# Patient Record
Sex: Male | Born: 1950 | Race: White | Hispanic: No | Marital: Married | Smoking: Former smoker
Health system: Southern US, Community
[De-identification: ages and names within clinical notes are randomized; demographics above are authoritative.]

## PROBLEM LIST (undated history)

## (undated) DIAGNOSIS — I712 Thoracic aortic aneurysm, without rupture, unspecified: Secondary | ICD-10-CM

## (undated) DIAGNOSIS — E785 Hyperlipidemia, unspecified: Secondary | ICD-10-CM

## (undated) DIAGNOSIS — G4730 Sleep apnea, unspecified: Secondary | ICD-10-CM

## (undated) DIAGNOSIS — R600 Localized edema: Secondary | ICD-10-CM

## (undated) DIAGNOSIS — Q899 Congenital malformation, unspecified: Secondary | ICD-10-CM

## (undated) DIAGNOSIS — I10 Essential (primary) hypertension: Secondary | ICD-10-CM

## (undated) DIAGNOSIS — M1990 Unspecified osteoarthritis, unspecified site: Secondary | ICD-10-CM

## (undated) DIAGNOSIS — E669 Obesity, unspecified: Secondary | ICD-10-CM

## (undated) DIAGNOSIS — G4733 Obstructive sleep apnea (adult) (pediatric): Secondary | ICD-10-CM

## (undated) DIAGNOSIS — I77810 Thoracic aortic ectasia: Secondary | ICD-10-CM

## (undated) DIAGNOSIS — T790XXA Air embolism (traumatic), initial encounter: Secondary | ICD-10-CM

## (undated) DIAGNOSIS — I700 Atherosclerosis of aorta: Secondary | ICD-10-CM

## (undated) DIAGNOSIS — K5792 Diverticulitis of intestine, part unspecified, without perforation or abscess without bleeding: Secondary | ICD-10-CM

## (undated) HISTORY — DX: Obesity, unspecified: E66.9

## (undated) HISTORY — DX: Hyperlipidemia, unspecified: E78.5

## (undated) HISTORY — DX: Diverticulitis of intestine, part unspecified, without perforation or abscess without bleeding: K57.92

## (undated) HISTORY — DX: Congenital malformation, unspecified: Q89.9

## (undated) HISTORY — DX: Unspecified osteoarthritis, unspecified site: M19.90

## (undated) HISTORY — DX: Atherosclerosis of aorta: I70.0

## (undated) HISTORY — DX: Sleep apnea, unspecified: G47.30

## (undated) HISTORY — DX: Essential (primary) hypertension: I10

## (undated) HISTORY — PX: COLONOSCOPY: SHX174

## (undated) HISTORY — DX: Thoracic aortic aneurysm, without rupture, unspecified: I71.20

## (undated) HISTORY — PX: BACK SURGERY: SHX140

## (undated) HISTORY — DX: Localized edema: R60.0

## (undated) HISTORY — DX: Obstructive sleep apnea (adult) (pediatric): G47.33

---

## 2006-07-11 ENCOUNTER — Emergency Department: Payer: Self-pay | Admitting: Emergency Medicine

## 2006-07-11 ENCOUNTER — Other Ambulatory Visit: Payer: Self-pay

## 2006-07-13 ENCOUNTER — Ambulatory Visit: Payer: Self-pay | Admitting: Internal Medicine

## 2006-07-16 ENCOUNTER — Ambulatory Visit: Payer: Self-pay | Admitting: Internal Medicine

## 2006-08-20 ENCOUNTER — Ambulatory Visit: Payer: Self-pay | Admitting: Internal Medicine

## 2006-08-24 HISTORY — PX: THORACENTESIS: SHX235

## 2006-08-27 ENCOUNTER — Ambulatory Visit: Payer: Self-pay | Admitting: Internal Medicine

## 2006-08-28 ENCOUNTER — Ambulatory Visit: Payer: Self-pay | Admitting: Internal Medicine

## 2006-08-31 ENCOUNTER — Ambulatory Visit: Payer: Self-pay | Admitting: Internal Medicine

## 2006-09-03 ENCOUNTER — Ambulatory Visit: Payer: Self-pay | Admitting: Cardiology

## 2006-09-03 ENCOUNTER — Ambulatory Visit: Payer: Self-pay | Admitting: Critical Care Medicine

## 2006-09-08 ENCOUNTER — Ambulatory Visit (HOSPITAL_COMMUNITY): Admission: RE | Admit: 2006-09-08 | Discharge: 2006-09-08 | Payer: Self-pay | Admitting: Critical Care Medicine

## 2006-09-08 ENCOUNTER — Encounter (INDEPENDENT_AMBULATORY_CARE_PROVIDER_SITE_OTHER): Payer: Self-pay | Admitting: *Deleted

## 2006-09-21 ENCOUNTER — Ambulatory Visit: Payer: Self-pay | Admitting: Critical Care Medicine

## 2006-09-21 ENCOUNTER — Encounter: Payer: Self-pay | Admitting: Internal Medicine

## 2006-10-02 ENCOUNTER — Encounter: Admission: RE | Admit: 2006-10-02 | Discharge: 2006-10-02 | Payer: Self-pay | Admitting: Thoracic Surgery

## 2006-11-25 ENCOUNTER — Encounter: Admission: RE | Admit: 2006-11-25 | Discharge: 2006-11-25 | Payer: Self-pay | Admitting: Thoracic Surgery

## 2007-01-28 ENCOUNTER — Ambulatory Visit: Payer: Self-pay | Admitting: Internal Medicine

## 2007-02-09 ENCOUNTER — Ambulatory Visit: Payer: Self-pay | Admitting: Gastroenterology

## 2007-02-26 ENCOUNTER — Ambulatory Visit: Payer: Self-pay | Admitting: Gastroenterology

## 2007-02-26 LAB — HM COLONOSCOPY

## 2007-03-09 DIAGNOSIS — I10 Essential (primary) hypertension: Secondary | ICD-10-CM

## 2007-03-09 DIAGNOSIS — K5730 Diverticulosis of large intestine without perforation or abscess without bleeding: Secondary | ICD-10-CM | POA: Insufficient documentation

## 2007-03-09 DIAGNOSIS — E785 Hyperlipidemia, unspecified: Secondary | ICD-10-CM

## 2007-07-15 ENCOUNTER — Telehealth: Payer: Self-pay | Admitting: Family Medicine

## 2007-07-15 ENCOUNTER — Ambulatory Visit: Payer: Self-pay | Admitting: Family Medicine

## 2007-08-02 ENCOUNTER — Ambulatory Visit: Payer: Self-pay | Admitting: Internal Medicine

## 2007-08-31 ENCOUNTER — Telehealth (INDEPENDENT_AMBULATORY_CARE_PROVIDER_SITE_OTHER): Payer: Self-pay | Admitting: *Deleted

## 2008-01-25 ENCOUNTER — Ambulatory Visit: Payer: Self-pay | Admitting: Internal Medicine

## 2008-01-26 ENCOUNTER — Ambulatory Visit: Payer: Self-pay | Admitting: Internal Medicine

## 2008-01-27 ENCOUNTER — Telehealth (INDEPENDENT_AMBULATORY_CARE_PROVIDER_SITE_OTHER): Payer: Self-pay | Admitting: *Deleted

## 2008-01-27 LAB — CONVERTED CEMR LAB
Albumin: 4 g/dL (ref 3.5–5.2)
BUN: 24 mg/dL — ABNORMAL HIGH (ref 6–23)
Chloride: 102 meq/L (ref 96–112)
GFR calc Af Amer: 73 mL/min
GFR calc non Af Amer: 61 mL/min
HDL: 39.5 mg/dL (ref >39.0)
Phosphorus: 3.3 mg/dL (ref 2.3–4.6)
Sodium: 139 meq/L (ref 135–145)
Triglycerides: 116 mg/dL (ref 0–149)
VLDL: 23 mg/dL (ref 0–40)

## 2008-02-16 ENCOUNTER — Encounter: Payer: Self-pay | Admitting: Internal Medicine

## 2008-02-28 ENCOUNTER — Ambulatory Visit: Payer: Self-pay | Admitting: Internal Medicine

## 2008-02-28 LAB — CONVERTED CEMR LAB
ALT: 38 units/L (ref 0–53)
LDL Cholesterol: 139 mg/dL — ABNORMAL HIGH (ref 0–99)
VLDL: 17 mg/dL (ref 0–40)

## 2008-08-01 ENCOUNTER — Ambulatory Visit: Payer: Self-pay | Admitting: Internal Medicine

## 2008-08-01 DIAGNOSIS — G4733 Obstructive sleep apnea (adult) (pediatric): Secondary | ICD-10-CM

## 2009-02-09 ENCOUNTER — Ambulatory Visit: Payer: Self-pay | Admitting: Internal Medicine

## 2009-02-12 LAB — CONVERTED CEMR LAB
Albumin: 4.1 g/dL (ref 3.5–5.2)
Alkaline Phosphatase: 69 units/L (ref 39–117)
Basophils Relative: 1.1 % (ref 0.0–3.0)
Bilirubin, Direct: 0 mg/dL (ref 0.0–0.3)
Eosinophils Absolute: 0.2 10*3/uL (ref 0.0–0.7)
HCT: 43.7 % (ref 39.0–52.0)
Hemoglobin: 15.5 g/dL (ref 13.0–17.0)
LDL Cholesterol: 124 mg/dL — ABNORMAL HIGH (ref 0–99)
MCHC: 35.5 g/dL (ref 30.0–36.0)
MCV: 89.3 fL (ref 78.0–100.0)
Monocytes Absolute: 0.8 10*3/uL (ref 0.1–1.0)
Neutro Abs: 3.3 10*3/uL (ref 1.4–7.7)
PSA: 1.66 ng/mL (ref 0.10–4.00)
RBC: 4.89 M/uL (ref 4.22–5.81)
Total CHOL/HDL Ratio: 5
Triglycerides: 87 mg/dL (ref 0.0–149.0)

## 2009-08-07 ENCOUNTER — Ambulatory Visit: Payer: Self-pay | Admitting: Internal Medicine

## 2010-02-12 ENCOUNTER — Ambulatory Visit: Payer: Self-pay | Admitting: Internal Medicine

## 2010-02-14 LAB — CONVERTED CEMR LAB
BUN: 17 mg/dL (ref 6–23)
Basophils Relative: 1 % (ref 0.0–3.0)
Bilirubin, Direct: 0.1 mg/dL (ref 0.0–0.3)
CO2: 32 meq/L (ref 19–32)
Chloride: 105 meq/L (ref 96–112)
Direct LDL: 149.5 mg/dL
Eosinophils Relative: 3.3 % (ref 0.0–5.0)
GFR calc non Af Amer: 65.95 mL/min (ref >60)
HCT: 46.3 % (ref 39.0–52.0)
HDL: 50.1 mg/dL (ref >39.00)
Hemoglobin: 15.4 g/dL (ref 13.0–17.0)
Lymphs Abs: 1.5 10*3/uL (ref 0.7–4.0)
Monocytes Relative: 10.6 % (ref 3.0–12.0)
Neutro Abs: 4.6 10*3/uL (ref 1.4–7.7)
RBC: 4.95 M/uL (ref 4.22–5.81)
RDW: 12.6 % (ref 11.5–14.6)
Total Bilirubin: 1.1 mg/dL (ref 0.3–1.2)
Total CHOL/HDL Ratio: 4
VLDL: 30 mg/dL (ref 0.0–40.0)

## 2010-08-16 ENCOUNTER — Ambulatory Visit: Payer: Self-pay | Admitting: Internal Medicine

## 2010-12-22 LAB — CONVERTED CEMR LAB
Basophils Absolute: 0.1 10*3/uL (ref 0.0–0.1)
Basophils Relative: 0.8 % (ref 0.0–1.0)
Eosinophils Absolute: 0.2 10*3/uL (ref 0.0–0.6)
Eosinophils Relative: 3.3 % (ref 0.0–5.0)
HCT: 42.3 % (ref 39.0–52.0)
Hemoglobin: 14.2 g/dL (ref 13.0–17.0)
Lymphocytes Relative: 25.1 % (ref 12.0–46.0)
MCHC: 33.6 g/dL (ref 30.0–36.0)
MCV: 90.4 fL (ref 78.0–100.0)
Monocytes Absolute: 0.8 10*3/uL — ABNORMAL HIGH (ref 0.2–0.7)
Monocytes Relative: 10.9 % (ref 3.0–11.0)
Neutro Abs: 4.1 10*3/uL (ref 1.4–7.7)
Neutrophils Relative %: 59.9 % (ref 43.0–77.0)
Platelets: 182 10*3/uL (ref 150–400)
RBC: 4.68 M/uL (ref 4.22–5.81)
RDW: 12.2 % (ref 11.5–14.6)
WBC: 7 10*3/uL (ref 4.5–10.5)

## 2010-12-26 NOTE — Assessment & Plan Note (Signed)
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  Past Medical History: Reviewed history from 08/01/2008 and no changes required. Diverticulosis, colon 02/2007 Hyperlipidemia Hypertension Obstructive sleep apnea  Past Surgical History: Pneumonia (out pt)  06/2006 Left pleural effusion-thoracentesis  08/2006  Family History: Dad has had MI and CABG   ESRD. Stroke 26-Nov-2023 and died Mar 28, 2023 Mom died of renal failure @80 -- had HTN  Prostate cancer --Dad No DM No colon cancer  Social History: Reviewed history from 08/07/2009 and no changes required. 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 Eyes:  Denies double vision and vision loss-1 eye. ENT:  Denies decreased hearing and ringing in ears; teeth okay--regular with  dentist. CV:  Complains of shortness of breath with exertion; denies chest pain or discomfort, difficulty breathing at night, difficulty breathing while lying down, fainting, lightheadness, and palpitations; stamina is down but no acute change. Resp:  Denies cough and shortness of breath. GI:  Denies abdominal pain, bloody stools, change in bowel habits, constipation, dark tarry stools, indigestion, nausea, and vomiting. GU:  Denies erectile dysfunction, urinary frequency, and urinary hesitancy. MS:  See HPI; Denies joint pain and joint swelling; neck improved. Derm:  Denies lesion(s) and rash. Neuro:  See HPI; Complains of numbness and tingling; denies headaches and weakness. Psych:  Denies anxiety and depression. Heme:  Denies abnormal bruising and enlarge lymph nodes. Allergy:  Denies seasonal allergies and sneezing.  Physical Exam  General:  alert and normal appearance.   Eyes:  pupils equal, pupils round, pupils reactive to light, and no optic disk abnormalities.   Ears:  R ear normal and L ear normal.   Mouth:  no erythema and no lesions.   Neck:  supple, no masses, no thyromegaly, no carotid bruits, and no cervical lymphadenopathy.   Lungs:  normal respiratory effort and normal breath sounds.   Heart:  normal rate, regular rhythm, no murmur, and no gallop.   Abdomen:  soft and non-tender.   Rectal:  no hemorrhoids and no masses.   Prostate:  no gland enlargement and no nodules.   Msk:  no joint tenderness and no joint swelling.   Pulses:  1+ in feet Extremities:  no edema  Neurologic:  alert & oriented X3, strength normal in all extremities, and gait normal.   Skin:  no rashes and no suspicious lesions.   Multiple benign nevi and skin tags Axillary Nodes:  No palpable lymphadenopathy Psych:  normally interactive, good eye contact, not anxious appearing, and not depressed appearing.     Impression & Recommendations:  Problem # 1:  PREVENTIVE HEALTH CARE  (ICD-V70.0) Assessment Comment Only due for PSA discussed fitness  Problem # 2:  HYPERTENSION (ICD-401.9) Assessment: Unchanged  good control will check labs  His updated medication list for this problem includes:    Prinzide 10-12.5 Mg Tabs (Lisinopril-hydrochlorothiazide) .Marland Kitchen... Take one tab daily  BP today: 118/60 Prior BP: 110/70 (08/07/2009)  Labs Reviewed: K+: 4.2 (02/09/2009) Creat: : 1.5 (02/09/2009)   Chol: 177 (02/09/2009)   HDL: 35.60 (02/09/2009)   LDL: 124 (02/09/2009)   TG: 87.0 (02/09/2009)  Orders: TLB-Renal Function Panel (80069-RENAL) TLB-CBC Platelet - w/Differential (85025-CBCD) TLB-TSH (Thyroid Stimulating Hormone) (84443-TSH) Venipuncture (16109)  Problem # 3:  HYPERLIPIDEMIA (ICD-272.4) Assessment: Unchanged  no problems with med will continue and check labs  His updated medication list for this problem includes:    Pravastatin Sodium 40 Mg Tabs (Pravastatin sodium) .Marland Kitchen... Take one by mouth daily  Labs Reviewed: SGOT: 22 (02/09/2009)   SGPT: 18 (02/09/2009)   HDL:35.60 (02/09/2009), 42.5 (02/28/2008)  LDL:124 (02/09/2009), 139 (02/28/2008)  Chol:177 (02/09/2009), 198 (02/28/2008)  Trig:87.0 (02/09/2009), 85 (02/28/2008)  Orders: TLB-Lipid Panel (80061-LIPID) TLB-Hepatic/Liver Function Pnl (80076-HEPATIC)  Complete Medication List: 1)  Prinzide 10-12.5 Mg Tabs (Lisinopril-hydrochlorothiazide) .... Take one tab daily 2)  Adult Aspirin Ec Low Strength 81 Mg Tbec (Aspirin) .... Once daily 3)  Pravastatin Sodium 40 Mg Tabs (Pravastatin sodium) .... Take one by mouth daily  Other Orders: TLB-PSA (Prostate Specific Antigen) (84153-PSA)  Patient Instructions: 1)  Please schedule a follow-up appointment in 6 months .  Prescriptions: PRAVASTATIN SODIUM 40 MG TABS (PRAVASTATIN SODIUM) Take one by mouth daily  #30 x 12   Entered by:   Mervin Hack CMA (AAMA)   Authorized by:   Cindee Salt MD   Signed by:   Mervin Hack CMA (AAMA) on  02/12/2010   Method used:   Electronically to        [facility]* (retail)       [address] [place], [place]  [address]       Ph: [phone]       Fax: [phone]   RxID:   [id number]   Current Allergies (reviewed today): No known allergies

## 2010-12-26 NOTE — Assessment & Plan Note (Signed)
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  Past Medical History: Reviewed history from 08/01/2008 and no changes required. Diverticulosis, colon 02/2007 Hyperlipidemia Hypertension Obstructive sleep apnea  Past Surgical History: Reviewed history from 02/12/2010 and no changes required. Pneumonia (out pt)  06/2006 Left pleural effusion-thoracentesis  08/2006  Family History: Reviewed history from 02/12/2010 and no changes required. Dad has had MI and CABG   ESRD. Stroke 11-10-2023 and died 2023/03/12 Mom died of renal failure @80 -- had HTN  Prostate cancer --Dad No DM No colon cancer  Social History: Reviewed history from 08/07/2009 and no changes required. 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   Neck:  supple, no masses, no thyromegaly, no carotid bruits, and no cervical lymphadenopathy.   Lungs:  normal respiratory effort, no intercostal retractions, no accessory muscle use, and normal breath sounds.   Heart:  normal rate, regular rhythm, no murmur, and no gallop.   Msk:  no joint tenderness and no joint swelling.   Extremities:  no edema Psych:  normally interactive, good eye contact, not anxious appearing, and not depressed appearing.     Impression & Recommendations:  Problem # 1:  HYPERTENSION (ICD-401.9) Assessment Unchanged good control no changes needed  His updated medication list for this problem includes:    Prinzide 10-12.5 Mg Tabs (Lisinopril-hydrochlorothiazide) .Marland Kitchen... Take one tab daily  BP today: 126/74 Prior BP: 118/60 (02/12/2010)  Labs Reviewed: K+: 4.4 (02/12/2010) Creat: : 1.2 (02/12/2010)   Chol: 215 (02/12/2010)   HDL: 50.10 (02/12/2010)   LDL: 124 (02/09/2009)   TG: 150.0 (02/12/2010)  Problem # 2:  HYPERLIPIDEMIA (ICD-272.4) Assessment: Unchanged not great but discussed working on lifestyle will recheck at PE  His updated medication list for this problem includes:    Pravastatin Sodium 40 Mg Tabs (Pravastatin sodium) .Marland Kitchen... Take one by mouth daily  Labs Reviewed: SGOT: 21 (02/12/2010)   SGPT: 27 (02/12/2010)   HDL:50.10 (02/12/2010), 35.60 (02/09/2009)  LDL:124 (02/09/2009), 139 (02/28/2008)  Chol:215 (02/12/2010), 177 (02/09/2009)  Trig:150.0 (02/12/2010), 87.0 (02/09/2009)  Problem # 3:  GRIEF REACTION (ICD-309.0) Assessment: Improved better with wife's death but now has to close business discussed his future/social interacitons, etc  Complete Medication List: 1)  Prinzide 10-12.5 Mg Tabs (Lisinopril-hydrochlorothiazide) .... Take one tab daily 2)  Adult Aspirin Ec Low Strength 81 Mg Tbec (Aspirin) .... Once daily 3)  Pravastatin Sodium 40 Mg Tabs (Pravastatin sodium) .... Take one by  mouth daily  Patient Instructions: 1)  Please schedule a follow-up appointment in 6 months for physical  Current Allergies (reviewed today): No known allergies   Appended Document: FOLLOW UP / LFW     Allergies: No Known Drug Allergies   Complete Medication List: 1)  Prinzide 10-12.5 Mg Tabs (Lisinopril-hydrochlorothiazide) .... Take one tab daily 2)  Adult Aspirin Ec Low Strength 81 Mg Tbec (Aspirin) .... Once daily 3)  Pravastatin Sodium 40 Mg Tabs (Pravastatin sodium) .... Take one by mouth daily  Other Orders: Flu Vaccine 87yrs + (04540) Admin 1st Vaccine (98119)    Immunizations Administered:  Influenza Vaccine # 1:    Vaccine Type: Fluvax 3+    Site: left deltoid    Mfr: GlaxoSmithKline    Dose: 0.5 ml    Route: IM    Given by: Linde Gillis CMA (AAMA)    Exp. Date: 05/24/2011    Lot #: [id number]    VIS given: 06/18/10 version given August 16, 2010.

## 2011-02-03 ENCOUNTER — Encounter (INDEPENDENT_AMBULATORY_CARE_PROVIDER_SITE_OTHER): Payer: PRIVATE HEALTH INSURANCE | Admitting: Internal Medicine

## 2011-02-03 ENCOUNTER — Other Ambulatory Visit: Payer: Self-pay | Admitting: Internal Medicine

## 2011-02-03 ENCOUNTER — Encounter: Payer: Self-pay | Admitting: Internal Medicine

## 2011-02-03 DIAGNOSIS — I10 Essential (primary) hypertension: Secondary | ICD-10-CM

## 2011-02-03 DIAGNOSIS — Z0000 Encounter for general adult medical examination without abnormal findings: Secondary | ICD-10-CM

## 2011-02-03 DIAGNOSIS — E785 Hyperlipidemia, unspecified: Secondary | ICD-10-CM

## 2011-02-03 LAB — LIPID PANEL
Cholesterol: 235 mg/dL — ABNORMAL HIGH (ref 0–200)
HDL: 50.7 mg/dL (ref >39.00)
Total CHOL/HDL Ratio: 5
Triglycerides: 94 mg/dL (ref 0.0–149.0)
VLDL: 18.8 mg/dL (ref 0.0–40.0)

## 2011-02-03 LAB — CBC WITH DIFFERENTIAL/PLATELET
Basophils Absolute: 0 10*3/uL (ref 0.0–0.1)
Basophils Relative: 0.6 % (ref 0.0–3.0)
Eosinophils Absolute: 0.2 10*3/uL (ref 0.0–0.7)
Eosinophils Relative: 2.2 % (ref 0.0–5.0)
HCT: 47.7 % (ref 39.0–52.0)
Hemoglobin: 16.5 g/dL (ref 13.0–17.0)
Lymphocytes Relative: 23.6 % (ref 12.0–46.0)
Lymphs Abs: 1.9 10*3/uL (ref 0.7–4.0)
MCHC: 34.7 g/dL (ref 30.0–36.0)
MCV: 91.4 fl (ref 78.0–100.0)
Monocytes Absolute: 0.7 10*3/uL (ref 0.1–1.0)
Monocytes Relative: 8.7 % (ref 3.0–12.0)
Neutro Abs: 5.4 10*3/uL (ref 1.4–7.7)
Neutrophils Relative %: 64.9 % (ref 43.0–77.0)
Platelets: 190 10*3/uL (ref 150.0–400.0)
RBC: 5.22 Mil/uL (ref 4.22–5.81)
RDW: 13.5 % (ref 11.5–14.6)
WBC: 8.3 10*3/uL (ref 4.5–10.5)

## 2011-02-03 LAB — HEPATIC FUNCTION PANEL
ALT: 22 U/L (ref 0–53)
AST: 20 U/L (ref 0–37)
Albumin: 4.6 g/dL (ref 3.5–5.2)
Alkaline Phosphatase: 62 U/L (ref 39–117)
Bilirubin, Direct: 0.2 mg/dL (ref 0.0–0.3)
Total Bilirubin: 0.7 mg/dL (ref 0.3–1.2)
Total Protein: 7.4 g/dL (ref 6.0–8.3)

## 2011-02-03 LAB — RENAL FUNCTION PANEL
Albumin: 4.6 g/dL (ref 3.5–5.2)
BUN: 23 mg/dL (ref 6–23)
CO2: 28 mEq/L (ref 19–32)
Calcium: 9.4 mg/dL (ref 8.4–10.5)
Chloride: 99 mEq/L (ref 96–112)
Creatinine, Ser: 1.1 mg/dL (ref 0.4–1.5)
GFR: 71.91 mL/min (ref >60.00)
Glucose, Bld: 84 mg/dL (ref 70–99)
Phosphorus: 3.7 mg/dL (ref 2.3–4.6)
Potassium: 4.3 mEq/L (ref 3.5–5.1)
Sodium: 136 mEq/L (ref 135–145)

## 2011-02-03 LAB — LDL CHOLESTEROL, DIRECT: Direct LDL: 161.3 mg/dL

## 2011-02-03 LAB — TSH: TSH: 2.08 u[IU]/mL (ref 0.35–5.50)

## 2011-02-11 NOTE — Assessment & Plan Note (Signed)
Summary: CPX/DLO   Vital Signs:  Patient profile:   60 year old male Weight:      243 pounds O2 Sat:      96 % on Room air Temp:     98.4 degrees F oral Pulse rate:   76 / minute Pulse rhythm:   regular BP sitting:   122 / 88  (left arm) Cuff size:   large  Vitals Entered By: Mervin Hack CMA Duncan Dull) (February 03, 2011 11:57 AM)  O2 Flow:  Room air CC: adult physical   History of Present Illness: Has retired Very busy [employer] has been doing one project after another Some consulting work--but not seeking this out  Has cut out fast food lunches more physically active though no set changes in activity  Twisted right knee in November--throwing a box into truck some aching improving slowly  Has sore area above past burn on medial left calf seems better now  occ ankle edema if up a long time  Allergies: No Known Drug Allergies  Past History:  Past medical, surgical, family and social histories (including risk factors) reviewed for relevance to current acute and chronic problems.  Past Medical History: Reviewed history from 08/01/2008 and no changes required. Diverticulosis, colon 02/2007 Hyperlipidemia Hypertension Obstructive sleep apnea  Past Surgical History: Reviewed history from 02/12/2010 and no changes required. Pneumonia (out pt)  06/2006 Left pleural effusion-thoracentesis  08/2006  Family History: Reviewed history from 02/12/2010 and no changes required. Dad has had MI and CABG   ESRD. 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 Eyes:  Denies double vision and vision loss-1 eye. ENT:  Denies decreased hearing and ringing in ears;  teeth okay--every 4 months to dentist. CV:  Denies chest pain or discomfort, difficulty breathing at night, difficulty breathing while lying down, fainting, lightheadness, palpitations, and shortness of breath with exertion. Resp:  Denies cough and shortness of breath. GI:  Complains of indigestion; denies abdominal pain, bloody stools, change in bowel habits, dark tarry stools, nausea, and vomiting; rare stomach trouble if he eats out---?MSG. GU:  Denies erectile dysfunction, nocturia, urinary frequency, and urinary hesitancy; uses condoms. MS:  See HPI; Complains of joint pain; denies joint swelling; neck pain is controlled with monthly chiropractic. Derm:  Denies lesion(s) and rash. Neuro:  Denies headaches, tingling, and weakness; arms will go to sleep easier in bed--positional. Shakes it out easily. Psych:  Denies anxiety and depression; mood is actually better Incredible relief about mill closing . Heme:  Denies abnormal bruising and enlarge lymph nodes. Allergy:  Denies seasonal allergies and sneezing.  Physical Exam  General:  alert and normal appearance.   Eyes:  pupils equal, pupils round, pupils reactive to light, and no injection.   Ears:  R ear normal and L ear normal.   Mouth:  no erythema, no exudates, and no lesions.   Neck:  supple, no masses, no thyromegaly, and no cervical lymphadenopathy.   Lungs:  normal respiratory effort, no intercostal retractions, no accessory muscle use, and normal breath sounds.   Heart:  normal rate, regular rhythm, no murmur, and no gallop.   Abdomen:  soft, non-tender, and no masses.   Prostate:  deferred after discussion Msk:  no joint tenderness and no joint swelling.   Right knee has normal exam Pulses:  1+ in feet Extremities:  no edema Neurologic:  alert & oriented X3, strength normal in all extremities, and gait normal.   Skin:  no rashes and no suspicious lesions.   Axillary Nodes:  No palpable lymphadenopathy Psych:  normally  interactive, good eye contact, not anxious appearing, and not depressed appearing.     Impression & Recommendations:  Problem # 1:  PREVENTIVE HEALTH CARE (ICD-V70.0) Assessment Comment Only doing well with retirement new lady friend so has adjusted to major life changes discussed fitness Defer PSA since under 2 last year not due for colon for some time  Problem # 2:  HYPERTENSION (ICD-401.9) Assessment: Unchanged  good control no changes needed  His updated medication list for this problem includes:    Prinzide 10-12.5 Mg Tabs (Lisinopril-hydrochlorothiazide) .Marland Kitchen... Take one tab daily  BP today: 122/88 Prior BP: 126/74 (08/16/2010)  Labs Reviewed: K+: 4.4 (02/12/2010) Creat: : 1.2 (02/12/2010)   Chol: 215 (02/12/2010)   HDL: 50.10 (02/12/2010)   LDL: 124 (02/09/2009)   TG: 150.0 (02/12/2010)  Orders: TLB-Renal Function Panel (80069-RENAL) TLB-CBC Platelet - w/Differential (85025-CBCD) TLB-TSH (Thyroid Stimulating Hormone) (84443-TSH) Venipuncture (16109)  Problem # 3:  HYPERLIPIDEMIA (ICD-272.4) Assessment: Unchanged  no problems with meds will recheck labs  His updated medication list for this problem includes:    Pravastatin Sodium 40 Mg Tabs (Pravastatin sodium) .Marland Kitchen... Take one by mouth daily  Labs Reviewed: SGOT: 21 (02/12/2010)   SGPT: 27 (02/12/2010)   HDL:50.10 (02/12/2010), 35.60 (02/09/2009)  LDL:124 (02/09/2009), 139 (02/28/2008)  Chol:215 (02/12/2010), 177 (02/09/2009)  Trig:150.0 (02/12/2010), 87.0 (02/09/2009)  Orders: TLB-Lipid Panel (80061-LIPID) TLB-Hepatic/Liver Function Pnl (80076-HEPATIC)  Problem # 4:  OBSTRUCTIVE SLEEP APNEA (ICD-327.23) Assessment: Unchanged doing well on CPAP  Complete Medication List: 1)  Prinzide 10-12.5 Mg Tabs (Lisinopril-hydrochlorothiazide) .... Take one tab daily 2)  Adult Aspirin Ec Low Strength 81 Mg Tbec (Aspirin) .... Once daily 3)  Pravastatin Sodium 40 Mg Tabs (Pravastatin sodium) .... Take one by mouth  daily  Patient Instructions: 1)  Please schedule a follow-up appointment in 6 months .    Orders Added: 1)  TLB-Lipid Panel [80061-LIPID] 2)  TLB-Hepatic/Liver Function Pnl [80076-HEPATIC] 3)  TLB-Renal Function Panel [80069-RENAL] 4)  TLB-CBC Platelet - w/Differential [85025-CBCD] 5)  TLB-TSH (Thyroid Stimulating Hormone) [84443-TSH] 6)  Venipuncture [60454] 7)  Est. Patient 40-64 years [09811]    Current Allergies (reviewed today): No known allergies

## 2011-03-26 ENCOUNTER — Other Ambulatory Visit: Payer: Self-pay | Admitting: *Deleted

## 2011-03-26 MED ORDER — LISINOPRIL-HYDROCHLOROTHIAZIDE 10-12.5 MG PO TABS: 1.0000 | ORAL_TABLET | Freq: Every day | ORAL | Status: DC

## 2011-04-11 NOTE — Assessment & Plan Note (Signed)
[facility]                               PULMONARY OFFICE NOTE   CORGAN, MORMILE                         MRN:          [id number]  DATE:09/03/2006                            DOB:          [birth year]    CONSULTANT:  Charlcie Cradle. Delford Field, M.D., [facility]   CHIEF COMPLAINT:  Evaluate pneumonia.   HISTORY OF THE PRESENT ILLNESS:  This is a 60 year old within who began  having symptoms at the end of August with the acute onset on the right-sided  pleuritic chest pain and increased dyspnea, but no fever, chills or sweats,  and no cough.  The pain was worse with a deep breath.  He was taken to the  emergency department where a chest x-ray showed an infiltrate in the right  lower lobe.  A CT scan of the chest showed no pulmonary emboli.  He was  treated with Levaquin 500 mg a day for 10 days.  He felt somewhat better.  He got an additional three more days of Levaquin per Dr. Alphonsus Sias and was  followed up on August 22, 2006.  Repeat chest x-ray showed clearance of  infiltrate.  He thought he was back to normal, but later that night he  redeveloped pain in the chest in the right lower lung zone.  Another x-ray  was taken showing a persistent infiltrate.  He was given Ketek and another  10 days of Levaquin at 500 mg daily.  He is now finishing those courses of  therapy.  He was also given Vicodin and Darvocet that has helped with the  pain somewhat, but it is still present.  He had to sleep sitting up in a  chair.  He had some fever and some chills with the second round and is now  coughing up some mucous, but swallows this. He is short of breath with  exertion and at rest.  He has some loss of appetite.  No real heartburn or  indigestion.   The patient denies any abdominal pain and difficulty swallowing.  There is  no sore throat, tooth or dental problems.  No nasal congestion, sneezing,  itching, headache, anxiety, depression, nor hand or foot swelling.   The patient did smoke a pack a day of cigarettes and had done so for 40  years, but just quit smoking on July 28, 2006.  He works in Designer, fashion/clothing in  an office type environment.   The patient is referred for further evaluation.   PAST HISTORY:  Medical:  1. History of hypertension.  2. No history of heart disease, heart failure, heart dysrhythmias.  3. No history of asthma, emphysema or diabetes.  4. No has of increased cholesterol, stroke, blood clots, cancer,      allergies, or chronic headaches.  5. The patient does have a history of sleep apnea and is on CPAP therapy.   Operative history:  Back surgery only.   ALLERGIES:  No known drug allergies.   CURRENT MEDICATIONS:  1. Prinizide 1 daily.  2. Aspirin 325 mg daily.  3. The patient is  finishing courses of Ketek and Levaquin.   SOCIAL HISTORY:  Smoking as noted above.  He works in Designer, fashion/clothing, office job.  He is married and has children.   FAMILY HISTORY:  Father had end-stage renal disease on dialysis.  Mother had  heart disease as did his father.  Also positive for phlebitis 20 years ago  for the patient and clotting disorder for his father.   REVIEW OF SYSTEMS:  The review of systems is otherwise noncontributory.   PHYSICAL EXAMINATION:  GENERAL APPEARANCE:  This is an obese white male in  no distress.  VITAL SIGNS:  Temp 97.8, blood pressure 130/80, pulse 74 and saturation 95%  on room air.  Weight is 267 pounds.  CHEST:  The chest is shown to be clear, except for rales and decreased  breath sounds.  Consolidated breath sounds with E to A changes in the right  lower lung particularly anteriorly.  No pleural friction rub is detected.  No wheeze or rhonchi noted.  HEART:  Cardiac exam shows a regular rate and rhythm without S3.  Normal S1  and S2.  There is no murmur, rub, heave or gallop.  ABDOMEN:  The abdomen is soft and nontender.  EXTREMITIES:  The extremities show no edema or clubbing.  SKIN:  The skin is  clear.  NEUROLOGIC:  The neurological exam is intact.  HEENT AND NECK:  Head, eyes, ears, nose and throat, neck exam shows no  jugular venous distention and no lymphadenopathy.  Oropharynx is clear.  The  neck is supple.   LABORATORY DATA:  Chest x-ray from recently on August 21, 2006 showed  clearance of infiltrate, but a follow up film on August 27, 2006 did show  recurrent infiltrate in the right lower lung zone. CT scan from July 21, 2006 showed right lower lobe anteriorly based infiltrated, but no pulmonary  emboli, and with atelectasis involving the medial segment, right middle lobe  and right lower lobe.  There is also some minimal atelectasis present in the  left costophrenic angle.  There is a trace right pleural effusion noted.   IMPRESSION:  Recurrent pneumonia, right middle and lower lung zones.  This patient may have been treated with a subtherapeutic dose of Levaquin at  a 500 mg dose level; however, with the smoking history this may explain why  the condition is prolonged.  I doubt there is any bronchial lesion here;  need to rule out pulmonary emboli.   RECOMMENDATIONS:  1. Obtain a CT scan of the chest with pulmonary emboli protocol and also      evaluate for evidence of pleural effusion.  2. Switch over to Factive 320 mg for a 10-day course after the course of      Ketek and Levaquin are complete.  3. Vicodin was refilled.  4. Once the CT scan report is available further recommendations may follow      and my include bronchoscopy.       Charlcie Cradle Delford Field, MD, [facility]      PEW/MedQ  DD:  09/03/2006  DT:  09/05/2006  Job #:  [id number]   cc:   Karie Schwalbe, MD

## 2011-04-11 NOTE — Assessment & Plan Note (Signed)
[facility]                               PULMONARY OFFICE NOTE   RITO, LECOMTE                         MRN:          [id number]  DATE:09/21/2006                            DOB:          [birth year]    Mr. Nicoson returns today in followup. He is a 60 year old white male with a  history of right-sided pleuritic chest pain with right-sided pneumonia and  right pleural effusion. We attempted a thoracentesis on this patient under  ultrasound guidance and this was performed on September 08, 2006, the results  showing 200 mL of serosanguineous fluid aspirated, post thoracentesis films  showed persistent right pleural effusion. The patient still is having some  chest discomfort and shortness of breath but his fever is improved and his  sharp stabbing chest pain is slightly better. He is only on the Prinizide 1  daily and aspirin 325 mg daily.   PHYSICAL EXAMINATION:  VITAL SIGNS:  Temperature 98, blood pressure 120/88,  pulse 78, saturation 98% on room air.  CHEST:  Showed decreased breath sounds and dull to percussion halfway up on  the right.   IMPRESSION:  Loculated right pleural effusion likely parapneumonic in  nature. Pleural fluid studies did reveal exudative effusion and atypical  cells on cytology. I would recommend that this patient undergo video-  assisted thoracoscopic surgery for complete drainage of the right pleural  effusion with chest tube placements. We referred this patient to Dr. Karle Plumber in this regard. We will follow the patient up expectantly with Dr.  Edwyna Shell.     Charlcie Cradle Delford Field, MD, [facility]    PEW/MedQ  DD: 09/21/2006  DT: 09/22/2006  Job #: [id number]   cc:   Ines Bloomer, M.D.  Karie Schwalbe, MD

## 2011-06-05 ENCOUNTER — Other Ambulatory Visit: Payer: Self-pay | Admitting: *Deleted

## 2011-06-05 MED ORDER — PRAVASTATIN SODIUM 40 MG PO TABS: 40.0000 mg | ORAL_TABLET | Freq: Every day | ORAL | Status: DC

## 2011-08-05 ENCOUNTER — Encounter: Payer: Self-pay | Admitting: Internal Medicine

## 2011-08-06 ENCOUNTER — Ambulatory Visit (INDEPENDENT_AMBULATORY_CARE_PROVIDER_SITE_OTHER): Payer: PRIVATE HEALTH INSURANCE | Admitting: Internal Medicine

## 2011-08-06 ENCOUNTER — Encounter: Payer: Self-pay | Admitting: Internal Medicine

## 2011-08-06 VITALS — BP 117/75 | HR 68 | Temp 98.6°F | Ht 74.0 in | Wt 249.0 lb

## 2011-08-06 DIAGNOSIS — Z23 Encounter for immunization: Secondary | ICD-10-CM

## 2011-08-06 DIAGNOSIS — G4733 Obstructive sleep apnea (adult) (pediatric): Secondary | ICD-10-CM

## 2011-08-06 DIAGNOSIS — I10 Essential (primary) hypertension: Secondary | ICD-10-CM

## 2011-08-06 DIAGNOSIS — E785 Hyperlipidemia, unspecified: Secondary | ICD-10-CM

## 2011-08-06 MED ORDER — ATORVASTATIN CALCIUM 40 MG PO TABS: 40.0000 mg | ORAL_TABLET | Freq: Every day | ORAL | Status: DC

## 2011-08-06 NOTE — Patient Instructions (Signed)
Please set up lipid, hepatic profiles here in about 6 weeks (272.4)

## 2011-08-06 NOTE — Assessment & Plan Note (Signed)
Doing well on CPAP 

## 2011-08-06 NOTE — Progress Notes (Signed)
  Subjective:    Patient ID: TIN ENGRAM, male    DOB: February 08, 1951, 60 y.o.   MRN: [id number]  HPI DOing well Still enjoying new life---still refinishing furniture (but hard in the summer in his hot workshop)  Has been playing golf Some yard work No set exercise Did gain some weight---hasn't been careful with eating (weddings, conventions, etc)  Still on statin No myalgias, GI disturbances Reviewed suboptimal results  Doesn't check BP No headaches No chest pain, SOB Does occ get some left ankle swelling if on his feet all day (normal in AM). Has used support socks  Sleeps well Still using CPAP May need new machine --he has 35,000 hours on his Not sure about his pressure setting  Current Outpatient Prescriptions on File Prior to Visit  Medication Sig Dispense Refill  . aspirin 81 MG tablet Take 81 mg by mouth daily.        Marland Kitchen lisinopril-hydrochlorothiazide (PRINZIDE,ZESTORETIC) 10-12.5 MG per tablet Take 1 tablet by mouth daily.  30 tablet  11  . pravastatin (PRAVACHOL) 40 MG tablet Take 1 tablet (40 mg total) by mouth daily.  30 tablet  8    No Known Allergies  Past Medical History  Diagnosis Date  . Diverticulitis   . Hyperlipidemia   . Hypertension   . OSA (obstructive sleep apnea)     Past Surgical History  Procedure Date  . Thoracentesis 08/2006    left pleural effusion    Family History  Problem Relation Age of Onset  . Hypertension Mother   . Heart disease Father   . Stroke Father   . Cancer Father   . Diabetes Neg Hx     History   Social History  . Marital Status: Married    Spouse Name: N/A    Number of Children: 2  . Years of Education: N/A   Occupational History  . retired- part Restaurant manager, fast food    Social History Main Topics  . Smoking status: Former Smoker    Types: Cigarettes  . Smokeless tobacco: Never Used  . Alcohol Use: Yes  . Drug Use: No  . Sexually Active: Not on file   Other Topics Concern  . Not on file   Social  History Narrative  . No narrative on file   Review of Systems Has been happy Appetite is good    Objective:   Physical Exam  Constitutional: He appears well-developed and well-nourished. No distress.  Neck: Normal range of motion. Neck supple. No thyromegaly present.  Cardiovascular: Normal rate, regular rhythm, normal heart sounds and intact distal pulses.  Exam reveals no gallop.   No murmur heard. Pulmonary/Chest: Effort normal and breath sounds normal. No respiratory distress. He has no wheezes. He has no rales.  Musculoskeletal: Normal range of motion. He exhibits no edema and no tenderness.  Lymphadenopathy:    He has no cervical adenopathy.  Psychiatric: He has a normal mood and affect. His behavior is normal. Judgment and thought content normal.          Assessment & Plan:

## 2011-08-06 NOTE — Assessment & Plan Note (Signed)
BP Readings from Last 3 Encounters:  08/06/11 117/75  02/03/11 122/88  08/16/10 126/74   Good control  No changes needed Lab Results  Component Value Date   CREATININE 1.1 02/03/2011

## 2011-08-06 NOTE — Assessment & Plan Note (Signed)
Lab Results  Component Value Date   LDLCALC 124* 02/09/2009   Last actual LDL ~170 Will change to atorvastatin and check labs in 6 weeks

## 2011-09-17 ENCOUNTER — Other Ambulatory Visit (INDEPENDENT_AMBULATORY_CARE_PROVIDER_SITE_OTHER): Payer: PRIVATE HEALTH INSURANCE

## 2011-09-17 DIAGNOSIS — Z125 Encounter for screening for malignant neoplasm of prostate: Secondary | ICD-10-CM

## 2011-09-17 DIAGNOSIS — E785 Hyperlipidemia, unspecified: Secondary | ICD-10-CM

## 2011-09-17 DIAGNOSIS — I10 Essential (primary) hypertension: Secondary | ICD-10-CM

## 2011-09-17 LAB — HEPATIC FUNCTION PANEL
ALT: 38 U/L (ref 0–53)
AST: 31 U/L (ref 0–37)
Alkaline Phosphatase: 62 U/L (ref 39–117)
Bilirubin, Direct: 0.1 mg/dL (ref 0.0–0.3)
Total Bilirubin: 0.6 mg/dL (ref 0.3–1.2)
Total Protein: 7.2 g/dL (ref 6.0–8.3)

## 2011-09-17 LAB — LIPID PANEL
Cholesterol: 177 mg/dL (ref 0–200)
VLDL: 10.4 mg/dL (ref 0.0–40.0)

## 2011-09-17 LAB — CBC WITH DIFFERENTIAL/PLATELET
Basophils Absolute: 0.1 10*3/uL (ref 0.0–0.1)
Basophils Relative: 0.9 % (ref 0.0–3.0)
Eosinophils Absolute: 0.4 10*3/uL (ref 0.0–0.7)
Lymphocytes Relative: 27.6 % (ref 12.0–46.0)
MCHC: 34.5 g/dL (ref 30.0–36.0)
MCV: 91.7 fl (ref 78.0–100.0)
Monocytes Absolute: 0.7 10*3/uL (ref 0.1–1.0)
Neutrophils Relative %: 58.5 % (ref 43.0–77.0)
Platelets: 203 10*3/uL (ref 150.0–400.0)
RBC: 4.82 Mil/uL (ref 4.22–5.81)
RDW: 13.4 % (ref 11.5–14.6)

## 2011-09-17 LAB — TSH: TSH: 1.89 u[IU]/mL (ref 0.35–5.50)

## 2011-09-17 LAB — BASIC METABOLIC PANEL
BUN: 27 mg/dL — ABNORMAL HIGH (ref 6–23)
Chloride: 107 mEq/L (ref 96–112)
GFR: 78.23 mL/min (ref >60.00)
Potassium: 4.7 mEq/L (ref 3.5–5.1)

## 2011-09-17 LAB — PSA: PSA: 1.78 ng/mL (ref 0.10–4.00)

## 2011-09-17 NOTE — Progress Notes (Signed)
Addended by: Alvina Chou on: 09/17/2011 08:47 AM   Modules accepted: Orders

## 2011-12-16 ENCOUNTER — Telehealth: Payer: Self-pay | Admitting: Internal Medicine

## 2011-12-16 ENCOUNTER — Encounter: Payer: Self-pay | Admitting: Internal Medicine

## 2011-12-16 ENCOUNTER — Ambulatory Visit (INDEPENDENT_AMBULATORY_CARE_PROVIDER_SITE_OTHER): Payer: PRIVATE HEALTH INSURANCE | Admitting: Internal Medicine

## 2011-12-16 VITALS — BP 110/80 | HR 66 | Temp 98.2°F | Ht 74.0 in | Wt 261.0 lb

## 2011-12-16 DIAGNOSIS — J209 Acute bronchitis, unspecified: Secondary | ICD-10-CM | POA: Insufficient documentation

## 2011-12-16 MED ORDER — HYDROCODONE-HOMATROPINE 5-1.5 MG PO TABS: 1.0000 | ORAL_TABLET | Freq: Every evening | ORAL | Status: DC | PRN

## 2011-12-16 MED ORDER — AMOXICILLIN 500 MG PO TABS: 1000.0000 mg | ORAL_TABLET | Freq: Two times a day (BID) | ORAL | Status: AC

## 2011-12-16 NOTE — Assessment & Plan Note (Signed)
Given time course, it is possible he has secondary bacterial infection---though not clear cut Will Rx amoxil and cough med

## 2011-12-16 NOTE — Progress Notes (Signed)
  Subjective:    Patient ID: Russell Garrett, male    DOB: 1951-08-12, 61 y.o.   MRN: [id number]  HPI "I feel like hell" Has been sick for 2 weeks Dry cough---keeping him from sleep Not much congestion or drainage Only slight sore throat  Had sense last week "like someone sitting on my chest" Still more in his chest than head No fever No SOB No ear pain  Used mucinex and other OTC meds---no help nyquil not even helping sleep  Current Outpatient Prescriptions on File Prior to Visit  Medication Sig Dispense Refill  . aspirin 81 MG tablet Take 81 mg by mouth daily.        Marland Kitchen atorvastatin (LIPITOR) 40 MG tablet Take 1 tablet (40 mg total) by mouth daily.  30 tablet  11  . lisinopril-hydrochlorothiazide (PRINZIDE,ZESTORETIC) 10-12.5 MG per tablet Take 1 tablet by mouth daily.  30 tablet  11    No Known Allergies  Past Medical History  Diagnosis Date  . Diverticulitis   . Hyperlipidemia   . Hypertension   . OSA (obstructive sleep apnea)     Past Surgical History  Procedure Date  . Thoracentesis 08/2006    left pleural effusion    Family History  Problem Relation Age of Onset  . Hypertension Mother   . Heart disease Father   . Stroke Father   . Cancer Father   . Diabetes Neg Hx     History   Social History  . Marital Status: Married    Spouse Name: N/A    Number of Children: 2  . Years of Education: N/A   Occupational History  . retired- part Restaurant manager, fast food    Social History Main Topics  . Smoking status: Former Smoker    Types: Cigarettes  . Smokeless tobacco: Never Used  . Alcohol Use: Yes  . Drug Use: No  . Sexually Active: Not on file   Other Topics Concern  . Not on file   Social History Narrative  . No narrative on file   Review of Systems No vomiting or diarrhea (though slightly loose) Appetite fair No rash     Objective:   Physical Exam  Constitutional: He appears well-developed and well-nourished. No distress.  HENT:       No  sinus tenderness Moderate nasal inflammation Very slight pharyngeal injection TMs normal  Neck: Normal range of motion. Neck supple.  Pulmonary/Chest: Effort normal and breath sounds normal. No respiratory distress. He has no wheezes. He has no rales.  Lymphadenopathy:    He has no cervical adenopathy.          Assessment & Plan:

## 2011-12-16 NOTE — Telephone Encounter (Signed)
Triage Record Num: [id number] Operator: Geanie Berlin Patient Name: Russell Garrett Call Date & Time: 12/16/2011 9:34:57AM Patient Phone: [phone] PCP: Tillman Abide Patient Gender: Male PCP Fax : [phone] Patient DOB: [birth year] Practice Name: [facility] Reason for Call: Caller: Rich/ RPh at [facility]; PCP: Tillman Abide I.; CB#: [phone]; Call regarding Medication Issue; Asking to substitute Hycodan tablets to Hycodan syrup, disp 150 cc; sig: 1-2 tsp po hs prn cough; per RPh, this substitution is the exact equilivant to RX for tablet strength & dose. Pharmacy does not have the tablets; pt prefers the syrup. Approved change in form of med per MD protocol for caller with med question that was answered with available resources per Med Question Call Guideline. Protocol(s) Used: Medication Questions - Adult Recommended Outcome per Protocol: Provided Health Information Override Outcome if Used in Protocol: Information Noted and Sent to Office RN Reason for Override Outcome: Rx Standing Orders Used. Reason for Outcome: Caller has medication question(s) that was answered with available resources Care Advice: ~ 12/16/2011 9:45:01AM Page 1 of 1 [email or web address]

## 2011-12-16 NOTE — Telephone Encounter (Signed)
Okay to change to the syrup

## 2011-12-16 NOTE — Telephone Encounter (Signed)
Spoke with pharmacist and advised results   

## 2012-02-02 ENCOUNTER — Encounter: Payer: PRIVATE HEALTH INSURANCE | Admitting: Internal Medicine

## 2012-02-16 ENCOUNTER — Encounter: Payer: Self-pay | Admitting: Internal Medicine

## 2012-02-16 ENCOUNTER — Ambulatory Visit (INDEPENDENT_AMBULATORY_CARE_PROVIDER_SITE_OTHER): Payer: PRIVATE HEALTH INSURANCE | Admitting: Internal Medicine

## 2012-02-16 VITALS — BP 132/80 | HR 77 | Temp 98.6°F | Ht 74.0 in | Wt 256.0 lb

## 2012-02-16 DIAGNOSIS — I10 Essential (primary) hypertension: Secondary | ICD-10-CM

## 2012-02-16 DIAGNOSIS — E785 Hyperlipidemia, unspecified: Secondary | ICD-10-CM

## 2012-02-16 DIAGNOSIS — Z0000 Encounter for general adult medical examination without abnormal findings: Secondary | ICD-10-CM | POA: Insufficient documentation

## 2012-02-16 MED ORDER — LOSARTAN POTASSIUM-HCTZ 50-12.5 MG PO TABS: 1.0000 | ORAL_TABLET | Freq: Every day | ORAL | Status: DC

## 2012-02-16 NOTE — Progress Notes (Signed)
Subjective:    Patient ID: Russell Garrett, male    DOB: 1951-05-16, 61 y.o.   MRN: [id number]  HPI Doing okay in general Still has a mild cough Not sick ?ACEI cough  Still on CPAP Sleeps well on this Pressure set at 9  Has been walking 2 miles a week 3 times a week Walks live in girlfriend's dog multiple times daily May get married (dealing with her ex)  Current Outpatient Prescriptions on File Prior to Visit  Medication Sig Dispense Refill  . aspirin 81 MG tablet Take 81 mg by mouth daily.        Marland Kitchen atorvastatin (LIPITOR) 40 MG tablet Take 1 tablet (40 mg total) by mouth daily.  30 tablet  11  . lisinopril-hydrochlorothiazide (PRINZIDE,ZESTORETIC) 10-12.5 MG per tablet Take 1 tablet by mouth daily.  30 tablet  11    No Known Allergies  Past Medical History  Diagnosis Date  . Diverticulitis   . Hyperlipidemia   . Hypertension   . OSA (obstructive sleep apnea)     Past Surgical History  Procedure Date  . Thoracentesis 08/2006    left pleural effusion    Family History  Problem Relation Age of Onset  . Hypertension Mother   . Heart disease Father   . Stroke Father   . Cancer Father   . Diabetes Neg Hx     History   Social History  . Marital Status: Married    Spouse Name: N/A    Number of Children: 2  . Years of Education: N/A   Occupational History  . retired- part Restaurant manager, fast food    Social History Main Topics  . Smoking status: Former Smoker    Types: Cigarettes  . Smokeless tobacco: Never Used  . Alcohol Use: Yes  . Drug Use: No  . Sexually Active: Not on file   Other Topics Concern  . Not on file   Social History Narrative  . No narrative on file   Review of Systems  Constitutional: Negative for fatigue and unexpected weight change.       Wears seat  belt  HENT: Negative for hearing loss, congestion, rhinorrhea, dental problem and tinnitus.        No regular allergy symptoms Regular with dentist  Eyes: Negative for visual  disturbance.       No diplopia or unilateral vision loss  Respiratory: Positive for cough. Negative for chest tightness and shortness of breath.   Cardiovascular: Positive for leg swelling. Negative for chest pain and palpitations.       Legs swell if he is on his feet a lot---better in AM  Gastrointestinal: Negative for nausea, vomiting, abdominal pain, constipation and blood in stool.       Occ indigestion with pizza, etc. Rolaids relieves  Genitourinary: Negative for urgency, frequency and difficulty urinating.       No sexual problems  Musculoskeletal: Negative for back pain, joint swelling and arthralgias.  Skin: Negative for rash.       No suspicious lesions  Neurological: Negative for dizziness, syncope, weakness, light-headedness and headaches.  Hematological: Negative for adenopathy. Does not bruise/bleed easily.  Psychiatric/Behavioral: Negative for sleep disturbance and dysphoric mood. The patient is not nervous/anxious.        Objective:   Physical Exam  Constitutional: He is oriented to person, place, and time. He appears well-developed and well-nourished. No distress.  HENT:  Head: Normocephalic and atraumatic.  Right Ear: External ear normal.  Left  Ear: External ear normal.  Mouth/Throat: Oropharynx is clear and moist. No oropharyngeal exudate.  Eyes: Conjunctivae and EOM are normal. Pupils are equal, round, and reactive to light.  Neck: Normal range of motion. Neck supple. No thyromegaly present.  Cardiovascular: Normal rate, regular rhythm, normal heart sounds and intact distal pulses.  Exam reveals no gallop.   No murmur heard. Pulmonary/Chest: Effort normal and breath sounds normal. No respiratory distress. He has no wheezes. He has no rales.  Abdominal: Soft. There is no tenderness.  Musculoskeletal: Normal range of motion. He exhibits edema. He exhibits no tenderness.       Mild left ankle edema  Lymphadenopathy:    He has no cervical adenopathy.    Neurological: He is alert and oriented to person, place, and time.  Skin: No rash noted. No erythema.  Psychiatric: He has a normal mood and affect. His behavior is normal. Judgment and thought content normal.          Assessment & Plan:

## 2012-02-16 NOTE — Assessment & Plan Note (Signed)
BP Readings from Last 3 Encounters:  02/16/12 132/80  12/16/11 110/80  08/06/11 117/75   Good control Has lisinopril cough---will change to losartan

## 2012-02-16 NOTE — Assessment & Plan Note (Signed)
Lab Results  Component Value Date   LDLCALC 119* 09/17/2011   Better on meds but still not at goal Will work on fitness

## 2012-02-16 NOTE — Assessment & Plan Note (Signed)
Healthy but needs to increase fitness efforts He will look into zostavax UTD on cancer screening

## 2012-02-16 NOTE — Patient Instructions (Addendum)
Please check if your insurance covers the zostavax (shingles vaccine)  Please set up blood work in about 1 month (met b---401.9)

## 2012-03-18 ENCOUNTER — Other Ambulatory Visit (INDEPENDENT_AMBULATORY_CARE_PROVIDER_SITE_OTHER): Payer: PRIVATE HEALTH INSURANCE

## 2012-03-18 ENCOUNTER — Encounter: Payer: Self-pay | Admitting: *Deleted

## 2012-03-18 DIAGNOSIS — I10 Essential (primary) hypertension: Secondary | ICD-10-CM

## 2012-03-18 LAB — BASIC METABOLIC PANEL
CO2: 26 mEq/L (ref 19–32)
Calcium: 8.9 mg/dL (ref 8.4–10.5)
Chloride: 109 mEq/L (ref 96–112)
Creatinine, Ser: 1 mg/dL (ref 0.4–1.5)
Sodium: 144 mEq/L (ref 135–145)

## 2012-08-18 ENCOUNTER — Encounter: Payer: Self-pay | Admitting: Internal Medicine

## 2012-08-18 ENCOUNTER — Ambulatory Visit (INDEPENDENT_AMBULATORY_CARE_PROVIDER_SITE_OTHER): Payer: PRIVATE HEALTH INSURANCE | Admitting: Internal Medicine

## 2012-08-18 VITALS — BP 130/88 | HR 53 | Temp 97.9°F | Ht 73.5 in | Wt 251.0 lb

## 2012-08-18 DIAGNOSIS — E785 Hyperlipidemia, unspecified: Secondary | ICD-10-CM

## 2012-08-18 DIAGNOSIS — I10 Essential (primary) hypertension: Secondary | ICD-10-CM

## 2012-08-18 DIAGNOSIS — Z23 Encounter for immunization: Secondary | ICD-10-CM

## 2012-08-18 MED ORDER — ATORVASTATIN CALCIUM 40 MG PO TABS: 40.0000 mg | ORAL_TABLET | Freq: Every day | ORAL | Status: DC

## 2012-08-18 NOTE — Addendum Note (Signed)
Addended by: Sueanne Margarita on: 08/18/2012 08:53 AM   Modules accepted: Orders

## 2012-08-18 NOTE — Progress Notes (Signed)
  Subjective:    Patient ID: Russell Garrett, male    DOB: 12-10-1950, 61 y.o.   MRN: [id number]  HPI Doing well Enjoying retirement Just in [place] for girlfriend's daughter's marriage (she lives out there)  Trying to stay in shape Working out on [employer]  No chest pain No dyspnea No edema  Current Outpatient Prescriptions on File Prior to Visit  Medication Sig Dispense Refill  . aspirin 81 MG tablet Take 81 mg by mouth daily.        Marland Kitchen losartan-hydrochlorothiazide (HYZAAR) 50-12.5 MG per tablet Take 1 tablet by mouth daily.  30 tablet  11  . DISCONTD: atorvastatin (LIPITOR) 40 MG tablet Take 1 tablet (40 mg total) by mouth daily.  30 tablet  11  . DISCONTD: lisinopril-hydrochlorothiazide (PRINZIDE,ZESTORETIC) 10-12.5 MG per tablet Take 1 tablet by mouth daily.  30 tablet  11    No Known Allergies  Past Medical History  Diagnosis Date  . Diverticulitis   . Hyperlipidemia   . Hypertension   . OSA (obstructive sleep apnea)     Past Surgical History  Procedure Date  . Thoracentesis 08/2006    left pleural effusion    Family History  Problem Relation Age of Onset  . Hypertension Mother   . Heart disease Father   . Stroke Father   . Cancer Father   . Diabetes Neg Hx     History   Social History  . Marital Status: Married    Spouse Name: N/A    Number of Children: 2  . Years of Education: N/A   Occupational History  . retired- part Restaurant manager, fast food    Social History Main Topics  . Smoking status: Former Smoker    Types: Cigarettes  . Smokeless tobacco: Never Used  . Alcohol Use: Yes  . Drug Use: No  . Sexually Active: Not on file   Other Topics Concern  . Not on file   Social History Narrative  . No narrative on file   Review of Systems Weight is down a few pounds    Objective:   Physical Exam  Constitutional: He appears well-developed and well-nourished. No distress.  Neck: Normal range of motion. Neck supple. No  thyromegaly present.  Cardiovascular: Normal rate, regular rhythm, normal heart sounds and intact distal pulses.  Exam reveals no gallop.   No murmur heard. Pulmonary/Chest: Effort normal and breath sounds normal. No respiratory distress. He has no wheezes. He has no rales.  Musculoskeletal: He exhibits no edema and no tenderness.  Lymphadenopathy:    He has no cervical adenopathy.  Psychiatric: He has a normal mood and affect. His behavior is normal.          Assessment & Plan:

## 2012-08-18 NOTE — Assessment & Plan Note (Signed)
BP Readings from Last 3 Encounters:  08/18/12 130/88  02/16/12 132/80  12/16/11 110/80   Good control Continues to work on fitness No changes

## 2012-08-18 NOTE — Assessment & Plan Note (Signed)
No problems on the med Lab Results  Component Value Date   LDLCALC 119* 09/17/2011

## 2013-03-02 ENCOUNTER — Encounter: Payer: Self-pay | Admitting: Internal Medicine

## 2013-03-02 ENCOUNTER — Ambulatory Visit (INDEPENDENT_AMBULATORY_CARE_PROVIDER_SITE_OTHER): Payer: BC Managed Care – PPO | Admitting: Internal Medicine

## 2013-03-02 VITALS — BP 120/80 | HR 64 | Temp 97.9°F | Ht 74.0 in | Wt 247.0 lb

## 2013-03-02 DIAGNOSIS — Z125 Encounter for screening for malignant neoplasm of prostate: Secondary | ICD-10-CM

## 2013-03-02 DIAGNOSIS — Z0000 Encounter for general adult medical examination without abnormal findings: Secondary | ICD-10-CM

## 2013-03-02 DIAGNOSIS — E785 Hyperlipidemia, unspecified: Secondary | ICD-10-CM

## 2013-03-02 DIAGNOSIS — I10 Essential (primary) hypertension: Secondary | ICD-10-CM

## 2013-03-02 LAB — CBC WITH DIFFERENTIAL/PLATELET
Basophils Absolute: 0 10*3/uL (ref 0.0–0.1)
Eosinophils Absolute: 0.2 10*3/uL (ref 0.0–0.7)
HCT: 45.6 % (ref 39.0–52.0)
Lymphs Abs: 1.4 10*3/uL (ref 0.7–4.0)
MCV: 90.7 fl (ref 78.0–100.0)
Monocytes Absolute: 0.6 10*3/uL (ref 0.1–1.0)
Neutrophils Relative %: 57.7 % (ref 43.0–77.0)
Platelets: 183 10*3/uL (ref 150.0–400.0)
RDW: 13.6 % (ref 11.5–14.6)

## 2013-03-02 LAB — LIPID PANEL
Cholesterol: 150 mg/dL (ref 0–200)
HDL: 46.7 mg/dL (ref >39.00)
LDL Cholesterol: 93 mg/dL (ref 0–99)
Triglycerides: 51 mg/dL (ref 0.0–149.0)

## 2013-03-02 LAB — HEPATIC FUNCTION PANEL
ALT: 29 U/L (ref 0–53)
AST: 30 U/L (ref 0–37)
Total Bilirubin: 1 mg/dL (ref 0.3–1.2)
Total Protein: 7.4 g/dL (ref 6.0–8.3)

## 2013-03-02 LAB — TSH: TSH: 2.2 u[IU]/mL (ref 0.35–5.50)

## 2013-03-02 LAB — BASIC METABOLIC PANEL
BUN: 19 mg/dL (ref 6–23)
Chloride: 104 mEq/L (ref 96–112)
Creatinine, Ser: 1 mg/dL (ref 0.4–1.5)
Glucose, Bld: 93 mg/dL (ref 70–99)
Potassium: 4.5 mEq/L (ref 3.5–5.1)

## 2013-03-02 MED ORDER — LOSARTAN POTASSIUM-HCTZ 50-12.5 MG PO TABS: 1.0000 | ORAL_TABLET | Freq: Every day | ORAL | Status: DC

## 2013-03-02 NOTE — Assessment & Plan Note (Signed)
Generally healthy Will check PSA after discussion Rx for zostavax Discussed fitness

## 2013-03-02 NOTE — Progress Notes (Signed)
Subjective:    Patient ID: Russell Garrett, male    DOB: 11-20-1951, 62 y.o.   MRN: [id number]  HPI Here for physical No new concerns  Staying busy Enjoys golf, now working on 60-[place] trees down on his property Still tries to be careful with eating Uses elliptical machine  Engaged---getting remarried in June  Current Outpatient Prescriptions on File Prior to Visit  Medication Sig Dispense Refill  . aspirin 81 MG tablet Take 81 mg by mouth daily.        Marland Kitchen atorvastatin (LIPITOR) 40 MG tablet Take 1 tablet (40 mg total) by mouth daily.  30 tablet  11  . [DISCONTINUED] lisinopril-hydrochlorothiazide (PRINZIDE,ZESTORETIC) 10-12.5 MG per tablet Take 1 tablet by mouth daily.  30 tablet  11   No current facility-administered medications on file prior to visit.    No Known Allergies  Past Medical History  Diagnosis Date  . Diverticulitis   . Hyperlipidemia   . Hypertension   . OSA (obstructive sleep apnea)     Past Surgical History  Procedure Laterality Date  . Thoracentesis  08/2006    left pleural effusion    Family History  Problem Relation Age of Onset  . Hypertension Mother   . Heart disease Father   . Stroke Father   . Cancer Father   . Diabetes Neg Hx     History   Social History  . Marital Status: Married    Spouse Name: N/A    Number of Children: 2  . Years of Education: N/A   Occupational History  . retired- part Restaurant manager, fast food    Social History Main Topics  . Smoking status: Former Smoker    Types: Cigarettes  . Smokeless tobacco: Never Used  . Alcohol Use: Yes  . Drug Use: No  . Sexually Active: Not on file   Other Topics Concern  . Not on file   Social History Narrative  . No narrative on file   Review of Systems  Constitutional: Negative for fatigue and unexpected weight change.       Wears seat belt  HENT: Positive for congestion and rhinorrhea. Negative for hearing loss, dental problem and tinnitus.        Mild allergy  symptoms---hasn't needed meds Regular with dentist  Eyes: Negative for visual disturbance.       No diplopia or unilateral vision loss  Respiratory: Positive for cough. Negative for chest tightness and shortness of breath.        Cough related to allergies?  Cardiovascular: Positive for leg swelling. Negative for chest pain and palpitations.       Some leg swelling if on his feet a lot or flying---uses support socks Gone in the morning  Gastrointestinal: Negative for nausea, vomiting, abdominal pain, constipation and blood in stool.       No heartburn  Endocrine: Negative for cold intolerance and heat intolerance.  Genitourinary: Negative for urgency, frequency and difficulty urinating.       No sexual problems  Musculoskeletal: Positive for back pain. Negative for joint swelling and arthralgias.       Sees chiropractor monthly to keep back and neck loose  Skin: Negative for rash.       No suspicious lesions  Allergic/Immunologic: Positive for environmental allergies. Negative for immunocompromised state.  Neurological: Negative for dizziness, syncope, weakness, light-headedness, numbness and headaches.  Hematological: Negative for adenopathy. Bruises/bleeds easily.  Psychiatric/Behavioral: Negative for sleep disturbance and dysphoric mood. The patient is not  nervous/anxious.        Sleeps okay with CPAP       Objective:   Physical Exam  Constitutional: He is oriented to person, place, and time. He appears well-developed and well-nourished. No distress.  HENT:  Head: Normocephalic and atraumatic.  Right Ear: External ear normal.  Left Ear: External ear normal.  Mouth/Throat: Oropharynx is clear and moist. No oropharyngeal exudate.  Eyes: Conjunctivae and EOM are normal. Pupils are equal, round, and reactive to light.  Neck: Normal range of motion. Neck supple. No thyromegaly present.  Cardiovascular: Normal rate, regular rhythm, normal heart sounds and intact distal pulses.  Exam  reveals no gallop.   No murmur heard. Pulmonary/Chest: Effort normal and breath sounds normal. No respiratory distress. He has no wheezes. He has no rales.  Abdominal: Soft. There is no tenderness.  Musculoskeletal: He exhibits no edema and no tenderness.  Lymphadenopathy:    He has no cervical adenopathy.  Neurological: He is alert and oriented to person, place, and time.  Skin: No rash noted. No erythema.  Psychiatric: He has a normal mood and affect. His behavior is normal.          Assessment & Plan:

## 2013-03-02 NOTE — Assessment & Plan Note (Signed)
No problems with the med Will continue

## 2013-03-02 NOTE — Assessment & Plan Note (Signed)
BP Readings from Last 3 Encounters:  03/02/13 120/80  08/18/12 130/88  02/16/12 132/80   Good control No changes needed

## 2013-03-04 ENCOUNTER — Encounter: Payer: Self-pay | Admitting: *Deleted

## 2013-09-07 ENCOUNTER — Other Ambulatory Visit: Payer: Self-pay | Admitting: *Deleted

## 2013-09-07 MED ORDER — ATORVASTATIN CALCIUM 40 MG PO TABS: 40.0000 mg | ORAL_TABLET | Freq: Every day | ORAL | Status: DC

## 2013-09-20 ENCOUNTER — Ambulatory Visit (INDEPENDENT_AMBULATORY_CARE_PROVIDER_SITE_OTHER)
Admission: RE | Admit: 2013-09-20 | Discharge: 2013-09-20 | Disposition: A | Discharge status: Home / Self Care | Payer: BC Managed Care – PPO | Source: Ambulatory Visit | Attending: Family Medicine | Admitting: Family Medicine

## 2013-09-20 ENCOUNTER — Encounter: Payer: Self-pay | Admitting: Family Medicine

## 2013-09-20 ENCOUNTER — Ambulatory Visit (INDEPENDENT_AMBULATORY_CARE_PROVIDER_SITE_OTHER): Payer: BC Managed Care – PPO | Admitting: Family Medicine

## 2013-09-20 VITALS — BP 126/72 | HR 80 | Temp 98.1°F | Ht 74.0 in | Wt 253.5 lb

## 2013-09-20 DIAGNOSIS — R0789 Other chest pain: Secondary | ICD-10-CM

## 2013-09-20 DIAGNOSIS — Z23 Encounter for immunization: Secondary | ICD-10-CM

## 2013-09-20 DIAGNOSIS — R079 Chest pain, unspecified: Secondary | ICD-10-CM

## 2013-09-20 NOTE — Progress Notes (Signed)
Subjective:    Patient ID: Russell Garrett, male    DOB: Jun 30, 1951, 62 y.o.   MRN: [id number]  HPI 62 yo smoker with hx of HTN and hyperlipidemia (Dr Karle Starch pt) here with cp Has family hx of heart problems   Nl vitals   Has had pneumonia vaccine 4/14 Needs flu shot   Chest discomfort/pressure for 3 d- worse with deep breath - symptoms are not exertional  On and off - then constant since last night (not severe but annoying)  No uri symptoms No copd  No cough  No recent heavy lifting   No sob  No clamminess/ sweating No nausea  No rad of pain to neck or arm (he does have hx of neck pain however)   Burping a lot   Unsure if he is ready to quit smoking    EKG shows nl rhythm with slt rate variation rate 77 with no acute change  BP Readings from Last 3 Encounters:  09/20/13 126/72  03/02/13 120/80  08/18/12 130/88   Lab Results  Component Value Date   CHOL 150 03/02/2013   HDL 46.70 03/02/2013   LDLCALC 93 03/02/2013   LDLDIRECT 161.3 02/03/2011   TRIG 51.0 03/02/2013   CHOLHDL 3 03/02/2013     Patient Active Problem List   Diagnosis Date Noted  . Chest pain, atypical 09/20/2013  . Routine general medical examination at a health care facility 02/16/2012  . OBSTRUCTIVE SLEEP APNEA 08/01/2008  . HYPERLIPIDEMIA 03/09/2007  . HYPERTENSION 03/09/2007  . DIVERTICULOSIS, COLON 03/09/2007   Past Medical History  Diagnosis Date  . Diverticulitis   . Hyperlipidemia   . Hypertension   . OSA (obstructive sleep apnea)    Past Surgical History  Procedure Laterality Date  . Thoracentesis  08/2006    left pleural effusion   History  Substance Use Topics  . Smoking status: Current Every Day Smoker -- 0.50 packs/day    Types: Cigarettes  . Smokeless tobacco: Never Used  . Alcohol Use: Yes     Comment: occ   Family History  Problem Relation Age of Onset  . Hypertension Mother   . Heart disease Father   . Stroke Father   . Cancer Father   . Diabetes Neg Hx   . Heart  disease Brother     cardioversion for arrhythmia   No Known Allergies Current Outpatient Prescriptions on File Prior to Visit  Medication Sig Dispense Refill  . aspirin 81 MG tablet Take 81 mg by mouth daily.        Marland Kitchen atorvastatin (LIPITOR) 40 MG tablet Take 1 tablet (40 mg total) by mouth daily.  90 tablet  3  . losartan-hydrochlorothiazide (HYZAAR) 50-12.5 MG per tablet Take 1 tablet by mouth daily.  30 tablet  11  . [DISCONTINUED] lisinopril-hydrochlorothiazide (PRINZIDE,ZESTORETIC) 10-12.5 MG per tablet Take 1 tablet by mouth daily.  30 tablet  11   No current facility-administered medications on file prior to visit.    Review of Systems Review of Systems  Constitutional: Negative for fever, appetite change, fatigue and unexpected weight change.  Eyes: Negative for pain and visual disturbance.  Respiratory: Negative for cough and shortness of breath.   Cardiovascular: Negative for pedal edema  or palpitations   neg for PND or orthopnea , neg for sob or exertion  Gastrointestinal: Negative for nausea, diarrhea and constipation.  Genitourinary: Negative for urgency and frequency.  Skin: Negative for pallor or rash   Neurological: Negative for weakness, light-headedness, numbness  and headaches.  Hematological: Negative for adenopathy. Does not bruise/bleed easily.  Psychiatric/Behavioral: Negative for dysphoric mood. The patient is not nervous/anxious.         Objective:   Physical Exam  Constitutional: He appears well-developed and well-nourished. No distress.  HENT:  Head: Normocephalic and atraumatic.  Mouth/Throat: Oropharynx is clear and moist.  Eyes: Conjunctivae and EOM are normal. Pupils are equal, round, and reactive to light. No scleral icterus.  Neck: Normal range of motion. Neck supple. No JVD present. Carotid bruit is not present. No thyromegaly present.  Cardiovascular: Normal rate, regular rhythm, normal heart sounds and intact distal pulses.  Exam reveals no  gallop.   Pulmonary/Chest: Effort normal and breath sounds normal. No respiratory distress. He has no wheezes. He exhibits no tenderness.  Diffusely distant bs   Abdominal: Soft. Bowel sounds are normal. He exhibits no distension, no abdominal bruit and no mass. There is no tenderness.  Musculoskeletal: He exhibits no edema and no tenderness.  No palp cords or swelling  Neg homans sign  Lymphadenopathy:    He has no cervical adenopathy.  Neurological: He is alert. He has normal reflexes. No cranial nerve deficit. He exhibits normal muscle tone. Coordination normal.  Skin: Skin is warm and dry. No rash noted. No erythema. No pallor.  Psychiatric: He has a normal mood and affect.          Assessment & Plan:

## 2013-09-20 NOTE — Patient Instructions (Signed)
If your chest symptoms suddenly worsen or you get short of breath - alert [place] and go to the ER Flu shot today Chest xray now  May consider cardiology visit

## 2013-09-21 ENCOUNTER — Telehealth: Payer: Self-pay | Admitting: Family Medicine

## 2013-09-21 DIAGNOSIS — F17200 Nicotine dependence, unspecified, uncomplicated: Secondary | ICD-10-CM | POA: Insufficient documentation

## 2013-09-21 DIAGNOSIS — R0789 Other chest pain: Secondary | ICD-10-CM

## 2013-09-21 NOTE — Telephone Encounter (Signed)
Message copied by Judy Pimple on Wed Sep 21, 2013  8:01 AM ------      Message from: Shon Millet      Created: Tue Sep 20, 2013  4:44 PM       Pt notified of xray results and agrees with referral to cardiology, I advise pt Marion/Linda will call pt to schedule appt ------

## 2013-09-21 NOTE — Assessment & Plan Note (Signed)
In 62 yo smoker with hx of well controlled HTN and hyperlipidemia, also fam hx of CAD atypical Feels ok today Reassuring EKG and exam  cxr today-hx of pneumonia and scar tissue -no resp or GI symptoms If read as neg-pt is interested in cardiac eval due to his risk factors  Disc imp of smoking cessation - he is not ready to quit at this time  inst if symptoms return/ worse or sob or new symptoms- to alert [place] and go to ER-he voiced understanding Flu vaccine today

## 2013-09-21 NOTE — Telephone Encounter (Signed)
Ref made

## 2013-09-23 ENCOUNTER — Ambulatory Visit: Payer: BC Managed Care – PPO | Admitting: Cardiovascular Disease

## 2013-09-29 ENCOUNTER — Encounter: Payer: Self-pay | Admitting: Cardiovascular Disease

## 2013-09-29 ENCOUNTER — Ambulatory Visit (INDEPENDENT_AMBULATORY_CARE_PROVIDER_SITE_OTHER): Payer: BC Managed Care – PPO | Admitting: Cardiovascular Disease

## 2013-09-29 VITALS — BP 130/84 | HR 60 | Ht 74.0 in | Wt 254.5 lb

## 2013-09-29 DIAGNOSIS — R0789 Other chest pain: Secondary | ICD-10-CM

## 2013-09-29 DIAGNOSIS — E785 Hyperlipidemia, unspecified: Secondary | ICD-10-CM

## 2013-09-29 DIAGNOSIS — F17200 Nicotine dependence, unspecified, uncomplicated: Secondary | ICD-10-CM

## 2013-09-29 DIAGNOSIS — R079 Chest pain, unspecified: Secondary | ICD-10-CM

## 2013-09-29 DIAGNOSIS — I10 Essential (primary) hypertension: Secondary | ICD-10-CM

## 2013-09-29 NOTE — Assessment & Plan Note (Signed)
Blood pressure is well controlled on today's visit. No changes made to the medications. 

## 2013-09-29 NOTE — Patient Instructions (Signed)
You are doing well. No medication changes were made.  Please call gfor any shortness of breath or chest pain We would do a stress test  Please call us if you have new issues that need to be addressed before your next appt.  Follow up in one year

## 2013-09-29 NOTE — Assessment & Plan Note (Signed)
We have encouraged him to continue to work on weaning his cigarettes and smoking cessation. He will continue to work on this and does not want any assistance with chantix.  

## 2013-09-29 NOTE — Assessment & Plan Note (Signed)
Chest pain has resolved. Relatively normal EKG. Suspect symptoms were musculoskeletal given the timing/presentation after lifting chairs and tables 2 days earlier. We did discuss various options including medical management and close monitoring. Also discussed doing a routine treadmill study. He will think about the treadmill and call our office if he would like to have this scheduled. We have suggested he call our office for any further chest pain, shortness of breath.

## 2013-09-29 NOTE — Assessment & Plan Note (Signed)
We have encouraged him to stay on his statin. Cholesterol in a good range at less than 150

## 2013-09-29 NOTE — Progress Notes (Signed)
   Patient ID: Russell Garrett, male    DOB: 12/15/1950, 62 y.o.   MRN: [id number]  HPI Comments: Russell Garrett is a very pleasant 62 year old gentleman with history of smoking one pack per day, hyperlipidemia, hypertension , strong family history of CAD who presents by her for all from Dr. Milinda Antis for left-sided chest pain.  He reports that earlier this week, he was lifting some chairs and tables. This was on Saturday. He feels that he is probably doing too much. 2 days later on Monday, he had tightness in his left chest. It hurt to breathe in, hurt to push on the area. He states that it felt as it had in the past when he had pneumonia in its early stages. Since then it has improved. He saw Dr. Milinda Antis on Tuesday. No further symptoms since then. He strongly believes it was musculoskeletal. He is otherwise active, does not do regular exercise program. He does smoke one pack per day.   EKG shows normal sinus rhythm with rate 60 beats a minute, intraventricular conduction delay, QRS 120 ms   Outpatient Encounter Prescriptions as of 09/29/2013  Medication Sig  . aspirin 81 MG tablet Take 81 mg by mouth daily.    Marland Kitchen atorvastatin (LIPITOR) 40 MG tablet Take 1 tablet (40 mg total) by mouth daily.  Marland Kitchen losartan-hydrochlorothiazide (HYZAAR) 50-12.5 MG per tablet Take 1 tablet by mouth daily.     Review of Systems  Constitutional: Negative.   HENT: Negative.   Eyes: Negative.   Respiratory: Negative.   Cardiovascular: Positive for chest pain.  Gastrointestinal: Negative.   Endocrine: Negative.   Musculoskeletal: Negative.   Skin: Negative.   Allergic/Immunologic: Negative.   Neurological: Negative.   Hematological: Negative.   Psychiatric/Behavioral: Negative.   All other systems reviewed and are negative.    BP 130/84  Pulse 60  Ht 6\' 2"  (1.88 m)  Wt 254 lb 8 oz (115.44 kg)  BMI 32.66 kg/m2  Physical Exam  Nursing note and vitals reviewed. Constitutional: He is oriented to person, place, and time.  He appears well-developed and well-nourished.  HENT:  Head: Normocephalic.  Nose: Nose normal.  Mouth/Throat: Oropharynx is clear and moist.  Eyes: Conjunctivae are normal. Pupils are equal, round, and reactive to light.  Neck: Normal range of motion. Neck supple. No JVD present.  Cardiovascular: Normal rate, regular rhythm, S1 normal, S2 normal, normal heart sounds and intact distal pulses.  Exam reveals no gallop and no friction rub.   No murmur heard. Pulmonary/Chest: Effort normal and breath sounds normal. No respiratory distress. He has no wheezes. He has no rales. He exhibits no tenderness.  Abdominal: Soft. Bowel sounds are normal. He exhibits no distension. There is no tenderness.  Musculoskeletal: Normal range of motion. He exhibits no edema and no tenderness.  Lymphadenopathy:    He has no cervical adenopathy.  Neurological: He is alert and oriented to person, place, and time. Coordination normal.  Skin: Skin is warm and dry. No rash noted. No erythema.  Psychiatric: He has a normal mood and affect. His behavior is normal. Judgment and thought content normal.      Assessment and Plan

## 2014-03-03 ENCOUNTER — Encounter: Payer: Self-pay | Admitting: Internal Medicine

## 2014-03-03 ENCOUNTER — Ambulatory Visit (INDEPENDENT_AMBULATORY_CARE_PROVIDER_SITE_OTHER): Payer: BC Managed Care – PPO | Admitting: Internal Medicine

## 2014-03-03 ENCOUNTER — Telehealth: Payer: Self-pay | Admitting: Internal Medicine

## 2014-03-03 VITALS — BP 110/68 | HR 59 | Temp 97.7°F | Ht 74.0 in | Wt 247.0 lb

## 2014-03-03 DIAGNOSIS — E785 Hyperlipidemia, unspecified: Secondary | ICD-10-CM

## 2014-03-03 DIAGNOSIS — Z0000 Encounter for general adult medical examination without abnormal findings: Secondary | ICD-10-CM

## 2014-03-03 DIAGNOSIS — I10 Essential (primary) hypertension: Secondary | ICD-10-CM

## 2014-03-03 LAB — CBC WITH DIFFERENTIAL/PLATELET
BASOS ABS: 0.1 10*3/uL (ref 0.0–0.1)
Basophils Relative: 0.9 % (ref 0.0–3.0)
Eosinophils Absolute: 0.2 10*3/uL (ref 0.0–0.7)
Eosinophils Relative: 2.5 % (ref 0.0–5.0)
HEMATOCRIT: 44.2 % (ref 39.0–52.0)
Hemoglobin: 14.9 g/dL (ref 13.0–17.0)
LYMPHS ABS: 1.6 10*3/uL (ref 0.7–4.0)
Lymphocytes Relative: 24.4 % (ref 12.0–46.0)
MCHC: 33.7 g/dL (ref 30.0–36.0)
MCV: 90.5 fl (ref 78.0–100.0)
MONO ABS: 0.8 10*3/uL (ref 0.1–1.0)
Monocytes Relative: 11.8 % (ref 3.0–12.0)
Neutro Abs: 4.1 10*3/uL (ref 1.4–7.7)
Neutrophils Relative %: 60.4 % (ref 43.0–77.0)
PLATELETS: 175 10*3/uL (ref 150.0–400.0)
RBC: 4.88 Mil/uL (ref 4.22–5.81)
RDW: 13.6 % (ref 11.5–14.6)
WBC: 6.7 10*3/uL (ref 4.5–10.5)

## 2014-03-03 LAB — LIPID PANEL
CHOLESTEROL: 153 mg/dL (ref 0–200)
HDL: 45.9 mg/dL (ref >39.00)
LDL Cholesterol: 98 mg/dL (ref 0–99)
Total CHOL/HDL Ratio: 3
Triglycerides: 44 mg/dL (ref 0.0–149.0)
VLDL: 8.8 mg/dL (ref 0.0–40.0)

## 2014-03-03 LAB — TSH: TSH: 1.72 u[IU]/mL (ref 0.35–5.50)

## 2014-03-03 LAB — COMPREHENSIVE METABOLIC PANEL
ALK PHOS: 74 U/L (ref 39–117)
ALT: 26 U/L (ref 0–53)
AST: 25 U/L (ref 0–37)
Albumin: 4.4 g/dL (ref 3.5–5.2)
BILIRUBIN TOTAL: 1 mg/dL (ref 0.3–1.2)
BUN: 28 mg/dL — ABNORMAL HIGH (ref 6–23)
CO2: 27 mEq/L (ref 19–32)
Calcium: 9.2 mg/dL (ref 8.4–10.5)
Chloride: 103 mEq/L (ref 96–112)
Creatinine, Ser: 1.1 mg/dL (ref 0.4–1.5)
GFR: 69.02 mL/min (ref >60.00)
Glucose, Bld: 85 mg/dL (ref 70–99)
Potassium: 4.1 mEq/L (ref 3.5–5.1)
SODIUM: 137 meq/L (ref 135–145)
TOTAL PROTEIN: 7.4 g/dL (ref 6.0–8.3)

## 2014-03-03 LAB — T4, FREE: Free T4: 1.03 ng/dL (ref 0.60–1.60)

## 2014-03-03 MED ORDER — LOSARTAN POTASSIUM-HCTZ 50-12.5 MG PO TABS
1.0000 | ORAL_TABLET | Freq: Every day | ORAL | Status: DC
Start: 2014-03-03 — End: 2015-03-07

## 2014-03-03 MED ORDER — ATORVASTATIN CALCIUM 40 MG PO TABS: 40.0000 mg | ORAL_TABLET | Freq: Every day | ORAL | Status: DC

## 2014-03-03 NOTE — Assessment & Plan Note (Signed)
Healthy but needs to work on fitness and stopping smoking Defer PSA

## 2014-03-03 NOTE — Progress Notes (Signed)
Subjective:    Patient ID: Russell Garrett, male    DOB: May 19, 1951, 63 y.o.   MRN: [id number]  HPI Here for physical Doing well Discussed the cigarettes---has cut down by about a half Less than 1PPD Discussed nicotine replacement  Still working hard at home--cutting wood, etc Weight back down to last year's weight Doing better with his low carb diet again  Current Outpatient Prescriptions on File Prior to Visit  Medication Sig Dispense Refill  . aspirin 81 MG tablet Take 81 mg by mouth daily.        . [DISCONTINUED] lisinopril-hydrochlorothiazide (PRINZIDE,ZESTORETIC) 10-12.5 MG per tablet Take 1 tablet by mouth daily.  30 tablet  11   No current facility-administered medications on file prior to visit.    No Known Allergies  Past Medical History  Diagnosis Date  . Diverticulitis   . Hyperlipidemia   . Hypertension   . OSA (obstructive sleep apnea)     Past Surgical History  Procedure Laterality Date  . Thoracentesis  08/2006    left pleural effusion  . Back surgery      Family History  Problem Relation Age of Onset  . Hypertension Mother   . Heart disease Father   . Stroke Father   . Cancer Father   . Diabetes Neg Hx   . Heart disease Brother     cardioversion for arrhythmia    History   Social History  . Marital Status: Married    Spouse Name: N/A    Number of Children: 2  . Years of Education: N/A   Occupational History  . 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 Smoking status: Current Every Day Smoker -- 1.00 packs/day for 35 years    Types: Cigarettes  . Smokeless tobacco: Never Used  . Alcohol Use: Yes     Comment: occ  . Drug Use: No  . Sexual Activity: Not on file   Other Topics Concern  . Not on file   Social History Narrative   Widowed   Remarried June 2014   Review of Systems  Constitutional: Negative for fatigue and unexpected weight change.       Wears seat belt  HENT: Positive for postnasal  drip and rhinorrhea. Negative for dental problem, hearing loss and tinnitus.        Regular with dentist  Eyes: Negative for visual disturbance.       No diplopia or unilateral vision loss  Respiratory: Negative for cough, chest tightness and shortness of breath.   Cardiovascular: Positive for leg swelling. Negative for chest pain and palpitations.       Evening edema---gone in AM. Mostly on left  Gastrointestinal: Negative for nausea, vomiting, abdominal pain, constipation and blood in stool.       No heartburn  Endocrine: Negative for cold intolerance and heat intolerance.  Genitourinary: Negative for urgency, frequency and difficulty urinating.       No sexual problems  Musculoskeletal: Positive for back pain. Negative for arthralgias and joint swelling.       Mild residual back pain---sees chiropractor once a month  Skin: Negative for rash.       No suspicious lesions  Allergic/Immunologic: Positive for environmental allergies. Negative for immunocompromised state.       Mild symptoms---vs a mild cold  Neurological: Negative for dizziness, syncope, weakness, light-headedness, numbness and headaches.  Hematological: Negative for adenopathy. Bruises/bleeds easily.  Psychiatric/Behavioral: Negative for sleep disturbance and  dysphoric mood. The patient is not nervous/anxious.        Sleeps well on CPAP--- ~7cm       Objective:   Physical Exam  Constitutional: He is oriented to person, place, and time. He appears well-developed and well-nourished. No distress.  HENT:  Head: Normocephalic and atraumatic.  Right Ear: External ear normal.  Left Ear: External ear normal.  Mouth/Throat: Oropharynx is clear and moist. No oropharyngeal exudate.  Eyes: Conjunctivae and EOM are normal. Pupils are equal, round, and reactive to light.  Neck: Normal range of motion. Neck supple. No thyromegaly present.  Cardiovascular: Normal rate, regular rhythm, normal heart sounds and intact distal pulses.   Exam reveals no gallop.   No murmur heard. Pulmonary/Chest: Effort normal and breath sounds normal. No respiratory distress. He has no wheezes. He has no rales.  Abdominal: Soft. There is no tenderness.  Musculoskeletal: He exhibits no edema and no tenderness.  Lymphadenopathy:    He has no cervical adenopathy.  Neurological: He is alert and oriented to person, place, and time.  Skin: No rash noted. No erythema.  Psychiatric: He has a normal mood and affect. His behavior is normal.          Assessment & Plan:

## 2014-03-03 NOTE — Assessment & Plan Note (Signed)
Doing fine with the primary prevention

## 2014-03-03 NOTE — Telephone Encounter (Signed)
Relevant patient education assigned to patient using Emmi. ° °

## 2014-03-03 NOTE — Patient Instructions (Addendum)
Please call 1-800 QUIT NOW for help to quit smoking.  DASH Diet The DASH diet stands for "Dietary Approaches to Stop Hypertension." It is a healthy eating plan that has been shown to reduce high blood pressure (hypertension) in as little as 14 days, while also possibly providing other significant health benefits. These other health benefits include reducing the risk of breast cancer after menopause and reducing the risk of type 2 diabetes, heart disease, colon cancer, and stroke. Health benefits also include weight loss and slowing kidney failure in patients with chronic kidney disease.  DIET GUIDELINES  Limit salt (sodium). Your diet should contain less than 1500 mg of sodium daily.  Limit refined or processed carbohydrates. Your diet should include mostly whole grains. Desserts and added sugars should be used sparingly.  Include small amounts of heart-healthy fats. These types of fats include nuts, oils, and tub margarine. Limit saturated and trans fats. These fats have been shown to be harmful in the body. CHOOSING FOODS  The following food groups are based on a 2000 calorie diet. See your Registered Dietitian for individual calorie needs. Grains and Grain Products (6 to 8 servings daily)  Eat More Often: Whole-wheat bread, brown rice, whole-grain or wheat pasta, quinoa, popcorn without added fat or salt (air popped).  Eat Less Often: White bread, white pasta, white rice, cornbread. Vegetables (4 to 5 servings daily)  Eat More Often: Fresh, frozen, and canned vegetables. Vegetables may be raw, steamed, roasted, or grilled with a minimal amount of fat.  Eat Less Often/Avoid: Creamed or fried vegetables. Vegetables in a cheese sauce. Fruit (4 to 5 servings daily)  Eat More Often: All fresh, canned (in natural juice), or frozen fruits. Dried fruits without added sugar. One hundred percent fruit juice ( cup [237 mL] daily).  Eat Less Often: Dried fruits with added sugar. Canned fruit in  light or heavy syrup. [employer], Fish, and Poultry (2 servings or less daily. One serving is 3 to 4 oz [85-114 g]).  Eat More Often: Ninety percent or leaner ground beef, tenderloin, sirloin. Round cuts of beef, chicken breast, [place] breast. All fish. Grill, bake, or broil your meat. Nothing should be fried.  Eat Less Often/Avoid: Fatty cuts of meat, [place], or chicken leg, thigh, or wing. Fried cuts of meat or fish. Dairy (2 to 3 servings)  Eat More Often: Low-fat or fat-free milk, low-fat plain or light yogurt, reduced-fat or part-skim cheese.  Eat Less Often/Avoid: Milk (whole, 2%).Whole milk yogurt. Full-fat cheeses. Nuts, Seeds, and Legumes (4 to 5 servings per week)  Eat More Often: All without added salt.  Eat Less Often/Avoid: Salted nuts and seeds, canned beans with added salt. Fats and Sweets (limited)  Eat More Often: Vegetable oils, tub margarines without trans fats, sugar-free gelatin. Mayonnaise and salad dressings.  Eat Less Often/Avoid: Coconut oils, palm oils, butter, stick margarine, cream, half and half, cookies, candy, pie. FOR MORE INFORMATION The Dash Diet Eating Plan: www.dashdiet.org Document Released: 10/30/2011 Document Revised: 02/02/2012 Document Reviewed: 10/30/2011 [facility] Patient Information 2014 [place], [place].

## 2014-03-03 NOTE — Assessment & Plan Note (Signed)
BP Readings from Last 3 Encounters:  03/03/14 110/68  09/29/13 130/84  09/20/13 126/72   Good control Due for labs

## 2014-03-06 ENCOUNTER — Telehealth: Payer: Self-pay | Admitting: Internal Medicine

## 2014-03-06 NOTE — Telephone Encounter (Signed)
Relevant patient education assigned to patient using Emmi. ° °

## 2014-09-08 ENCOUNTER — Other Ambulatory Visit: Payer: Self-pay

## 2014-11-22 ENCOUNTER — Encounter: Payer: Self-pay | Admitting: Gastroenterology

## 2015-03-07 ENCOUNTER — Ambulatory Visit (INDEPENDENT_AMBULATORY_CARE_PROVIDER_SITE_OTHER): Payer: No Typology Code available for payment source | Admitting: Internal Medicine

## 2015-03-07 ENCOUNTER — Encounter: Payer: Self-pay | Admitting: Internal Medicine

## 2015-03-07 VITALS — BP 130/80 | HR 55 | Temp 97.6°F | Ht 74.0 in | Wt 265.0 lb

## 2015-03-07 DIAGNOSIS — I10 Essential (primary) hypertension: Secondary | ICD-10-CM

## 2015-03-07 DIAGNOSIS — Z125 Encounter for screening for malignant neoplasm of prostate: Secondary | ICD-10-CM | POA: Diagnosis not present

## 2015-03-07 DIAGNOSIS — Z720 Tobacco use: Secondary | ICD-10-CM | POA: Diagnosis not present

## 2015-03-07 DIAGNOSIS — F17200 Nicotine dependence, unspecified, uncomplicated: Secondary | ICD-10-CM

## 2015-03-07 DIAGNOSIS — Z0000 Encounter for general adult medical examination without abnormal findings: Secondary | ICD-10-CM

## 2015-03-07 DIAGNOSIS — E785 Hyperlipidemia, unspecified: Secondary | ICD-10-CM | POA: Diagnosis not present

## 2015-03-07 LAB — CBC WITH DIFFERENTIAL/PLATELET
Basophils Absolute: 0.1 10*3/uL (ref 0.0–0.1)
Basophils Relative: 1.3 % (ref 0.0–3.0)
EOS ABS: 0.3 10*3/uL (ref 0.0–0.7)
Eosinophils Relative: 4.4 % (ref 0.0–5.0)
HEMATOCRIT: 46.9 % (ref 39.0–52.0)
HEMOGLOBIN: 16.2 g/dL (ref 13.0–17.0)
LYMPHS ABS: 1.6 10*3/uL (ref 0.7–4.0)
Lymphocytes Relative: 25.6 % (ref 12.0–46.0)
MCHC: 34.6 g/dL (ref 30.0–36.0)
MCV: 89.4 fl (ref 78.0–100.0)
MONOS PCT: 10.1 % (ref 3.0–12.0)
Monocytes Absolute: 0.6 10*3/uL (ref 0.1–1.0)
NEUTROS ABS: 3.8 10*3/uL (ref 1.4–7.7)
Neutrophils Relative %: 58.6 % (ref 43.0–77.0)
PLATELETS: 167 10*3/uL (ref 150.0–400.0)
RBC: 5.25 Mil/uL (ref 4.22–5.81)
RDW: 13.5 % (ref 11.5–15.5)
WBC: 6.4 10*3/uL (ref 4.0–10.5)

## 2015-03-07 LAB — COMPREHENSIVE METABOLIC PANEL
ALBUMIN: 4.4 g/dL (ref 3.5–5.2)
ALK PHOS: 75 U/L (ref 39–117)
ALT: 24 U/L (ref 0–53)
AST: 21 U/L (ref 0–37)
BILIRUBIN TOTAL: 0.8 mg/dL (ref 0.2–1.2)
BUN: 21 mg/dL (ref 6–23)
CO2: 30 mEq/L (ref 19–32)
Calcium: 9.8 mg/dL (ref 8.4–10.5)
Chloride: 103 mEq/L (ref 96–112)
Creatinine, Ser: 1.06 mg/dL (ref 0.40–1.50)
GFR: 74.83 mL/min (ref >60.00)
GLUCOSE: 94 mg/dL (ref 70–99)
Potassium: 4.6 mEq/L (ref 3.5–5.1)
Sodium: 139 mEq/L (ref 135–145)
TOTAL PROTEIN: 7.4 g/dL (ref 6.0–8.3)

## 2015-03-07 LAB — T4, FREE: Free T4: 0.94 ng/dL (ref 0.60–1.60)

## 2015-03-07 LAB — LIPID PANEL
Cholesterol: 168 mg/dL (ref 0–200)
HDL: 54.7 mg/dL (ref >39.00)
LDL Cholesterol: 100 mg/dL — ABNORMAL HIGH (ref 0–99)
NONHDL: 113.3
Total CHOL/HDL Ratio: 3
Triglycerides: 67 mg/dL (ref 0.0–149.0)
VLDL: 13.4 mg/dL (ref 0.0–40.0)

## 2015-03-07 LAB — PSA: PSA: 0.97 ng/mL (ref 0.10–4.00)

## 2015-03-07 MED ORDER — ATORVASTATIN CALCIUM 40 MG PO TABS: 40.0000 mg | ORAL_TABLET | Freq: Every day | ORAL | Status: DC

## 2015-03-07 MED ORDER — LOSARTAN POTASSIUM-HCTZ 50-12.5 MG PO TABS: 1.0000 | ORAL_TABLET | Freq: Every day | ORAL | Status: DC

## 2015-03-07 NOTE — Progress Notes (Signed)
Pre visit review using our clinic review tool, if applicable. No additional management support is needed unless otherwise documented below in the visit note. 

## 2015-03-07 NOTE — Progress Notes (Signed)
Subjective:    Patient ID: Russell Garrett, male    DOB: 1950/12/14, 64 y.o.   MRN: [id number]  HPI Here for physical Still enjoys retirement and stays busy No new concerns---just some aches and pains Not as active this winter--gained 18# since last year  No problems with his meds No change in [place]  Current Outpatient Prescriptions on File Prior to Visit  Medication Sig Dispense Refill  . aspirin 81 MG tablet Take 81 mg by mouth daily.      . [DISCONTINUED] lisinopril-hydrochlorothiazide (PRINZIDE,ZESTORETIC) 10-12.5 MG per tablet Take 1 tablet by mouth daily. 30 tablet 11   No current facility-administered medications on file prior to visit.    No Known Allergies  Past Medical History  Diagnosis Date  . Diverticulitis   . Hyperlipidemia   . Hypertension   . OSA (obstructive sleep apnea)     Past Surgical History  Procedure Laterality Date  . Thoracentesis  08/2006    left pleural effusion  . Back surgery      Family History  Problem Relation Age of Onset  . Hypertension Mother   . Heart disease Father   . Stroke Father   . Cancer Father   . Diabetes Neg Hx   . Heart disease Brother     cardioversion for arrhythmia    History   Social History  . Marital Status: Married    Spouse Name: N/A  . Number of Children: 2  . Years of Education: N/A   Occupational History  . 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 Smoking status: Current Every Day Smoker -- 1.00 packs/day for 35 years    Types: Cigarettes  . Smokeless tobacco: Never Used  . Alcohol Use: Yes     Comment: occ  . Drug Use: No  . Sexual Activity: Not on file   Other Topics Concern  . Not on file   Social History Narrative   Widowed   Remarried June 2014   Review of Systems  Constitutional: Positive for unexpected weight change. Negative for fatigue.       Wears seat belt  HENT: Negative for dental problem, hearing loss and tinnitus.        Keeps up with  dentist--recent crown  Eyes: Negative for visual disturbance.       No diplopia or unilateral vision loss  Respiratory: Negative for cough, chest tightness and shortness of breath.   Cardiovascular: Positive for leg swelling. Negative for chest pain and palpitations.  Gastrointestinal: Negative for nausea, vomiting, abdominal pain, constipation and blood in stool.       Rare heartburn  Endocrine: Negative for polydipsia and polyuria.  Genitourinary: Negative for urgency, frequency and difficulty urinating.       No sexual problems  Musculoskeletal: Positive for neck pain. Negative for back pain and arthralgias.       Sees Dr Gerome Sam monthly and that helps his neck  Skin: Negative for rash.       No suspicious lesions  Allergic/Immunologic: Negative for environmental allergies and immunocompromised state.  Neurological: Positive for numbness. Negative for dizziness, syncope, weakness and headaches.       Will get some numbness in hands--just goes away when he moves them  Hematological: Negative for adenopathy. Does not bruise/bleed easily.  Psychiatric/Behavioral: Negative for sleep disturbance and dysphoric mood. The patient is not nervous/anxious.        Sleeps well with CPAP  Objective:   Physical Exam  Constitutional: He is oriented to person, place, and time. He appears well-developed and well-nourished. No distress.  HENT:  Head: Normocephalic and atraumatic.  Right Ear: External ear normal.  Left Ear: External ear normal.  Mouth/Throat: Oropharynx is clear and moist. No oropharyngeal exudate.  Eyes: Conjunctivae and EOM are normal. Pupils are equal, round, and reactive to light.  Neck: Normal range of motion. Neck supple. No thyromegaly present.  Cardiovascular: Normal rate, regular rhythm, normal heart sounds and intact distal pulses.  Exam reveals no gallop.   No murmur heard. Pulmonary/Chest: Effort normal and breath sounds normal. No respiratory distress. He has no  wheezes. He has no rales.  Abdominal: Soft. There is no tenderness.  Musculoskeletal: He exhibits no tenderness.  1+ ankle edema  Lymphadenopathy:    He has no cervical adenopathy.  Neurological: He is alert and oriented to person, place, and time.  Skin: No rash noted. No erythema.  Psychiatric: He has a normal mood and affect. His behavior is normal.          Assessment & Plan:

## 2015-03-07 NOTE — Patient Instructions (Signed)
Please call 1-800 QUIT NOW for advice on stopping smoking  DASH Eating Plan DASH stands for "Dietary Approaches to Stop Hypertension." The DASH eating plan is a healthy eating plan that has been shown to reduce high blood pressure (hypertension). Additional health benefits may include reducing the risk of type 2 diabetes mellitus, heart disease, and stroke. The DASH eating plan may also help with weight loss. WHAT DO I NEED TO KNOW ABOUT THE DASH EATING PLAN? For the DASH eating plan, you will follow these general guidelines:  Choose foods with a percent daily value for sodium of less than 5% (as listed on the food label).  Use salt-free seasonings or herbs instead of table salt or sea salt.  Check with your health care provider or pharmacist before using salt substitutes.  Eat lower-sodium products, often labeled as "lower sodium" or "no salt added."  Eat fresh foods.  Eat more vegetables, fruits, and low-fat dairy products.  Choose whole grains. Look for the word "whole" as the first word in the ingredient list.  Choose fish and skinless chicken or [place] more often than red meat. Limit fish, poultry, and meat to 6 oz (170 g) each day.  Limit sweets, desserts, sugars, and sugary drinks.  Choose heart-healthy fats.  Limit cheese to 1 oz (28 g) per day.  Eat more home-cooked food and less restaurant, buffet, and fast food.  Limit fried foods.  Cook foods using methods other than frying.  Limit canned vegetables. If you do use them, rinse them well to decrease the sodium.  When eating at a restaurant, ask that your food be prepared with less salt, or no salt if possible. WHAT FOODS CAN I EAT? Seek help from a dietitian for individual calorie needs. Grains Whole grain or whole wheat bread. Brown rice. Whole grain or whole wheat pasta. Quinoa, bulgur, and whole grain cereals. Low-sodium cereals. Corn or whole wheat flour tortillas. Whole grain cornbread. Whole grain crackers.  Low-sodium crackers. Vegetables Fresh or frozen vegetables (raw, steamed, roasted, or grilled). Low-sodium or reduced-sodium tomato and vegetable juices. Low-sodium or reduced-sodium tomato sauce and paste. Low-sodium or reduced-sodium canned vegetables.  Fruits All fresh, canned (in natural juice), or frozen fruits. Meat and Other Protein Products Ground beef (85% or leaner), grass-fed beef, or beef trimmed of fat. Skinless chicken or [place]. Ground chicken or [place]. Pork trimmed of fat. All fish and seafood. Eggs. Dried beans, peas, or lentils. Unsalted nuts and seeds. Unsalted canned beans. Dairy Low-fat dairy products, such as skim or 1% milk, 2% or reduced-fat cheeses, low-fat ricotta or cottage cheese, or plain low-fat yogurt. Low-sodium or reduced-sodium cheeses. Fats and Oils Tub margarines without trans fats. Light or reduced-fat mayonnaise and salad dressings (reduced sodium). Avocado. Safflower, olive, or canola oils. Natural peanut or almond butter. Other Unsalted popcorn and pretzels. The items listed above may not be a complete list of recommended foods or beverages. Contact your dietitian for more options. WHAT FOODS ARE NOT RECOMMENDED? Grains White bread. White pasta. White rice. Refined cornbread. Bagels and croissants. Crackers that contain trans fat. Vegetables Creamed or fried vegetables. Vegetables in a cheese sauce. Regular canned vegetables. Regular canned tomato sauce and paste. Regular tomato and vegetable juices. Fruits Dried fruits. Canned fruit in light or heavy syrup. Fruit juice. Meat and Other Protein Products Fatty cuts of meat. Ribs, chicken wings, bacon, sausage, bologna, salami, chitterlings, fatback, hot dogs, bratwurst, and packaged luncheon meats. Salted nuts and seeds. Canned beans with salt. Dairy Whole or 2%  milk, cream, half-and-half, and cream cheese. Whole-fat or sweetened yogurt. Full-fat cheeses or blue cheese. Nondairy creamers and whipped  toppings. Processed cheese, cheese spreads, or cheese curds. Condiments Onion and garlic salt, seasoned salt, table salt, and sea salt. Canned and packaged gravies. Worcestershire sauce. Tartar sauce. Barbecue sauce. Teriyaki sauce. Soy sauce, including reduced sodium. Steak sauce. Fish sauce. Oyster sauce. Cocktail sauce. Horseradish. Ketchup and mustard. Meat flavorings and tenderizers. Bouillon cubes. Hot sauce. Tabasco sauce. Marinades. Taco seasonings. Relishes. Fats and Oils Butter, stick margarine, lard, shortening, ghee, and bacon fat. Coconut, palm kernel, or palm oils. Regular salad dressings. Other Pickles and olives. Salted popcorn and pretzels. The items listed above may not be a complete list of foods and beverages to avoid. Contact your dietitian for more information. WHERE CAN I FIND MORE INFORMATION? National Heart, Lung, and Blood Institute: travelstabloid.com Document Released: 10/30/2011 Document Revised: 03/27/2014 Document Reviewed: 09/14/2013 [facility] Patient Information 2015 [place], [place]. This information is not intended to replace advice given to you by your health care provider. Make sure you discuss any questions you have with your health care provider. Exercise to Lose Weight Exercise and a healthy diet may help you lose weight. Your doctor may suggest specific exercises. EXERCISE IDEAS AND TIPS  Choose low-cost things you enjoy doing, such as walking, bicycling, or exercising to workout videos.  Take stairs instead of the elevator.  Walk during your lunch break.  Park your car further away from work or school.  Go to a gym or an exercise class.  Start with 5 to 10 minutes of exercise each day. Build up to 30 minutes of exercise 4 to 6 days a week.  Wear shoes with good support and comfortable clothes.  Stretch before and after working out.  Work out until you breathe harder and your heart beats faster.  Drink extra  water when you exercise.  Do not do so much that you hurt yourself, feel dizzy, or get very short of breath. Exercises that burn about 150 calories:  Running 1  miles in 15 minutes.  Playing volleyball for 45 to 60 minutes.  Washing and waxing a car for 45 to 60 minutes.  Playing touch football for 45 minutes.  Walking 1  miles in 35 minutes.  Pushing a stroller 1  miles in 30 minutes.  Playing basketball for 30 minutes.  Raking leaves for 30 minutes.  Bicycling 5 miles in 30 minutes.  Walking 2 miles in 30 minutes.  Dancing for 30 minutes.  Shoveling snow for 15 minutes.  Swimming laps for 20 minutes.  Walking up stairs for 15 minutes.  Bicycling 4 miles in 15 minutes.  Gardening for 30 to 45 minutes.  Jumping rope for 15 minutes.  Washing windows or floors for 45 to 60 minutes. Document Released: 12/13/2010 Document Revised: 02/02/2012 Document Reviewed: 12/13/2010 [facility] Patient Information 2015 [place], [place]. This information is not intended to replace advice given to you by your health care provider. Make sure you discuss any questions you have with your health care provider.

## 2015-03-07 NOTE — Assessment & Plan Note (Signed)
Healthy but out of shape Colonoscopy due 2018 Will check PSA after discussion

## 2015-03-07 NOTE — Assessment & Plan Note (Signed)
Still doing fine with primary prevention with statin

## 2015-03-07 NOTE — Assessment & Plan Note (Signed)
BP Readings from Last 3 Encounters:  03/07/15 130/80  03/03/14 110/68  09/29/13 130/84   Good control

## 2015-03-07 NOTE — Assessment & Plan Note (Signed)
Counseled 3-4 minutes Really enjoys smoking but considering quitting Discussed nicotine replacement and recommended quit line

## 2015-07-19 ENCOUNTER — Emergency Department (HOSPITAL_COMMUNITY)
Admission: EM | Admit: 2015-07-19 | Discharge: 2015-07-19 | Disposition: A | Discharge status: Home / Self Care | Payer: No Typology Code available for payment source | Attending: Emergency Medicine | Admitting: Emergency Medicine

## 2015-07-19 ENCOUNTER — Encounter (HOSPITAL_COMMUNITY): Payer: Self-pay | Admitting: Emergency Medicine

## 2015-07-19 ENCOUNTER — Telehealth: Payer: Self-pay | Admitting: Internal Medicine

## 2015-07-19 ENCOUNTER — Emergency Department (HOSPITAL_COMMUNITY): Payer: No Typology Code available for payment source

## 2015-07-19 DIAGNOSIS — R0609 Other forms of dyspnea: Secondary | ICD-10-CM | POA: Diagnosis not present

## 2015-07-19 DIAGNOSIS — Z8669 Personal history of other diseases of the nervous system and sense organs: Secondary | ICD-10-CM | POA: Diagnosis not present

## 2015-07-19 DIAGNOSIS — N179 Acute kidney failure, unspecified: Secondary | ICD-10-CM | POA: Diagnosis not present

## 2015-07-19 DIAGNOSIS — I10 Essential (primary) hypertension: Secondary | ICD-10-CM | POA: Diagnosis not present

## 2015-07-19 DIAGNOSIS — Z8639 Personal history of other endocrine, nutritional and metabolic disease: Secondary | ICD-10-CM | POA: Diagnosis not present

## 2015-07-19 DIAGNOSIS — Z720 Tobacco use: Secondary | ICD-10-CM | POA: Insufficient documentation

## 2015-07-19 DIAGNOSIS — R0602 Shortness of breath: Secondary | ICD-10-CM | POA: Diagnosis present

## 2015-07-19 LAB — COMPREHENSIVE METABOLIC PANEL
ALK PHOS: 74 U/L (ref 38–126)
ALT: 33 U/L (ref 17–63)
AST: 33 U/L (ref 15–41)
Albumin: 4.8 g/dL (ref 3.5–5.0)
Anion gap: 12 (ref 5–15)
BUN: 31 mg/dL — AB (ref 6–20)
CALCIUM: 9.9 mg/dL (ref 8.9–10.3)
CHLORIDE: 106 mmol/L (ref 101–111)
CO2: 23 mmol/L (ref 22–32)
CREATININE: 1.89 mg/dL — AB (ref 0.61–1.24)
GFR calc Af Amer: 42 mL/min — ABNORMAL LOW (ref >60)
GFR, EST NON AFRICAN AMERICAN: 36 mL/min — AB (ref >60)
Glucose, Bld: 80 mg/dL (ref 65–99)
Potassium: 4.5 mmol/L (ref 3.5–5.1)
Sodium: 141 mmol/L (ref 135–145)
Total Bilirubin: 1.2 mg/dL (ref 0.3–1.2)
Total Protein: 8 g/dL (ref 6.5–8.1)

## 2015-07-19 LAB — CBC WITH DIFFERENTIAL/PLATELET
BASOS PCT: 1 % (ref 0–1)
Basophils Absolute: 0 10*3/uL (ref 0.0–0.1)
EOS ABS: 0.1 10*3/uL (ref 0.0–0.7)
EOS PCT: 1 % (ref 0–5)
HCT: 46 % (ref 39.0–52.0)
Hemoglobin: 15.7 g/dL (ref 13.0–17.0)
LYMPHS ABS: 1.3 10*3/uL (ref 0.7–4.0)
Lymphocytes Relative: 15 % (ref 12–46)
MCH: 31.5 pg (ref 26.0–34.0)
MCHC: 34.1 g/dL (ref 30.0–36.0)
MCV: 92.2 fL (ref 78.0–100.0)
MONOS PCT: 7 % (ref 3–12)
Monocytes Absolute: 0.6 10*3/uL (ref 0.1–1.0)
Neutro Abs: 6.5 10*3/uL (ref 1.7–7.7)
Neutrophils Relative %: 76 % (ref 43–77)
PLATELETS: 174 10*3/uL (ref 150–400)
RBC: 4.99 MIL/uL (ref 4.22–5.81)
RDW: 13.3 % (ref 11.5–15.5)
WBC: 8.6 10*3/uL (ref 4.0–10.5)

## 2015-07-19 LAB — I-STAT TROPONIN, ED: Troponin i, poc: 0 ng/mL (ref 0.00–0.08)

## 2015-07-19 LAB — BRAIN NATRIURETIC PEPTIDE: B NATRIURETIC PEPTIDE 5: 13.3 pg/mL (ref 0.0–100.0)

## 2015-07-19 NOTE — Telephone Encounter (Signed)
[facility] Patient Name: Russell Garrett DOB: 1950-12-15 Initial Comment Caller states her husband is having shortness off breathe. Has had a cough for months. Nurse Assessment Nurse: Mechele Dawley, RN, Amy Date/Time Eilene Ghazi Time): 07/19/2015 1:08:30 PM Confirm and document reason for call. If symptomatic, describe symptoms. ---SOB AND COUGH FOR MONTHS. SOB STARTED TODAY. HE IS OUT OF BREATH. HE HAS BEEN WORKING IN THE YARD TODAY. HE HAS TOLD HIS WIFE ABOUT IT. NO HISTORY OF LUNG ISSUES. HE IS ABLE TO SPEAK IN SENTENCES. NO CHEST PAIN. BREATHING RAPIDLY - WIFE STATES THAT HE IS BREATHING SHALLOW. COUGH HAS BEEN BACK AND FORTH FROM CONGESTION TO DRY. Has the patient traveled out of the country within the last 30 days? ---Not Applicable Does the patient require triage? ---Yes Related visit to physician within the last 2 weeks? ---No Does the PT have any chronic conditions? (i.e. diabetes, asthma, etc.) ---Yes List chronic conditions. ---SLEEP APNEA, SMOKER Guidelines Guideline Title Affirmed Question Affirmed Notes Breathing Difficulty [1] MODERATE difficulty breathing (e.g., speaks in phrases, SOB even at rest, pulse 100-120) AND [2] NEW-onset or WORSE than normal Final Disposition User Go to ED Now [place], Therapist, sports, [facility] - ED Disagree/Comply: Comply

## 2015-07-19 NOTE — ED Notes (Addendum)
Pt report SOB for several weeks and when he went up a step ladder he was out of breath. States usually active and walks golf courses with no issues. States he usually goes on elliptical with no problems. States he has no tolerance for activity these past few weeks and has become diaphoretic. HTN but no diabetes, no cardiac issues that he knows over. Never had stress test. Edema bilaterally in legs worsening. Denies any chest pain. Smoker 1 pack/day.

## 2015-07-19 NOTE — ED Provider Notes (Signed)
History   Chief Complaint  Patient presents with  . Shortness of Breath    HPI 64 year old male past mental history as below who presents ED for evaluation of shortness of breath which is been ongoing for the past week and half. Patient reports over the last several months he has had progressive fatigue. He says he has typically been relatively active and over the last several months has been having more more fatigue and now short of breath. Patient also notes having some mild swelling in his legs during this time. Denies any leg pain however. No recent long trips. No history of DVT/PE, CHF, kidney failure. Patient states he is otherwise in his usual state of health and denies any recent illness, fevers, chills, cough, chest pain, abdominal pain, nausea, vomiting, diarrhea, diaphoresis. He states he saw his doctor back in April and had a clean bill of health. He also notes starting a new diet a few weeks ago and notes losing about 10 pounds during this time. He states he's doing a low-carb diet. No other complaints at this time.  Onset of symptoms: Gradual.  Past medical/surgical history, social history, medications, allergies and FH have been reviewed with patient and/or in documentation. Furthermore, if pt family or friend(s) present, additional historical information was obtained from them.  Past Medical History  Diagnosis Date  . Diverticulitis   . Hyperlipidemia   . Hypertension   . OSA (obstructive sleep apnea)    Past Surgical History  Procedure Laterality Date  . Thoracentesis  08/2006    left pleural effusion  . Back surgery     Family History  Problem Relation Age of Onset  . Hypertension Mother   . Heart disease Father   . Stroke Father   . Cancer Father   . Diabetes Neg Hx   . Heart disease Brother     cardioversion for arrhythmia   Social History  Substance Use Topics  . Smoking status: Current Every Day Smoker -- 1.00 packs/day for 35 years    Types: Cigarettes   . Smokeless tobacco: Never Used  . Alcohol Use: Yes     Comment: occ     Review of Systems Constitutional: - F/C, -fatigue.  HENT: - congestion, -rhinorrhea, -sore throat.   Eyes: - eye pain, -visual disturbance.  Respiratory: - cough, +SOB, -hemoptysis.   Cardiovascular: - CP, -palps.  Gastrointestinal: - N/V/D, -abd pain  Genitourinary: - flank pain, -dysuria, -frequency.  Musculoskeletal: - myalgia/arthritis, -joint swelling, -gait abnormality, -back pain, -neck pain/stiffness, +leg swelling.  Skin: - rash/lesion.  Neurological: - focal weakness, -lightheadedness, -dizziness, -numbness, -HA.  All other systems reviewed and are negative.   Physical Exam  Physical Exam  ED Triage Vitals  Enc Vitals Group     BP 07/19/15 1510 121/66 mmHg     Pulse Rate 07/19/15 1510 68     Resp 07/19/15 1510 18     Temp 07/19/15 1510 97.7 F (36.5 C)     Temp Source 07/19/15 1510 Oral     SpO2 07/19/15 1510 96 %     Weight --      Height --      Head Cir --      Peak Flow --      Pain Score --      Pain Loc --      Pain Edu? --      Excl. in [place]? --    Constitutional: Patient is well appearing, well hydrated and in no  acute distress Head: Normocephalic and atraumatic.  Eyes: Extraocular motion intact, no scleral icterus Mouth: MMM, OP clear Neck: Supple without meningismus, mass, or overt JVD Respiratory: No respiratory distress. Normal WOB. No w/r/g. CV: RRR, no obvious murmurs.  Pulses +2 and symmetric. Euvolemic Abdomen: Soft, NT, ND, no r/g. No mass.  MSK: Extremities are atraumatic without deformity, ROM intact. Minimal BLE edema without TTP. Skin: Warm, dry, intact without rash Neuro: AAOx4, MAE 5/5 sym, no focal deficit noted   ED Course  Procedures   Labs Reviewed  COMPREHENSIVE METABOLIC PANEL - Abnormal; Notable for the following:    BUN 31 (*)    Creatinine, Ser 1.89 (*)    GFR calc non Af Amer 36 (*)    GFR calc Af Amer 42 (*)    All other components within  normal limits  CBC WITH DIFFERENTIAL/PLATELET  BRAIN NATRIURETIC PEPTIDE  I-STAT TROPOININ, ED   I personally reviewed and interpreted all labs.  Dg Chest 2 View  07/19/2015   CLINICAL DATA:  Shortness of breath, productive cough.  EXAM: CHEST  2 VIEW  COMPARISON:  September 20, 2013.  FINDINGS: The heart size and mediastinal contours are within normal limits. Both lungs are clear. No pneumothorax or pleural effusion is noted. Stable elevation of right hemidiaphragm is noted. The visualized skeletal structures are unremarkable.  IMPRESSION: No active cardiopulmonary disease.   Electronically Signed   By: Marijo Conception, M.D.   On: 07/19/2015 14:40   I personally viewed above image(s) which were used in my medical decision making. Formal interpretations by Radiology.   EKG Interpretation  Date/Time:  Thursday July 19 2015 14:03:00 EDT Ventricular Rate:  74 PR Interval:  150 QRS Duration: 120 QT Interval:  408 QTC Calculation: 452 R Axis:   6 Text Interpretation:  Normal sinus rhythm Non-specific intra-ventricular conduction delay No significant change since last tracing Confirmed by Ashok Cordia  MD, Lennette Bihari ([id number]) on 07/19/2015 3:11:16 PM       MDM: Russell Garrett is a 64 y.o. male with H&P as above who p/w CC: Shortness of breath  Patient is hemodynamically stable and in no apparent distress on arrival with benign exam as above. Clinical picture does not suggest ACS at this time an EKG shows normal sinus rhythm without signs of acute ischemia. At this time patient has undifferentiated shortness of breath and no findings to raise concern for PE. Patient has [place] 0. Patient will get screening labs, chest x-ray for further evaluation.  Chest x-ray is unremarkable. Labs are notable for creatinine of 1.8 and GFR of 34. This is new for the patient. Patient is without urinary symptoms today. He states he is tolerating by mouth very well. No indication for IV fluids at this time. Patient's clinical  picture is not consistent with infectious versus other acute metabolic versus ACS/other cardiopulmonary etiology. Lengthy discussion held with patient and his wife regarding discharge planning. I feel patient is stable for outpatient follow-up with his PCP for recheck of electrolytes in the next few days. I advise he drank at least a glass of water a day. I reviewed strict return precautions with patient and his wife at length and they voice understanding of this. Her specifically told to return to ED for any worsening of shortness of breath, chest pain, fevers, urinary symptoms. They agreed to do so.  Stable for discharge.  Old records reviewed (if available). Labs and imaging reviewed personally by myself and considered in medical decision making if ordered.  Clinical Impression: 1. DOE (dyspnea on exertion)   2. AKI (acute kidney injury)     Disposition: Discharge  Condition: Good  I have discussed the results, Dx and Tx plan with the pt(& family if present). He/she/they expressed understanding and agree(s) with the plan. Discharge instructions discussed at great length. Strict return precautions discussed and pt &/or family have verbalized understanding of the instructions. No further questions at time of discharge.    New Prescriptions   No medications on file    Follow Up: Venia Carbon, MD [place] [id number] [phone]  On 07/23/2015 no later than date above for recheck of elecrolytes and further evaluation of ongoing shortness of breath  [place] [address] [id number] [place] [phone]  If symptoms worsen   Pt seen in conjunction with Dr. Lajean Saver, MD  Kirstie Peri, [facility] Resident - PGY-3      Kirstie Peri, MD 07/19/15 1714  Lajean Saver, MD 07/22/15 [phone]

## 2015-07-19 NOTE — Telephone Encounter (Signed)
Please check on him tomorrow 

## 2015-07-19 NOTE — Telephone Encounter (Signed)
Pt at [facility] ED now.

## 2015-07-19 NOTE — Discharge Instructions (Signed)
Acute Kidney Injury Acute kidney injury is a disease in which there is sudden (acute) damage to the kidneys. The kidneys are 2 organs that lie on either side of the spine between the middle of the back and the front of the abdomen. The kidneys:  Remove wastes and extra water from the blood.   Produce important hormones. These help keep bones strong, regulate blood pressure, and help create red blood cells.   Balance the fluids and chemicals in the blood and tissues. A small amount of kidney damage may not cause problems, but a large amount of damage may make it difficult or impossible for the kidneys to work the way they should. Acute kidney injury may develop into long-lasting (chronic) kidney disease. It may also develop into a life-threatening disease called end-stage kidney disease. Acute kidney injury can get worse very quickly, so it should be treated right away. Early treatment may prevent other kidney diseases from developing.  CAUSES   A problem with blood flow to the kidneys. This may be caused by:   Blood loss.   Heart disease.   Severe burns.   Liver disease.  Direct damage to the kidneys. This may be caused by:  Some medicines.   A kidney infection.   Poisoning or consuming toxic substances.   A surgical wound.   A blow to the kidney area.   A problem with urine flow. This may be caused by:   Cancer.   Kidney stones.   An enlarged prostate. SYMPTOMS   Swelling (edema) of the legs, ankles, or feet.   Tiredness (lethargy).   Nausea or vomiting.   Confusion.   Problems with urination, such as:   Painful or burning feeling during urination.   Decreased urine production.   Frequent accidents in children who are potty trained.   Bloody urine.   Muscle twitches and cramps.   Shortness of breath.   Seizures.   Chest pain or pressure. Sometimes, no symptoms are present. DIAGNOSIS Acute kidney injury may be detected  and diagnosed by tests, including blood, urine, imaging, or kidney biopsy tests.  TREATMENT Treatment of acute kidney injury varies depending on the cause and severity of the kidney damage. In mild cases, no treatment may be needed. The kidneys may heal on their own. If acute kidney injury is more severe, your caregiver will treat the cause of the kidney damage, help the kidneys heal, and prevent complications from occurring. Severe cases may require a procedure to remove toxic wastes from the body (dialysis) or surgery to repair kidney damage. Surgery may involve:   Repair of a torn kidney.   Removal of an obstruction. Most of the time, you will need to stay overnight at the hospital.  HOME CARE INSTRUCTIONS:  Follow your prescribed diet.  Only take over-the-counter or prescription medicines as directed by your caregiver.  Do not take any new medicines (prescription, over-the-counter, or nutritional supplements) unless approved by your caregiver. Many medicines can worsen your kidney damage or need to have the dose adjusted.   Keep all follow-up appointments as directed by your caregiver.  Observe your condition to make sure you are healing as expected. SEEK IMMEDIATE MEDICAL CARE IF:  You are feeling ill or have severe pain in the back or side.   Your symptoms return or you have new symptoms.  You have any symptoms of end-stage kidney disease. These include:   Persistent itchiness.   Loss of appetite.   Headaches.   Abnormally dark  or light skin.  Numbness in the hands or feet.   Easy bruising.   Frequent hiccups.   Menstruation stops.   You have a fever.  You have increased urine production.  You have pain or bleeding when urinating. MAKE SURE YOU:   Understand these instructions.  Will watch your condition.  Will get help right away if you are not doing well or get worse Document Released: 05/26/2011 Document Revised: 03/07/2013 Document  Reviewed: 07/09/2012 [facility] Patient Information 2015 [place], [place]. This information is not intended to replace advice given to you by your health care provider. Make sure you discuss any questions you have with your health care provider.  Shortness of Breath Shortness of breath means you have trouble breathing. It could also mean that you have a medical problem. You should get immediate medical care for shortness of breath. CAUSES   Not enough oxygen in the air such as with high altitudes or a smoke-filled room.  Certain lung diseases, infections, or problems.  Heart disease or conditions, such as angina or heart failure.  Low red blood cells (anemia).  Poor physical fitness, which can cause shortness of breath when you exercise.  Chest or back injuries or stiffness.  Being overweight.  Smoking.  Anxiety, which can make you feel like you are not getting enough air. DIAGNOSIS  Serious medical problems can often be found during your physical exam. Tests may also be done to determine why you are having shortness of breath. Tests may include:  Chest X-rays.  Lung function tests.  Blood tests.  An electrocardiogram (ECG).  An ambulatory electrocardiogram. An ambulatory ECG records your heartbeat patterns over a 24-hour period.  Exercise testing.  A transthoracic echocardiogram (TTE). During echocardiography, sound waves are used to evaluate how blood flows through your heart.  A transesophageal echocardiogram (TEE).  Imaging scans. Your health care provider may not be able to find a cause for your shortness of breath after your exam. In this case, it is important to have a follow-up exam with your health care provider as directed.  TREATMENT  Treatment for shortness of breath depends on the cause of your symptoms and can vary greatly. HOME CARE INSTRUCTIONS   Do not smoke. Smoking is a common cause of shortness of breath. If you smoke, ask for help to quit.  Avoid  being around chemicals or things that may bother your breathing, such as paint fumes and dust.  Rest as needed. Slowly resume your usual activities.  If medicines were prescribed, take them as directed for the full length of time directed. This includes oxygen and any inhaled medicines.  Keep all follow-up appointments as directed by your health care provider. SEEK MEDICAL CARE IF:   Your condition does not improve in the time expected.  You have a hard time doing your normal activities even with rest.  You have any new symptoms. SEEK IMMEDIATE MEDICAL CARE IF:   Your shortness of breath gets worse.  You feel light-headed, faint, or develop a cough not controlled with medicines.  You start coughing up blood.  You have pain with breathing.  You have chest pain or pain in your arms, shoulders, or abdomen.  You have a fever.  You are unable to walk up stairs or exercise the way you normally do. MAKE SURE YOU:  Understand these instructions.  Will watch your condition.  Will get help right away if you are not doing well or get worse. Document Released: 08/05/2001 Document Revised: 11/15/2013  Document Reviewed: 01/26/2012 [facility] Patient Information 2015 [place], [place]. This information is not intended to replace advice given to you by your health care provider. Make sure you discuss any questions you have with your health care provider.

## 2015-07-20 NOTE — Telephone Encounter (Signed)
Spoke with wife, pt is doing fine and out playing golf this morning, per wife ED told him to follow-up , pt has appt 07/24/2015

## 2015-07-24 ENCOUNTER — Encounter: Payer: Self-pay | Admitting: Internal Medicine

## 2015-07-24 ENCOUNTER — Ambulatory Visit (INDEPENDENT_AMBULATORY_CARE_PROVIDER_SITE_OTHER): Payer: No Typology Code available for payment source | Admitting: Internal Medicine

## 2015-07-24 VITALS — BP 112/70 | HR 73 | Temp 98.4°F | Wt 266.0 lb

## 2015-07-24 DIAGNOSIS — N179 Acute kidney failure, unspecified: Secondary | ICD-10-CM | POA: Diagnosis not present

## 2015-07-24 DIAGNOSIS — R0609 Other forms of dyspnea: Secondary | ICD-10-CM | POA: Diagnosis not present

## 2015-07-24 LAB — RENAL FUNCTION PANEL
ALBUMIN: 4.3 g/dL (ref 3.5–5.2)
BUN: 25 mg/dL — AB (ref 6–23)
CALCIUM: 9.5 mg/dL (ref 8.4–10.5)
CO2: 30 mEq/L (ref 19–32)
CREATININE: 1.12 mg/dL (ref 0.40–1.50)
Chloride: 105 mEq/L (ref 96–112)
GFR: 70.14 mL/min (ref >60.00)
GLUCOSE: 95 mg/dL (ref 70–99)
POTASSIUM: 4.1 meq/L (ref 3.5–5.1)
Phosphorus: 3.1 mg/dL (ref 2.3–4.6)
SODIUM: 140 meq/L (ref 135–145)

## 2015-07-24 NOTE — Assessment & Plan Note (Signed)
Probably related to dehydration working out in [employer] recheck labs

## 2015-07-24 NOTE — Progress Notes (Signed)
Pre visit review using our clinic review tool, if applicable. No additional management support is needed unless otherwise documented below in the visit note. 

## 2015-07-24 NOTE — Patient Instructions (Signed)
You must stop smoking!

## 2015-07-24 NOTE — Progress Notes (Signed)
   Subjective:    Patient ID: Russell Garrett, male    DOB: 1951-03-22, 64 y.o.   MRN: [id number]  HPI Here with wife for ED follow up Had been working on his barn and thinks he overheated Would sit a minute and then go back to work (up and down a ladder, etc)  More ankle swelling in past month or so DOE clearly recently. Thinks he gets overheated then also Did gain weight on vacation to [place]-- then has lost some of that weight No chest pain No palpitations Edema is mid day or evening. Depends on how much he is on his feet  Sleeps flat in bed. No PND  Current Outpatient Prescriptions on File Prior to Visit  Medication Sig Dispense Refill  . aspirin 81 MG tablet Take 81 mg by mouth daily.      Marland Kitchen atorvastatin (LIPITOR) 40 MG tablet Take 1 tablet (40 mg total) by mouth daily. 30 tablet 11  . losartan-hydrochlorothiazide (HYZAAR) 50-12.5 MG per tablet Take 1 tablet by mouth daily. 30 tablet 11  . Multiple Vitamins-Minerals (CENTRUM SILVER ADULT 50+ PO) Take 1 tablet by mouth daily at 12 noon.    . [DISCONTINUED] lisinopril-hydrochlorothiazide (PRINZIDE,ZESTORETIC) 10-12.5 MG per tablet Take 1 tablet by mouth daily. 30 tablet 11   No current facility-administered medications on file prior to visit.    No Known Allergies  Past Medical History  Diagnosis Date  . Diverticulitis   . Hyperlipidemia   . Hypertension   . OSA (obstructive sleep apnea)     Past Surgical History  Procedure Laterality Date  . Thoracentesis  08/2006    left pleural effusion  . Back surgery      Family History  Problem Relation Age of Onset  . Hypertension Mother   . Heart disease Father   . Stroke Father   . Cancer Father   . Diabetes Neg Hx   . Heart disease Brother     cardioversion for arrhythmia    Social History   Social History  . Marital Status: Married    Spouse Name: N/A  . Number of Children: 2  . Years of Education: N/A   Occupational History  . 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 Smoking status: Current Every Day Smoker -- 1.00 packs/day for 35 years    Types: Cigarettes  . Smokeless tobacco: Never Used  . Alcohol Use: Yes     Comment: occ  . Drug Use: No  . Sexual Activity: Not on file   Other Topics Concern  . Not on file   Social History Narrative   Widowed   Remarried June 2014   Review of Systems Appetite is fine Sleeping well    Objective:   Physical Exam  Constitutional: He appears well-developed and well-nourished. No distress.  Neck: Normal range of motion. Neck supple. No thyromegaly present.  Cardiovascular: Normal rate, regular rhythm, normal heart sounds and intact distal pulses.  Exam reveals no gallop.   No murmur heard. Pulmonary/Chest: Effort normal and breath sounds normal. No respiratory distress. He has no wheezes. He has no rales.  Abdominal: Soft. There is no tenderness.  Musculoskeletal:  1+ ankle edema on right, trace on left  Lymphadenopathy:    He has no cervical adenopathy.  Psychiatric: He has a normal mood and affect. His behavior is normal.          Assessment & Plan:

## 2015-07-24 NOTE — Assessment & Plan Note (Addendum)
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

## 2015-07-31 ENCOUNTER — Ambulatory Visit (INDEPENDENT_AMBULATORY_CARE_PROVIDER_SITE_OTHER): Payer: No Typology Code available for payment source

## 2015-07-31 DIAGNOSIS — R0609 Other forms of dyspnea: Secondary | ICD-10-CM | POA: Diagnosis not present

## 2015-08-01 LAB — EXERCISE TOLERANCE TEST
CSEPEW: 8.9 METS
CSEPHR: 91 %
CSEPPHR: 142 {beats}/min
Exercise duration (min): 7 min
Exercise duration (sec): 18 s
MPHR: 156 {beats}/min
Rest HR: 66 {beats}/min

## 2016-03-10 ENCOUNTER — Telehealth: Payer: Self-pay | Admitting: Internal Medicine

## 2016-03-10 NOTE — Telephone Encounter (Signed)
Patient Name: Russell Garrett DOB: Dec 15, 1950 Initial Comment Caller states husband has had acute symptoms. Stopped smoking in Jan. has gained 15 lbs , having joint pain and not feeling good. Has appt on Friday, wants to know if he should be seen sooner Nurse Assessment Nurse: Vallery Sa, RN, [place] Date/Time (Eastern Time): 03/10/2016 10:27:49 AM Confirm and document reason for call. If symptomatic, describe symptoms. You must click the next button to save text entered. ---Juliann Pulse states [name] stopped smoking in January 2017 and he has gained about 15 pounds. He developed pain in his hips (rated as a 3/4 on the 1 to 10 scale), knees (pain rated as a 4) and hands (rated as 1) about a week ago. No shortness of breath or chest pain. No fever. No injury in the past week. Has the patient traveled out of the country within the last 30 days? ---No Does the patient have any new or worsening symptoms? ---Yes Will a triage be completed? ---Yes Related visit to physician within the last 2 weeks? ---No Does the PT have any chronic conditions? (i.e. diabetes, asthma, etc.) ---Yes List chronic conditions. ---High Blood Pressure and Cholesterol Is this a behavioral health or substance abuse call? ---No Guidelines Guideline Title Affirmed Question Affirmed Notes Hip Pain [1] MODERATE pain (e.g., interferes with normal activities, limping) AND [2] present > 3 days Knee Pain [1] MODERATE pain (e.g., interferes with normal activities, limping) AND [2] present > 3 days Hand and Wrist Pain Weakness (i.e., loss of strength) of new onset in hand or fingers (Exceptions: not truly weak, hand feels weak because of pain; weakness present > 2 weeks) Final Disposition User See PCP When Office is Open (within 3 days) [place], RN, [employer] states that when her husband comes inside from [place] that she will call back if he will accept an appointment with another MD. Referrals GO TO FACILITY OTHER -  SPECIFY

## 2016-03-10 NOTE — Telephone Encounter (Signed)
Pt has appt with Dr Silvio Pate on 03/14/16 at 9:30.

## 2016-03-10 NOTE — Telephone Encounter (Signed)
Will see on Friday--that sounds acceptable

## 2016-03-12 ENCOUNTER — Other Ambulatory Visit: Payer: Self-pay | Admitting: Internal Medicine

## 2016-03-14 ENCOUNTER — Ambulatory Visit (INDEPENDENT_AMBULATORY_CARE_PROVIDER_SITE_OTHER): Payer: No Typology Code available for payment source | Admitting: Internal Medicine

## 2016-03-14 ENCOUNTER — Encounter: Payer: Self-pay | Admitting: Internal Medicine

## 2016-03-14 VITALS — BP 118/78 | HR 60 | Temp 97.4°F | Ht 73.25 in | Wt 266.0 lb

## 2016-03-14 DIAGNOSIS — I10 Essential (primary) hypertension: Secondary | ICD-10-CM | POA: Diagnosis not present

## 2016-03-14 DIAGNOSIS — Z0000 Encounter for general adult medical examination without abnormal findings: Secondary | ICD-10-CM

## 2016-03-14 DIAGNOSIS — E785 Hyperlipidemia, unspecified: Secondary | ICD-10-CM

## 2016-03-14 MED ORDER — LOSARTAN POTASSIUM-HCTZ 50-12.5 MG PO TABS: 1.0000 | ORAL_TABLET | Freq: Every day | ORAL | Status: DC

## 2016-03-14 MED ORDER — ATORVASTATIN CALCIUM 40 MG PO TABS: 40.0000 mg | ORAL_TABLET | Freq: Every day | ORAL | Status: DC

## 2016-03-14 NOTE — Progress Notes (Signed)
Subjective:    Patient ID: Russell Garrett, male    DOB: 07-Feb-1951, 65 y.o.   MRN: [id number]  HPI Here for physical  Having pain in his hands--- notices it in AM Mostly ring fingers--sore when closing hands. PIP joints Loosens up after an hour or two No swelling  Issues with back again Feels it is sciatica--now shooting down left leg Chiropractic better Also with left knee pain--fell one step off ladder Stiff after sitting and better after moving around  Hard doing exericse---will have pain quickly on elliptical Looking into fitness program at [place] smoking 3 months ago Has gained some weight--but is getting back on top of that Feels he eats healthy  Current Outpatient Prescriptions on File Prior to Visit  Medication Sig Dispense Refill  . aspirin 81 MG tablet Take 81 mg by mouth daily.      Marland Kitchen atorvastatin (LIPITOR) 40 MG tablet TAKE ONE TABLET BY MOUTH EVERY DAY 30 tablet 0  . losartan-hydrochlorothiazide (HYZAAR) 50-12.5 MG tablet TAKE ONE TABLET BY MOUTH EVERY DAY 30 tablet 0  . Multiple Vitamins-Minerals (CENTRUM SILVER ADULT 50+ PO) Take 1 tablet by mouth daily at 12 noon.    . [DISCONTINUED] lisinopril-hydrochlorothiazide (PRINZIDE,ZESTORETIC) 10-12.5 MG per tablet Take 1 tablet by mouth daily. 30 tablet 11   No current facility-administered medications on file prior to visit.    No Known Allergies  Past Medical History  Diagnosis Date  . Diverticulitis   . Hyperlipidemia   . Hypertension   . OSA (obstructive sleep apnea)     Past Surgical History  Procedure Laterality Date  . Thoracentesis  08/2006    left pleural effusion  . Back surgery      Family History  Problem Relation Age of Onset  . Hypertension Mother   . Heart disease Father   . Stroke Father   . Cancer Father   . Diabetes Neg Hx   . Heart disease Brother     cardioversion for arrhythmia    Social History   Social History  . Marital Status: Married    Spouse  Name: N/A  . Number of Children: 2  . Years of Education: N/A   Occupational History  . 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 Smoking status: Former Smoker -- 1.00 packs/day for 35 years    Types: Cigarettes    Quit date: 12/16/2015  . Smokeless tobacco: Never Used  . Alcohol Use: 0.0 oz/week    0 Standard drinks or equivalent per week     Comment: occ  . Drug Use: No  . Sexual Activity: Not on file   Other Topics Concern  . Not on file   Social History Narrative   Widowed   Remarried June 2014   Review of Systems  Constitutional: Negative for fatigue.       Wears seat belt  HENT: Negative for dental problem, hearing loss and tinnitus.        Recent broken tooth-- getting that fixed  Eyes: Negative for visual disturbance.       No diplopia or unilateral vision loss  Respiratory: Negative for cough, chest tightness and shortness of breath.   Cardiovascular: Positive for leg swelling. Negative for chest pain and palpitations.  Gastrointestinal: Negative for nausea, vomiting, abdominal pain, constipation and blood in stool.       No heartburn  Endocrine: Negative for polydipsia and polyuria.  Genitourinary: Negative for  urgency and difficulty urinating.       No sexual problems  Musculoskeletal: Positive for back pain and arthralgias. Negative for joint swelling.  Skin: Negative for rash.       No suspicious lesions  Allergic/Immunologic: Negative for environmental allergies and immunocompromised state.  Neurological: Negative for dizziness, syncope, weakness, light-headedness and headaches.  Hematological: Negative for adenopathy. Does not bruise/bleed easily.  Psychiatric/Behavioral: Negative for sleep disturbance and dysphoric mood. The patient is not nervous/anxious.        Objective:   Physical Exam  Constitutional: He is oriented to person, place, and time. He appears well-developed and well-nourished. No distress.  HENT:  Head:  Normocephalic and atraumatic.  Right Ear: External ear normal.  Left Ear: External ear normal.  Mouth/Throat: Oropharynx is clear and moist. No oropharyngeal exudate.  Eyes: Conjunctivae are normal. Pupils are equal, round, and reactive to light.  Neck: Normal range of motion. Neck supple. No thyromegaly present.  Cardiovascular: Normal rate, regular rhythm, normal heart sounds and intact distal pulses.  Exam reveals no gallop.   No murmur heard. Pulmonary/Chest: Effort normal and breath sounds normal. No respiratory distress. He has no wheezes. He has no rales.  Abdominal: Soft. There is no tenderness.  Musculoskeletal:  Trace calf/ankle edema No inflammatory arthritis or knee effusion  Lymphadenopathy:    He has no cervical adenopathy.  Neurological: He is alert and oriented to person, place, and time.  Skin: No rash noted. No erythema.  Psychiatric: He has a normal mood and affect. His behavior is normal.          Assessment & Plan:

## 2016-03-14 NOTE — Assessment & Plan Note (Signed)
Fairly healthy but needs to work on fitness/weight loss Yearly flu vaccine Due for colon next year Recent labs for insurance--will get those and defer blood work today

## 2016-03-14 NOTE — Progress Notes (Signed)
Pre visit review using our clinic review tool, if applicable. No additional management support is needed unless otherwise documented below in the visit note. 

## 2016-03-14 NOTE — Assessment & Plan Note (Signed)
Lab Results  Component Value Date   LDLCALC 100* 03/07/2015   Doing well with statin

## 2016-03-14 NOTE — Assessment & Plan Note (Signed)
BP Readings from Last 3 Encounters:  03/14/16 118/78  07/24/15 112/70  07/19/15 131/79   Good control

## 2016-03-17 ENCOUNTER — Telehealth: Payer: Self-pay | Admitting: Internal Medicine

## 2016-03-17 NOTE — Telephone Encounter (Signed)
Reviewed ---all looks okay Nicotine positive---expected

## 2016-03-17 NOTE — Telephone Encounter (Signed)
Labs placed in Dr Alla German InBox on his desk

## 2016-03-17 NOTE — Telephone Encounter (Signed)
Pt dropped off labs that were done outside . Placing in rx tower. Pt has upcoming cpe appt  Thanks

## 2016-04-25 ENCOUNTER — Encounter: Payer: Self-pay | Admitting: Internal Medicine

## 2016-04-25 ENCOUNTER — Ambulatory Visit (INDEPENDENT_AMBULATORY_CARE_PROVIDER_SITE_OTHER): Payer: No Typology Code available for payment source | Admitting: Internal Medicine

## 2016-04-25 VITALS — BP 102/80 | HR 69 | Temp 97.4°F | Wt 261.0 lb

## 2016-04-25 DIAGNOSIS — R5383 Other fatigue: Secondary | ICD-10-CM | POA: Diagnosis not present

## 2016-04-25 LAB — COMPREHENSIVE METABOLIC PANEL
ALBUMIN: 4.4 g/dL (ref 3.5–5.2)
ALK PHOS: 77 U/L (ref 39–117)
ALT: 34 U/L (ref 0–53)
AST: 38 U/L — ABNORMAL HIGH (ref 0–37)
BILIRUBIN TOTAL: 0.7 mg/dL (ref 0.2–1.2)
BUN: 24 mg/dL — ABNORMAL HIGH (ref 6–23)
CO2: 29 mEq/L (ref 19–32)
Calcium: 9 mg/dL (ref 8.4–10.5)
Chloride: 104 mEq/L (ref 96–112)
Creatinine, Ser: 1.2 mg/dL (ref 0.40–1.50)
GFR: 64.61 mL/min (ref >60.00)
Glucose, Bld: 99 mg/dL (ref 70–99)
POTASSIUM: 4.3 meq/L (ref 3.5–5.1)
SODIUM: 142 meq/L (ref 135–145)
Total Protein: 7.7 g/dL (ref 6.0–8.3)

## 2016-04-25 LAB — CBC WITH DIFFERENTIAL/PLATELET
BASOS PCT: 0.7 % (ref 0.0–3.0)
Basophils Absolute: 0 10*3/uL (ref 0.0–0.1)
EOS ABS: 0.1 10*3/uL (ref 0.0–0.7)
Eosinophils Relative: 1.5 % (ref 0.0–5.0)
HCT: 45 % (ref 39.0–52.0)
Hemoglobin: 15.3 g/dL (ref 13.0–17.0)
Lymphocytes Relative: 32.9 % (ref 12.0–46.0)
Lymphs Abs: 1.4 10*3/uL (ref 0.7–4.0)
MCHC: 34 g/dL (ref 30.0–36.0)
MCV: 89.4 fl (ref 78.0–100.0)
MONO ABS: 0.7 10*3/uL (ref 0.1–1.0)
Monocytes Relative: 16.5 % — ABNORMAL HIGH (ref 3.0–12.0)
NEUTROS ABS: 2 10*3/uL (ref 1.4–7.7)
Neutrophils Relative %: 48.4 % (ref 43.0–77.0)
PLATELETS: 131 10*3/uL — AB (ref 150.0–400.0)
RBC: 5.03 Mil/uL (ref 4.22–5.81)
RDW: 12.9 % (ref 11.5–15.5)
WBC: 4.2 10*3/uL (ref 4.0–10.5)

## 2016-04-25 LAB — T4, FREE: FREE T4: 0.83 ng/dL (ref 0.60–1.60)

## 2016-04-25 LAB — SEDIMENTATION RATE: Sed Rate: 8 mm/hr (ref 0–20)

## 2016-04-25 NOTE — Assessment & Plan Note (Signed)
Non specific Nothing to suggest tick borne disease No PE findings to suggest bacterial infection---lung, urine, skin, etc ??viral mosquito borne illness? Seems to be self limited in any case Will check labs Would reevaluate if not improving in a couple of weeks

## 2016-04-25 NOTE — Progress Notes (Signed)
Pre visit review using our clinic review tool, if applicable. No additional management support is needed unless otherwise documented below in the visit note. 

## 2016-04-25 NOTE — Progress Notes (Signed)
Subjective:    Patient ID: Russell Garrett, male    DOB: 1951/08/28, 65 y.o.   MRN: [id number]  HPI Here due to fatigue for a couple of weeks Flu like symptoms---low grade fever, off and on diarrhea, fatigue Mood is off  Had 4 tick bites--- 2-3 weeks ago. Not engorged but might have been on up to 1 day No rash No headache Some cough--- dry No SOB No skin ulcers Knee and hip aching--no difference No joint swelling  Current Outpatient Prescriptions on File Prior to Visit  Medication Sig Dispense Refill  . aspirin 81 MG tablet Take 81 mg by mouth daily.      Marland Kitchen atorvastatin (LIPITOR) 40 MG tablet Take 1 tablet (40 mg total) by mouth daily. 30 tablet 11  . losartan-hydrochlorothiazide (HYZAAR) 50-12.5 MG tablet Take 1 tablet by mouth daily. 30 tablet 11  . Multiple Vitamins-Minerals (CENTRUM SILVER ADULT 50+ PO) Take 1 tablet by mouth daily at 12 noon.    . [DISCONTINUED] lisinopril-hydrochlorothiazide (PRINZIDE,ZESTORETIC) 10-12.5 MG per tablet Take 1 tablet by mouth daily. 30 tablet 11   No current facility-administered medications on file prior to visit.    No Known Allergies  Past Medical History  Diagnosis Date  . Diverticulitis   . Hyperlipidemia   . Hypertension   . OSA (obstructive sleep apnea)     Past Surgical History  Procedure Laterality Date  . Thoracentesis  08/2006    left pleural effusion  . Back surgery      Family History  Problem Relation Age of Onset  . Hypertension Mother   . Heart disease Father   . Stroke Father   . Cancer Father   . Diabetes Neg Hx   . Heart disease Brother     cardioversion for arrhythmia    Social History   Social History  . Marital Status: Married    Spouse Name: N/A  . Number of Children: 2  . Years of Education: N/A   Occupational History  . 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 Smoking status: Former Smoker -- 1.00 packs/day for 35 years    Types: Cigarettes    Quit date:  12/16/2015  . Smokeless tobacco: Never Used  . Alcohol Use: 0.0 oz/week    0 Standard drinks or equivalent per week     Comment: occ  . Drug Use: No  . Sexual Activity: Not on file   Other Topics Concern  . Not on file   Social History Narrative   Widowed   Remarried June 2014   Review of Systems Had dental implant done about 4 weeks ago--brief antibiotics. Had recheck and all okay with this Not sleeping as well as usual Has lost 5# No dysuria or hematuria     Objective:   Physical Exam  Constitutional: He appears well-developed and well-nourished. No distress.  HENT:  Mouth/Throat: Oropharynx is clear and moist. No oropharyngeal exudate.  Neck: Normal range of motion. Neck supple. No thyromegaly present.  Cardiovascular: Normal rate, regular rhythm, normal heart sounds and intact distal pulses.  Exam reveals no gallop.   No murmur heard. Pulmonary/Chest: Effort normal and breath sounds normal. No respiratory distress. He has no wheezes. He has no rales.  Abdominal: Soft. He exhibits no distension and no mass. There is no tenderness. There is no rebound and no guarding.   No HSM  Musculoskeletal: He exhibits no edema.  No joint swelling  Lymphadenopathy:  He has no cervical adenopathy.    He has no axillary adenopathy.       Right: No inguinal adenopathy present.       Left: No inguinal adenopathy present.  Skin: No rash noted.  3 tick sites are slightly red without induration or inflammation          Assessment & Plan:

## 2016-12-03 DIAGNOSIS — H2513 Age-related nuclear cataract, bilateral: Secondary | ICD-10-CM | POA: Diagnosis not present

## 2016-12-29 ENCOUNTER — Encounter: Payer: Self-pay | Admitting: Gastroenterology

## 2017-03-17 ENCOUNTER — Encounter: Payer: Self-pay | Admitting: Internal Medicine

## 2017-03-17 ENCOUNTER — Ambulatory Visit (INDEPENDENT_AMBULATORY_CARE_PROVIDER_SITE_OTHER): Payer: PPO | Admitting: Internal Medicine

## 2017-03-17 ENCOUNTER — Telehealth: Payer: Self-pay

## 2017-03-17 VITALS — BP 128/90 | HR 70 | Temp 98.3°F | Ht 73.0 in | Wt 280.0 lb

## 2017-03-17 DIAGNOSIS — G4733 Obstructive sleep apnea (adult) (pediatric): Secondary | ICD-10-CM

## 2017-03-17 DIAGNOSIS — Z1159 Encounter for screening for other viral diseases: Secondary | ICD-10-CM | POA: Diagnosis not present

## 2017-03-17 DIAGNOSIS — Z23 Encounter for immunization: Secondary | ICD-10-CM | POA: Diagnosis not present

## 2017-03-17 DIAGNOSIS — M25561 Pain in right knee: Secondary | ICD-10-CM | POA: Diagnosis not present

## 2017-03-17 DIAGNOSIS — I10 Essential (primary) hypertension: Secondary | ICD-10-CM

## 2017-03-17 DIAGNOSIS — Z0001 Encounter for general adult medical examination with abnormal findings: Secondary | ICD-10-CM

## 2017-03-17 DIAGNOSIS — Z0000 Encounter for general adult medical examination without abnormal findings: Secondary | ICD-10-CM

## 2017-03-17 DIAGNOSIS — G8929 Other chronic pain: Secondary | ICD-10-CM

## 2017-03-17 DIAGNOSIS — E785 Hyperlipidemia, unspecified: Secondary | ICD-10-CM | POA: Diagnosis not present

## 2017-03-17 LAB — CBC WITH DIFFERENTIAL/PLATELET
BASOS PCT: 2.3 % (ref 0.0–3.0)
Basophils Absolute: 0.1 10*3/uL (ref 0.0–0.1)
EOS PCT: 7.1 % — AB (ref 0.0–5.0)
Eosinophils Absolute: 0.3 10*3/uL (ref 0.0–0.7)
HCT: 44.7 % (ref 39.0–52.0)
Hemoglobin: 15 g/dL (ref 13.0–17.0)
LYMPHS ABS: 1.3 10*3/uL (ref 0.7–4.0)
Lymphocytes Relative: 28 % (ref 12.0–46.0)
MCHC: 33.4 g/dL (ref 30.0–36.0)
MCV: 89 fl (ref 78.0–100.0)
MONOS PCT: 11.1 % (ref 3.0–12.0)
Monocytes Absolute: 0.5 10*3/uL (ref 0.1–1.0)
NEUTROS PCT: 51.5 % (ref 43.0–77.0)
Neutro Abs: 2.3 10*3/uL (ref 1.4–7.7)
Platelets: 169 10*3/uL (ref 150.0–400.0)
RBC: 5.02 Mil/uL (ref 4.22–5.81)
RDW: 14 % (ref 11.5–15.5)
WBC: 4.5 10*3/uL (ref 4.0–10.5)

## 2017-03-17 LAB — COMPREHENSIVE METABOLIC PANEL
ALT: 34 U/L (ref 0–53)
AST: 24 U/L (ref 0–37)
Albumin: 4.4 g/dL (ref 3.5–5.2)
Alkaline Phosphatase: 76 U/L (ref 39–117)
BILIRUBIN TOTAL: 0.8 mg/dL (ref 0.2–1.2)
BUN: 22 mg/dL (ref 6–23)
CALCIUM: 9.5 mg/dL (ref 8.4–10.5)
CHLORIDE: 106 meq/L (ref 96–112)
CO2: 29 meq/L (ref 19–32)
CREATININE: 1.05 mg/dL (ref 0.40–1.50)
GFR: 75.17 mL/min (ref >60.00)
Glucose, Bld: 101 mg/dL — ABNORMAL HIGH (ref 70–99)
Potassium: 4.2 mEq/L (ref 3.5–5.1)
Sodium: 142 mEq/L (ref 135–145)
Total Protein: 7.3 g/dL (ref 6.0–8.3)

## 2017-03-17 LAB — LIPID PANEL
Cholesterol: 176 mg/dL (ref 0–200)
HDL: 47.8 mg/dL (ref >39.00)
LDL Cholesterol: 115 mg/dL — ABNORMAL HIGH (ref 0–99)
NONHDL: 128.57
TRIGLYCERIDES: 69 mg/dL (ref 0.0–149.0)
Total CHOL/HDL Ratio: 4
VLDL: 13.8 mg/dL (ref 0.0–40.0)

## 2017-03-17 LAB — PSA: PSA: 1.08 ng/mL (ref 0.10–4.00)

## 2017-03-17 MED ORDER — ATORVASTATIN CALCIUM 40 MG PO TABS: 40.0000 mg | ORAL_TABLET | Freq: Every day | ORAL | 11 refills | Status: DC

## 2017-03-17 MED ORDER — LOSARTAN POTASSIUM-HCTZ 50-12.5 MG PO TABS: 1.0000 | ORAL_TABLET | Freq: Every day | ORAL | 11 refills | Status: DC

## 2017-03-17 NOTE — Telephone Encounter (Signed)
Pt has been advised and pre visit and colon scheduled.  Information for pre visit sent to the pt via My Chart

## 2017-03-17 NOTE — Patient Instructions (Signed)
DASH Eating Plan DASH stands for "Dietary Approaches to Stop Hypertension." The DASH eating plan is a healthy eating plan that has been shown to reduce high blood pressure (hypertension). It may also reduce your risk for type 2 diabetes, heart disease, and stroke. The DASH eating plan may also help with weight loss. What are tips for following this plan? General guidelines  Avoid eating more than 2,300 mg (milligrams) of salt (sodium) a day. If you have hypertension, you may need to reduce your sodium intake to 1,500 mg a day.  Limit alcohol intake to no more than 1 drink a day for nonpregnant women and 2 drinks a day for men. One drink equals 12 oz of beer, 5 oz of wine, or 1 oz of hard liquor.  Work with your health care provider to maintain a healthy body weight or to lose weight. Ask what an ideal weight is for you.  Get at least 30 minutes of exercise that causes your heart to beat faster (aerobic exercise) most days of the week. Activities may include walking, swimming, or biking.  Work with your health care provider or diet and nutrition specialist (dietitian) to adjust your eating plan to your individual calorie needs. Reading food labels  Check food labels for the amount of sodium per serving. Choose foods with less than 5 percent of the Daily Value of sodium. Generally, foods with less than 300 mg of sodium per serving fit into this eating plan.  To find whole grains, look for the word "whole" as the first word in the ingredient list. Shopping  Buy products labeled as "low-sodium" or "no salt added."  Buy fresh foods. Avoid canned foods and premade or frozen meals. Cooking  Avoid adding salt when cooking. Use salt-free seasonings or herbs instead of table salt or sea salt. Check with your health care provider or pharmacist before using salt substitutes.  Do not fry foods. Cook foods using healthy methods such as baking, boiling, grilling, and broiling instead.  Cook with  heart-healthy oils, such as olive, canola, soybean, or sunflower oil. Meal planning   Eat a balanced diet that includes: ? 5 or more servings of fruits and vegetables each day. At each meal, try to fill half of your plate with fruits and vegetables. ? Up to 6-8 servings of whole grains each day. ? Less than 6 oz of lean meat, poultry, or fish each day. A 3-oz serving of meat is about the same size as a deck of cards. One egg equals 1 oz. ? 2 servings of low-fat dairy each day. ? A serving of nuts, seeds, or beans 5 times each week. ? Heart-healthy fats. Healthy fats called Omega-3 fatty acids are found in foods such as flaxseeds and coldwater fish, like sardines, salmon, and mackerel.  Limit how much you eat of the following: ? Canned or prepackaged foods. ? Food that is high in trans fat, such as fried foods. ? Food that is high in saturated fat, such as fatty meat. ? Sweets, desserts, sugary drinks, and other foods with added sugar. ? Full-fat dairy products.  Do not salt foods before eating.  Try to eat at least 2 vegetarian meals each week.  Eat more home-cooked food and less restaurant, buffet, and fast food.  When eating at a restaurant, ask that your food be prepared with less salt or no salt, if possible. What foods are recommended? The items listed may not be a complete list. Talk with your dietitian about what   dietary choices are best for you. Grains Whole-grain or whole-wheat bread. Whole-grain or whole-wheat pasta. Brown rice. Oatmeal. Quinoa. Bulgur. Whole-grain and low-sodium cereals. Pita bread. Low-fat, low-sodium crackers. Whole-wheat flour tortillas. Vegetables Fresh or frozen vegetables (raw, steamed, roasted, or grilled). Low-sodium or reduced-sodium tomato and vegetable juice. Low-sodium or reduced-sodium tomato sauce and tomato paste. Low-sodium or reduced-sodium canned vegetables. Fruits All fresh, dried, or frozen fruit. Canned fruit in natural juice (without  added sugar). Meat and other protein foods Skinless chicken or turkey. Ground chicken or turkey. Pork with fat trimmed off. Fish and seafood. Egg whites. Dried beans, peas, or lentils. Unsalted nuts, nut butters, and seeds. Unsalted canned beans. Lean cuts of beef with fat trimmed off. Low-sodium, lean deli meat. Dairy Low-fat (1%) or fat-free (skim) milk. Fat-free, low-fat, or reduced-fat cheeses. Nonfat, low-sodium ricotta or cottage cheese. Low-fat or nonfat yogurt. Low-fat, low-sodium cheese. Fats and oils Soft margarine without trans fats. Vegetable oil. Low-fat, reduced-fat, or light mayonnaise and salad dressings (reduced-sodium). Canola, safflower, olive, soybean, and sunflower oils. Avocado. Seasoning and other foods Herbs. Spices. Seasoning mixes without salt. Unsalted popcorn and pretzels. Fat-free sweets. What foods are not recommended? The items listed may not be a complete list. Talk with your dietitian about what dietary choices are best for you. Grains Baked goods made with fat, such as croissants, muffins, or some breads. Dry pasta or rice meal packs. Vegetables Creamed or fried vegetables. Vegetables in a cheese sauce. Regular canned vegetables (not low-sodium or reduced-sodium). Regular canned tomato sauce and paste (not low-sodium or reduced-sodium). Regular tomato and vegetable juice (not low-sodium or reduced-sodium). Pickles. Olives. Fruits Canned fruit in a light or heavy syrup. Fried fruit. Fruit in cream or butter sauce. Meat and other protein foods Fatty cuts of meat. Ribs. Fried meat. Bacon. Sausage. Bologna and other processed lunch meats. Salami. Fatback. Hotdogs. Bratwurst. Salted nuts and seeds. Canned beans with added salt. Canned or smoked fish. Whole eggs or egg yolks. Chicken or turkey with skin. Dairy Whole or 2% milk, cream, and half-and-half. Whole or full-fat cream cheese. Whole-fat or sweetened yogurt. Full-fat cheese. Nondairy creamers. Whipped toppings.  Processed cheese and cheese spreads. Fats and oils Butter. Stick margarine. Lard. Shortening. Ghee. Bacon fat. Tropical oils, such as coconut, palm kernel, or palm oil. Seasoning and other foods Salted popcorn and pretzels. Onion salt, garlic salt, seasoned salt, table salt, and sea salt. Worcestershire sauce. Tartar sauce. Barbecue sauce. Teriyaki sauce. Soy sauce, including reduced-sodium. Steak sauce. Canned and packaged gravies. Fish sauce. Oyster sauce. Cocktail sauce. Horseradish that you find on the shelf. Ketchup. Mustard. Meat flavorings and tenderizers. Bouillon cubes. Hot sauce and Tabasco sauce. Premade or packaged marinades. Premade or packaged taco seasonings. Relishes. Regular salad dressings. Where to find more information:  National Heart, Lung, and Blood Institute: www.nhlbi.nih.gov  American Heart Association: www.heart.org Summary  The DASH eating plan is a healthy eating plan that has been shown to reduce high blood pressure (hypertension). It may also reduce your risk for type 2 diabetes, heart disease, and stroke.  With the DASH eating plan, you should limit salt (sodium) intake to 2,300 mg a day. If you have hypertension, you may need to reduce your sodium intake to 1,500 mg a day.  When on the DASH eating plan, aim to eat more fresh fruits and vegetables, whole grains, lean proteins, low-fat dairy, and heart-healthy fats.  Work with your health care provider or diet and nutrition specialist (dietitian) to adjust your eating plan to your individual   calorie needs. This information is not intended to replace advice given to you by your health care provider. Make sure you discuss any questions you have with your health care provider. Document Released: 10/30/2011 Document Revised: 11/03/2016 Document Reviewed: 11/03/2016 Elsevier Interactive Patient Education  2017 Elsevier Inc.  

## 2017-03-17 NOTE — Assessment & Plan Note (Signed)
I have personally reviewed the Medicare Annual Wellness questionnaire and have noted 1. The patient's medical and social history 2. Their use of alcohol, tobacco or illicit drugs 3. Their current medications and supplements 4. The patient's functional ability including ADL's, fall risks, home safety risks and hearing or visual             impairment. 5. Diet and physical activities 6. Evidence for depression or mood disorders  The patients weight, height, BMI and visual acuity have been recorded in the chart I have made referrals, counseling and provided education to the patient based review of the above and I have provided the pt with a written personalized care plan for preventive services.  I have provided you with a copy of your personalized plan for preventive services. Please take the time to review along with your updated medication list.  Gets flu shot yearly prevnar Due for colonoscopy Will check PSA Was a smoker ---will do AAA screen Discussed fitness and healthy eating Advanced directives reviewed

## 2017-03-17 NOTE — Assessment & Plan Note (Signed)
Uses the CPAP nightly May need new equipment

## 2017-03-17 NOTE — Assessment & Plan Note (Signed)
BP Readings from Last 3 Encounters:  03/17/17 128/90  04/25/16 102/80  03/14/16 118/78   Adequate control but needs to work on fitness

## 2017-03-17 NOTE — Addendum Note (Signed)
Addended by: Pilar Grammes on: 03/17/2017 12:16 PM   Modules accepted: Orders

## 2017-03-17 NOTE — Progress Notes (Signed)
Subjective:    Patient ID: Russell Garrett, male    DOB: [birth year], 66 y.o.   MRN: [id number]  HPI Here for initial Medicare wellness visit and follow up of chronic health conditions Reviewed form and advanced directives Reviewed other doctors Vision fine--just had formal eye exam last week Hearing also fine-- threshold 20-25dB in right ear and 20-40 dB in left. No functional issues. See hyperlink Occasional beer--but no more than once a week Not able to exercise Had 1 fall with injury No depression or anhedonia Independent with instrumental ADLs No significant memory issues  Concerned about his weight He doesn't feel it is from his eating--- he feels it is healthy Has gained 20# Not active [place] off ladder last spring (missed last step)--- having knee pain. Some back pain also Not even able to play golf Seeing chiropractor--helps a little Aleve occasionally --helps some ("gets me moving")  BP seems to be okay Doesn't check it  No chest pain or SOB No dizziness or syncope Some swelling in ankles--comes and goes with time on feet No palpitations No headaches  Still on cholesterol med No myalgia or GI problems  Current Outpatient Prescriptions on File Prior to Visit  Medication Sig Dispense Refill  . aspirin 81 MG tablet Take 81 mg by mouth daily.      Marland Kitchen atorvastatin (LIPITOR) 40 MG tablet Take 1 tablet (40 mg total) by mouth daily. 30 tablet 11  . losartan-hydrochlorothiazide (HYZAAR) 50-12.5 MG tablet Take 1 tablet by mouth daily. 30 tablet 11  . Multiple Vitamins-Minerals (CENTRUM SILVER ADULT 50+ PO) Take 1 tablet by mouth daily at 12 noon.    . [DISCONTINUED] lisinopril-hydrochlorothiazide (PRINZIDE,ZESTORETIC) 10-12.5 MG per tablet Take 1 tablet by mouth daily. 30 tablet 11   No current facility-administered medications on file prior to visit.     No Known Allergies  Past Medical History:  Diagnosis Date  . Diverticulitis   . Hyperlipidemia   . Hypertension     . OSA (obstructive sleep apnea)     Past Surgical History:  Procedure Laterality Date  . BACK SURGERY    . THORACENTESIS  08/2006   left pleural effusion    Family History  Problem Relation Age of Onset  . Hypertension Mother   . Heart disease Father   . Stroke Father   . Cancer Father   . Heart disease Brother     cardioversion for arrhythmia  . Diabetes Neg Hx     Social History   Social History  . Marital status: Married    Spouse name: N/A  . Number of children: 2  . Years of education: N/A   Occupational History  . 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 Smoking status: Former Smoker    Packs/day: 1.00    Years: 35.00    Types: Cigarettes    Quit date: 12/16/2015  . Smokeless tobacco: Never Used  . Alcohol use 0.0 oz/week     Comment: occ  . Drug use: No  . Sexual activity: Not on file   Other Topics Concern  . Not on file   Social History Narrative   Widowed   Remarried June 2014    Review of Systems Appetite is okay Sleeps fine---uses CPAP (not sure of pressure). Wears every night--all night with success Wears seat belt Teeth okay. Keeps up with Dr Sharlett Iles No heartburn or dysphagia Bowels are fine--no blood No rash or suspicious skin lesions.  Does have new sun sensitivity Voids okay    Objective:   Physical Exam  Constitutional: He is oriented to person, place, and time. He appears well-developed and well-nourished. No distress.  HENT:  Mouth/Throat: Oropharynx is clear and moist. No oropharyngeal exudate.  Neck: No thyromegaly present.  Cardiovascular: Normal rate, regular rhythm, normal heart sounds and intact distal pulses.  Exam reveals no gallop.   No murmur heard. Pulmonary/Chest: Effort normal and breath sounds normal. No respiratory distress. He has no wheezes. He has no rales.  Abdominal: Soft. There is no tenderness.  Musculoskeletal:  Trace ankle edema Right knee is not swollen. ?lateral  meniscus pain--but not consistent. No ligament findings  Lymphadenopathy:    He has no cervical adenopathy.  Neurological: He is alert and oriented to person, place, and time.  President-- "Trump, Obama, Bush" [phone] D-l-[id number]-w Recall 3/3  Skin: No rash noted. No erythema.  Psychiatric: He has a normal mood and affect. His behavior is normal.          Assessment & Plan:

## 2017-03-17 NOTE — Assessment & Plan Note (Signed)
Doing well on primary prevention

## 2017-03-17 NOTE — Progress Notes (Signed)
Pre visit review using our clinic review tool, if applicable. No additional management support is needed unless otherwise documented below in the visit note. 

## 2017-03-17 NOTE — Assessment & Plan Note (Signed)
Since minor fall last year Has kept him from exercise (along with back pain) Will set him up for sports medicine evaluation

## 2017-03-17 NOTE — Telephone Encounter (Signed)
-----   Message from Milus Banister, MD sent at 03/17/2017 12:10 PM EDT ----- Hoyt Koch reach out to him.  Thanks  Bartlett Enke, Can you contact him about colonoscopy for routine risk colon cancer screening, last colonoscopy was 10 years ago.  Thanks  dj  ----- Message ----- From: Venia Carbon, MD Sent: 03/17/2017  10:04 AM To: Milus Banister, MD  He is ready for his colonoscopy. Probably just had recall but didn't respond. You can have your staff contact him now. Rich

## 2017-03-18 LAB — HEPATITIS C ANTIBODY: HCV Ab: NEGATIVE

## 2017-03-23 ENCOUNTER — Ambulatory Visit (INDEPENDENT_AMBULATORY_CARE_PROVIDER_SITE_OTHER): Payer: PPO | Admitting: Family Medicine

## 2017-03-23 ENCOUNTER — Ambulatory Visit (INDEPENDENT_AMBULATORY_CARE_PROVIDER_SITE_OTHER)
Admission: RE | Admit: 2017-03-23 | Discharge: 2017-03-23 | Disposition: A | Discharge status: Home / Self Care | Payer: PPO | Source: Ambulatory Visit | Attending: Family Medicine | Admitting: Family Medicine

## 2017-03-23 ENCOUNTER — Encounter: Payer: Self-pay | Admitting: Family Medicine

## 2017-03-23 VITALS — BP 120/82 | HR 64 | Temp 97.8°F | Ht 73.0 in | Wt 284.0 lb

## 2017-03-23 DIAGNOSIS — G8929 Other chronic pain: Secondary | ICD-10-CM | POA: Diagnosis not present

## 2017-03-23 DIAGNOSIS — S8991XA Unspecified injury of right lower leg, initial encounter: Secondary | ICD-10-CM | POA: Diagnosis not present

## 2017-03-23 DIAGNOSIS — M25551 Pain in right hip: Secondary | ICD-10-CM | POA: Diagnosis not present

## 2017-03-23 DIAGNOSIS — M1611 Unilateral primary osteoarthritis, right hip: Secondary | ICD-10-CM | POA: Diagnosis not present

## 2017-03-23 DIAGNOSIS — S79911A Unspecified injury of right hip, initial encounter: Secondary | ICD-10-CM | POA: Diagnosis not present

## 2017-03-23 DIAGNOSIS — M25561 Pain in right knee: Secondary | ICD-10-CM | POA: Diagnosis not present

## 2017-03-23 NOTE — Progress Notes (Signed)
Pre visit review using our clinic review tool, if applicable. No additional management support is needed unless otherwise documented below in the visit note. 

## 2017-03-23 NOTE — Progress Notes (Signed)
Dr. Frederico Hamman T. Victor Langenbach, MD, [facility] [place], [address] Phone: [phone] Fax: [phone]  03/23/2017  Patient: Russell Garrett, MRN: [id number], DOB: 10-Jun-1951, 66 y.o.  Primary Physician:  Viviana Simpler, MD   Chief Complaint  Patient presents with  . Knee Pain    Right  . Hip Pain    Right  . Leg Swelling    Left   Subjective:   Russell Garrett is a 66 y.o. very pleasant male patient who presents with the following:  Pleasant patient who I have known well over 30 years who presents with right-sided knee pain, right-sided hip pain, and some mild edema of the lower extremities.  [place] off of a ladder in August and twisted his right knee. Has gotten a little worse over time. No significant knee history.  No surgeries or fractures.  He has tried over-the-counter anti-inflammatories and Tylenol.  He is also gained about 40 pounds in the last year.  Can walk some and will get some pain in the posterior hip on the right.  To his knowledge she has never had any hip trauma or injury, or fracture.  He is been having some intermittent hip pain, more in the last year.  Some relatively increased edema of bilateral lower extremities.  This is improved when he wears his compression stockings.  Salonpas cream on the R knee.   Past Medical History, Surgical History, Social History, Family History, Problem List, Medications, and Allergies have been reviewed and updated if relevant.  Patient Active Problem List   Diagnosis Date Noted  . Welcome to Medicare preventive visit 03/17/2017  . Right knee pain 03/17/2017  . Fatigue 04/25/2016  . Routine general medical examination at a health care facility 02/16/2012  . OBSTRUCTIVE SLEEP APNEA 08/01/2008  . Hyperlipemia 03/09/2007  . Essential hypertension, benign 03/09/2007  . DIVERTICULOSIS, COLON 03/09/2007    Past Medical History:  Diagnosis Date  . Diverticulitis   .  Hyperlipidemia   . Hypertension   . OSA (obstructive sleep apnea)     Past Surgical History:  Procedure Laterality Date  . BACK SURGERY    . THORACENTESIS  08/2006   left pleural effusion    Social History   Social History  . Marital status: Married    Spouse name: N/A  . Number of children: 2  . Years of education: N/A   Occupational History  . 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 Smoking status: Former Smoker    Packs/day: 1.00    Years: 35.00    Types: Cigarettes    Quit date: 12/16/2015  . Smokeless tobacco: Never Used  . Alcohol use 0.0 oz/week     Comment: occ  . Drug use: No  . Sexual activity: Not on file   Other Topics Concern  . Not on file   Social History Narrative   Widowed   Remarried June 2014      Has living will   Wife is health care POA   Would accept resuscitation    Probably no tube feeds if cognitively unaware.    Family History  Problem Relation Age of Onset  . Hypertension Mother   . Heart disease Father   . Stroke Father   . Cancer Father   . Heart disease Brother     cardioversion for arrhythmia  . Diabetes Neg Hx  No Known Allergies  Medication list reviewed and updated in full in [facility].  GEN: No fevers, chills. Nontoxic. Primarily MSK c/o today. MSK: Detailed in the HPI GI: tolerating PO intake without difficulty Neuro: No numbness, parasthesias, or tingling associated. Otherwise the pertinent positives of the ROS are noted above.   Objective:   BP 120/82   Pulse 64   Temp 97.8 F (36.6 C) (Oral)   Ht 6\' 1"  (1.854 m)   Wt 284 lb (128.8 kg)   BMI 37.47 kg/m    GEN: WDWN, NAD, Non-toxic, Alert & Oriented x 3 HEENT: Atraumatic, Normocephalic.  Ears and Nose: No external deformity. EXTR: No clubbing/cyanosis/tr LE edema NEURO: Normal gait.  PSYCH: Normally interactive. Conversant. Not depressed or anxious appearing.  Calm demeanor.   HIP EXAM: SIDE: R ROM:  Abduction, Flexion, Internal and External range of motion: abduction is limited to approximately 15.  With the hip flexed at 90, internal and external range of motion is limited to approximately 10-15 Pain with terminal IROM and EROM: increase pain with terminal internal range of motion GTB: NT SLR: NEG Knees: No effusion FABER: NT REVERSE FABER: NT, neg Piriformis: NT at direct palpation Str: flexion: 5/5 abduction: 5/5 adduction: 5/5 Strength testing non-tender  Knee:  R Gait: Normal heel toe pattern ROM: 0-120 Effusion: neg Echymosis or edema: none Patellar tendon NT Painful PLICA: neg Patellar grind: negative Medial and lateral patellar facet loading: negative medial and lateral joint lines: minimal Mcmurray's mild pain Flexion-pinch mild pain Varus and valgus stress: stable Lachman: neg Ant and Post drawer: neg Hip abduction, IR, ER: WNL Hip flexion str: 5/5 Hip abd: 5/5 Quad: 5/5 VMO atrophy:No Hamstring concentric and eccentric: 5/5   Radiology: Dg Knee Complete 4 Views Right  Result Date: 03/23/2017 CLINICAL DATA:  66 year old who fell off a ladder (1 rung) in fall, 2017, with chronic 6+ month history of right knee and right hip pain. No recent injuries. EXAM: RIGHT KNEE - COMPLETE 4+ VIEW COMPARISON:  None. FINDINGS: No evidence of acute, subacute or healed fractures. Well preserved joint spaces for age. Well-preserved bone mineral density. No intrinsic osseous abnormality. No evidence of a patellar tracking abnormality on the sunrise view. Mild femoropopliteal atherosclerosis. IMPRESSION: No osseous abnormality. Electronically Signed   By: Evangeline Dakin M.D.   On: 03/23/2017 13:20   Dg Hip Unilat With Pelvis 2-3 Views Right  Result Date: 03/23/2017 CLINICAL DATA:  66 year old who fell off a ladder (1 rung) in fall, 2017, with chronic 6+ month history of right knee and right hip pain. No recent injuries. EXAM: DG HIP (WITH OR WITHOUT PELVIS) 2-3V RIGHT  COMPARISON:  None. FINDINGS: No evidence of acute or subacute fracture or dislocation. Severe joint space narrowing with associated hypertrophic spurring involving the femoral head. Included AP pelvis demonstrates mild joint space narrowing in the contralateral left hip. Sacroiliac joints and symphysis pubis intact. Degenerative changes involving the visualized lower lumbar spine. IMPRESSION: Severe osteoarthritis involving the right hip. Electronically Signed   By: Evangeline Dakin M.D.   On: 03/23/2017 13:22     Assessment and Plan:   Primary osteoarthritis of right hip - Plan: DG HIP UNILAT WITH PELVIS 2-3 VIEWS RIGHT  Chronic pain of right knee - Plan: DG Knee Complete 4 Views Right  >25 minutes spent in face to face time with patient, >50% spent in counselling or coordination of care   Hip arthritis is notably significant.  At this point his pain is manageable.  Work on weight loss and activity that does not produce pain.  Right knee.  Plain films are very reassuring.  Exam relatively reassuring.  Aleve 2 tablets p.o. b.I.d. p.r.n.  Conservative management, primary goal now to lose weight and increase exercise patterns.  Edema more likely venous insufficiency.  Recommended increase activity and compression if needed  Follow-up: prn  Orders Placed This Encounter  Procedures  . DG Knee Complete 4 Views Right  . DG HIP UNILAT WITH PELVIS 2-3 VIEWS RIGHT    Signed,  Tyneisha Hegeman T. Dacota Devall, MD   Allergies as of 03/23/2017   No Known Allergies     Medication List       Accurate as of 03/23/17 11:59 PM. Always use your most recent med list.          aspirin 81 MG tablet Take 81 mg by mouth daily.   atorvastatin 40 MG tablet Commonly known as:  LIPITOR Take 1 tablet (40 mg total) by mouth daily.   CENTRUM SILVER ADULT 50+ PO Take 1 tablet by mouth daily at 12 noon.   losartan-hydrochlorothiazide 50-12.5 MG tablet Commonly known as:  HYZAAR Take 1 tablet by mouth  daily.

## 2017-03-24 DIAGNOSIS — M1611 Unilateral primary osteoarthritis, right hip: Secondary | ICD-10-CM | POA: Insufficient documentation

## 2017-04-16 DIAGNOSIS — I10 Essential (primary) hypertension: Secondary | ICD-10-CM | POA: Diagnosis not present

## 2017-04-16 DIAGNOSIS — Z136 Encounter for screening for cardiovascular disorders: Secondary | ICD-10-CM | POA: Diagnosis not present

## 2017-04-17 ENCOUNTER — Ambulatory Visit: Payer: PPO

## 2017-04-17 ENCOUNTER — Other Ambulatory Visit: Payer: Self-pay | Admitting: Internal Medicine

## 2017-04-17 DIAGNOSIS — Z136 Encounter for screening for cardiovascular disorders: Secondary | ICD-10-CM

## 2017-04-17 DIAGNOSIS — Z0000 Encounter for general adult medical examination without abnormal findings: Secondary | ICD-10-CM

## 2017-04-21 ENCOUNTER — Ambulatory Visit: Payer: PPO | Admitting: *Deleted

## 2017-04-21 VITALS — Ht 74.0 in | Wt 285.8 lb

## 2017-04-21 DIAGNOSIS — Z1211 Encounter for screening for malignant neoplasm of colon: Secondary | ICD-10-CM

## 2017-04-21 MED ORDER — NA SULFATE-K SULFATE-MG SULF 17.5-3.13-1.6 GM/177ML PO SOLN: ORAL | 0 refills | Status: DC

## 2017-04-21 NOTE — Progress Notes (Signed)
Pt denies allergies to eggs or soy products. Denies difficulty with sedation or anesthesia. Denies any diet or weight loss medications. Denies use of supplemental oxygen.  Emmi instructions given for procedure.  

## 2017-04-22 ENCOUNTER — Encounter: Payer: Self-pay | Admitting: Gastroenterology

## 2017-04-28 ENCOUNTER — Encounter: Payer: Self-pay | Admitting: Gastroenterology

## 2017-04-28 ENCOUNTER — Ambulatory Visit (AMBULATORY_SURGERY_CENTER): Payer: PPO | Admitting: Gastroenterology

## 2017-04-28 VITALS — BP 132/72 | HR 52 | Temp 96.8°F | Resp 16 | Ht 74.0 in | Wt 285.0 lb

## 2017-04-28 DIAGNOSIS — Z1212 Encounter for screening for malignant neoplasm of rectum: Secondary | ICD-10-CM | POA: Diagnosis not present

## 2017-04-28 DIAGNOSIS — Z1211 Encounter for screening for malignant neoplasm of colon: Secondary | ICD-10-CM | POA: Diagnosis not present

## 2017-04-28 DIAGNOSIS — K5730 Diverticulosis of large intestine without perforation or abscess without bleeding: Secondary | ICD-10-CM

## 2017-04-28 MED ORDER — SODIUM CHLORIDE 0.9 % IV SOLN: 500.0000 mL | INTRAVENOUS | Status: DC

## 2017-04-28 NOTE — Op Note (Signed)
[name] Procedure Date: 04/28/2017 7:41 AM MRN: [id number] Endoscopist: Milus Banister , MD Age: 66 Referring MD:  Date of Birth: 01-25-1951 Gender: Male Account #: [id number] Procedure:                Colonoscopy Indications:              Screening for colorectal malignant neoplasm Medicines:                Monitored Anesthesia Care Procedure:                Pre-Anesthesia Assessment:                           - Prior to the procedure, a History and Physical                            was performed, and patient medications and                            allergies were reviewed. The patient's tolerance of                            previous anesthesia was also reviewed. The risks                            and benefits of the procedure and the sedation                            options and risks were discussed with the patient.                            All questions were answered, and informed consent                            was obtained. Prior Anticoagulants: The patient has                            taken no previous anticoagulant or antiplatelet                            agents. ASA Grade Assessment: II - A patient with                            mild systemic disease. After reviewing the risks                            and benefits, the patient was deemed in                            satisfactory condition to undergo the procedure.                           After obtaining informed consent, the colonoscope  was passed under direct vision. Throughout the                            procedure, the patient's blood pressure, pulse, and                            oxygen saturations were monitored continuously. The                            Model CF-HQ190L [id number] scope was introduced                            through the anus and advanced to the the cecum,                            identified by  appendiceal orifice and ileocecal                            valve. The colonoscopy was performed without                            difficulty. The patient tolerated the procedure                            well. The quality of the bowel preparation was                            excellent. The ileocecal valve, appendiceal                            orifice, and rectum were photographed. Scope In: 8:04:23 AM Scope Out: 8:15:26 AM Scope Withdrawal Time: 0 hours 8 minutes 41 seconds  Total Procedure Duration: 0 hours 11 minutes 3 seconds  Findings:                 Multiple medium-mouthed diverticula were found in                            the left colon.                           The exam was otherwise without abnormality on                            direct and retroflexion views. Complications:            No immediate complications. Estimated blood loss:                            None. Estimated Blood Loss:     Estimated blood loss: none. Impression:               - Diverticulosis in the left colon.                           - The examination was otherwise normal on direct  and retroflexion views.                           - No polyps or cancers. Recommendation:           - Patient has a contact number available for                            emergencies. The signs and symptoms of potential                            delayed complications were discussed with the                            patient. Return to normal activities tomorrow.                            Written discharge instructions were provided to the                            patient.                           - Resume previous diet.                           - Continue present medications.                           - Repeat colonoscopy in 10 years for screening                            purposes. Milus Banister, MD 04/28/2017 8:18:15 AM This report has been signed electronically.

## 2017-04-28 NOTE — Progress Notes (Signed)
A and O x3. Report to RN. Tolerated MAC anesthesia well.

## 2017-04-28 NOTE — Progress Notes (Signed)
Pt's states no medical or surgical changes since previsit  

## 2017-04-28 NOTE — Patient Instructions (Signed)
Handout given : Diverticulosis.   YOU HAD AN ENDOSCOPIC PROCEDURE TODAY AT THE [facility]:   Refer to the procedure report that was given to you for any specific questions about what was found during the examination.  If the procedure report does not answer your questions, please call your gastroenterologist to clarify.  If you requested that your care partner not be given the details of your procedure findings, then the procedure report has been included in a sealed envelope for you to review at your convenience later.  YOU SHOULD EXPECT: Some feelings of bloating in the abdomen. Passage of more gas than usual.  Walking can help get rid of the air that was put into your GI tract during the procedure and reduce the bloating. If you had a lower endoscopy (such as a colonoscopy or flexible sigmoidoscopy) you may notice spotting of blood in your stool or on the toilet paper. If you underwent a bowel prep for your procedure, you may not have a normal bowel movement for a few days.  Please Note:  You might notice some irritation and congestion in your nose or some drainage.  This is from the oxygen used during your procedure.  There is no need for concern and it should clear up in a day or so.  SYMPTOMS TO REPORT IMMEDIATELY:   Following lower endoscopy (colonoscopy or flexible sigmoidoscopy):  Excessive amounts of blood in the stool  Significant tenderness or worsening of abdominal pains  Swelling of the abdomen that is new, acute  Fever of 100F or higher   For urgent or emergent issues, a gastroenterologist can be reached at any hour by calling [phone].   DIET:  We do recommend a small meal at first, but then you may proceed to your regular diet.  Drink plenty of fluids but you should avoid alcoholic beverages for 24 hours.  ACTIVITY:  You should plan to take it easy for the rest of today and you should NOT DRIVE or use heavy machinery until tomorrow (because of the  sedation medicines used during the test).    FOLLOW UP: Our staff will call the number listed on your records the next business day following your procedure to check on you and address any questions or concerns that you may have regarding the information given to you following your procedure. If we do not reach you, we will leave a message.  However, if you are feeling well and you are not experiencing any problems, there is no need to return our call.  We will assume that you have returned to your regular daily activities without incident.  If any biopsies were taken you will be contacted by phone or by letter within the next 1-3 weeks.  Please call us at [phone] if you have not heard about the biopsies in 3 weeks.    SIGNATURES/CONFIDENTIALITY: You and/or your care partner have signed paperwork which will be entered into your electronic medical record.  These signatures attest to the fact that that the information above on your After Visit Summary has been reviewed and is understood.  Full responsibility of the confidentiality of this discharge information lies with you and/or your care-partner. 

## 2017-04-29 ENCOUNTER — Telehealth: Payer: Self-pay | Admitting: *Deleted

## 2017-04-29 NOTE — Telephone Encounter (Signed)
  Follow up Call-  Call back number 04/28/2017  Post procedure Call Back phone  # [phone] home  Permission to leave phone message Yes  Some recent data might be hidden     Patient questions:  Do you have a fever, pain , or abdominal swelling? No. Pain Score  0 *  Have you tolerated food without any problems? Yes.    Have you been able to return to your normal activities? Yes.    Do you have any questions about your discharge instructions: Diet   No. Medications  No. Follow up visit  No.  Do you have questions or concerns about your Care? No.  Actions: * If pain score is 4 or above: No action needed, pain <4.

## 2018-02-22 DIAGNOSIS — T790XXA Air embolism (traumatic), initial encounter: Secondary | ICD-10-CM

## 2018-02-22 HISTORY — DX: Air embolism (traumatic), initial encounter: T79.0XXA

## 2018-02-25 ENCOUNTER — Inpatient Hospital Stay: Payer: PPO

## 2018-02-25 ENCOUNTER — Other Ambulatory Visit: Payer: Self-pay

## 2018-02-25 ENCOUNTER — Emergency Department: Payer: PPO

## 2018-02-25 ENCOUNTER — Inpatient Hospital Stay
Admission: EM | Admit: 2018-02-25 | Discharge: 2018-02-27 | DRG: 176 | Disposition: A | Discharge status: Home / Self Care | Payer: PPO | Attending: Internal Medicine | Admitting: Internal Medicine

## 2018-02-25 DIAGNOSIS — Z87891 Personal history of nicotine dependence: Secondary | ICD-10-CM | POA: Diagnosis not present

## 2018-02-25 DIAGNOSIS — M7989 Other specified soft tissue disorders: Secondary | ICD-10-CM | POA: Diagnosis not present

## 2018-02-25 DIAGNOSIS — M1990 Unspecified osteoarthritis, unspecified site: Secondary | ICD-10-CM | POA: Diagnosis not present

## 2018-02-25 DIAGNOSIS — Z823 Family history of stroke: Secondary | ICD-10-CM | POA: Diagnosis not present

## 2018-02-25 DIAGNOSIS — E669 Obesity, unspecified: Secondary | ICD-10-CM | POA: Diagnosis present

## 2018-02-25 DIAGNOSIS — Z7982 Long term (current) use of aspirin: Secondary | ICD-10-CM | POA: Diagnosis not present

## 2018-02-25 DIAGNOSIS — Z8042 Family history of malignant neoplasm of prostate: Secondary | ICD-10-CM | POA: Diagnosis not present

## 2018-02-25 DIAGNOSIS — I82412 Acute embolism and thrombosis of left femoral vein: Secondary | ICD-10-CM | POA: Diagnosis present

## 2018-02-25 DIAGNOSIS — I10 Essential (primary) hypertension: Secondary | ICD-10-CM | POA: Diagnosis not present

## 2018-02-25 DIAGNOSIS — R0781 Pleurodynia: Secondary | ICD-10-CM

## 2018-02-25 DIAGNOSIS — R079 Chest pain, unspecified: Secondary | ICD-10-CM | POA: Diagnosis not present

## 2018-02-25 DIAGNOSIS — Z8249 Family history of ischemic heart disease and other diseases of the circulatory system: Secondary | ICD-10-CM

## 2018-02-25 DIAGNOSIS — I878 Other specified disorders of veins: Secondary | ICD-10-CM | POA: Diagnosis not present

## 2018-02-25 DIAGNOSIS — Z6836 Body mass index (BMI) 36.0-36.9, adult: Secondary | ICD-10-CM | POA: Diagnosis not present

## 2018-02-25 DIAGNOSIS — I872 Venous insufficiency (chronic) (peripheral): Secondary | ICD-10-CM | POA: Diagnosis present

## 2018-02-25 DIAGNOSIS — M1611 Unilateral primary osteoarthritis, right hip: Secondary | ICD-10-CM | POA: Diagnosis present

## 2018-02-25 DIAGNOSIS — G4733 Obstructive sleep apnea (adult) (pediatric): Secondary | ICD-10-CM | POA: Diagnosis present

## 2018-02-25 DIAGNOSIS — I2699 Other pulmonary embolism without acute cor pulmonale: Principal | ICD-10-CM

## 2018-02-25 DIAGNOSIS — I82403 Acute embolism and thrombosis of unspecified deep veins of lower extremity, bilateral: Secondary | ICD-10-CM | POA: Diagnosis not present

## 2018-02-25 DIAGNOSIS — R9431 Abnormal electrocardiogram [ECG] [EKG]: Secondary | ICD-10-CM | POA: Diagnosis not present

## 2018-02-25 DIAGNOSIS — R0602 Shortness of breath: Secondary | ICD-10-CM | POA: Diagnosis not present

## 2018-02-25 DIAGNOSIS — E785 Hyperlipidemia, unspecified: Secondary | ICD-10-CM | POA: Diagnosis not present

## 2018-02-25 DIAGNOSIS — I82433 Acute embolism and thrombosis of popliteal vein, bilateral: Secondary | ICD-10-CM | POA: Diagnosis present

## 2018-02-25 DIAGNOSIS — Z841 Family history of disorders of kidney and ureter: Secondary | ICD-10-CM

## 2018-02-25 LAB — CBC
HCT: 40.8 % (ref 40.0–52.0)
HEMOGLOBIN: 13.8 g/dL (ref 13.0–18.0)
MCH: 30 pg (ref 26.0–34.0)
MCHC: 33.7 g/dL (ref 32.0–36.0)
MCV: 88.9 fL (ref 80.0–100.0)
Platelets: 131 10*3/uL — ABNORMAL LOW (ref 150–440)
RBC: 4.59 MIL/uL (ref 4.40–5.90)
RDW: 13.6 % (ref 11.5–14.5)
WBC: 7.8 10*3/uL (ref 3.8–10.6)

## 2018-02-25 LAB — PROTIME-INR
INR: 1.03
Prothrombin Time: 13.4 seconds (ref 11.4–15.2)

## 2018-02-25 LAB — BASIC METABOLIC PANEL
ANION GAP: 6 (ref 5–15)
BUN: 17 mg/dL (ref 6–20)
CALCIUM: 8.8 mg/dL — AB (ref 8.9–10.3)
CO2: 30 mmol/L (ref 22–32)
Chloride: 106 mmol/L (ref 101–111)
Creatinine, Ser: 1.17 mg/dL (ref 0.61–1.24)
GFR calc Af Amer: >60 mL/min (ref >60)
GLUCOSE: 110 mg/dL — AB (ref 65–99)
Potassium: 4.1 mmol/L (ref 3.5–5.1)
SODIUM: 142 mmol/L (ref 135–145)

## 2018-02-25 LAB — APTT: aPTT: 28 seconds (ref 24–36)

## 2018-02-25 LAB — HEPARIN LEVEL (UNFRACTIONATED): Heparin Unfractionated: 0.66 IU/mL (ref 0.30–0.70)

## 2018-02-25 LAB — FIBRIN DERIVATIVES D-DIMER (ARMC ONLY)

## 2018-02-25 LAB — BRAIN NATRIURETIC PEPTIDE: B Natriuretic Peptide: 33 pg/mL (ref 0.0–100.0)

## 2018-02-25 LAB — TROPONIN I

## 2018-02-25 MED ORDER — HEPARIN BOLUS VIA INFUSION
5000.0000 [IU] | Freq: Once | INTRAVENOUS | Status: AC
  Administered 2018-02-25: 5000 [IU] via INTRAVENOUS
  Filled 2018-02-25: qty 5000

## 2018-02-25 MED ORDER — ACETAMINOPHEN 325 MG PO TABS
650.0000 mg | ORAL_TABLET | Freq: Four times a day (QID) | ORAL | Status: DC | PRN
  Administered 2018-02-27: 650 mg via ORAL
  Filled 2018-02-25: qty 2

## 2018-02-25 MED ORDER — POLYETHYLENE GLYCOL 3350 17 G PO PACK: 17.0000 g | PACK | Freq: Every day | ORAL | Status: DC | PRN

## 2018-02-25 MED ORDER — ATORVASTATIN CALCIUM 20 MG PO TABS
40.0000 mg | ORAL_TABLET | Freq: Every day | ORAL | Status: DC
  Administered 2018-02-25 – 2018-02-26 (×2): 40 mg via ORAL
  Filled 2018-02-25 (×2): qty 2

## 2018-02-25 MED ORDER — HYDROCHLOROTHIAZIDE 12.5 MG PO CAPS
12.5000 mg | ORAL_CAPSULE | Freq: Every day | ORAL | Status: DC
  Administered 2018-02-25 – 2018-02-26 (×2): 12.5 mg via ORAL
  Filled 2018-02-25 (×2): qty 1

## 2018-02-25 MED ORDER — HEPARIN (PORCINE) IN NACL 100-0.45 UNIT/ML-% IJ SOLN
1800.0000 [IU]/h | INTRAMUSCULAR | Status: DC
  Administered 2018-02-25 (×2): 1900 [IU]/h via INTRAVENOUS
  Administered 2018-02-26 – 2018-02-27 (×2): 1800 [IU]/h via INTRAVENOUS
  Filled 2018-02-25 (×5): qty 250

## 2018-02-25 MED ORDER — CANNABIDIOL 100 MG/ML PO SOLN: 50.0000 mg | Freq: Every day | ORAL | Status: DC

## 2018-02-25 MED ORDER — HYDROCODONE-ACETAMINOPHEN 5-325 MG PO TABS
1.0000 | ORAL_TABLET | Freq: Four times a day (QID) | ORAL | Status: DC | PRN
  Administered 2018-02-25 – 2018-02-26 (×5): 2 via ORAL
  Administered 2018-02-27: 1 via ORAL
  Filled 2018-02-25 (×5): qty 2
  Filled 2018-02-25: qty 1

## 2018-02-25 MED ORDER — ONDANSETRON HCL 4 MG/2ML IJ SOLN: 4.0000 mg | Freq: Four times a day (QID) | INTRAMUSCULAR | Status: DC | PRN

## 2018-02-25 MED ORDER — DOCUSATE SODIUM 100 MG PO CAPS
100.0000 mg | ORAL_CAPSULE | Freq: Two times a day (BID) | ORAL | Status: DC
  Administered 2018-02-26 – 2018-02-27 (×3): 100 mg via ORAL
  Filled 2018-02-25 (×3): qty 1

## 2018-02-25 MED ORDER — IOHEXOL 350 MG/ML SOLN
75.0000 mL | Freq: Once | INTRAVENOUS | Status: AC | PRN
  Administered 2018-02-25: 75 mL via INTRAVENOUS

## 2018-02-25 MED ORDER — ACETAMINOPHEN 650 MG RE SUPP: 650.0000 mg | Freq: Four times a day (QID) | RECTAL | Status: DC | PRN

## 2018-02-25 MED ORDER — ONDANSETRON HCL 4 MG PO TABS: 4.0000 mg | ORAL_TABLET | Freq: Four times a day (QID) | ORAL | Status: DC | PRN

## 2018-02-25 MED ORDER — ASPIRIN EC 81 MG PO TBEC
81.0000 mg | DELAYED_RELEASE_TABLET | Freq: Every day | ORAL | Status: DC
  Administered 2018-02-25 – 2018-02-27 (×3): 81 mg via ORAL
  Filled 2018-02-25 (×3): qty 1

## 2018-02-25 MED ORDER — HYDROMORPHONE HCL 1 MG/ML IJ SOLN
0.5000 mg | Freq: Once | INTRAMUSCULAR | Status: AC
  Administered 2018-02-25: 0.5 mg via INTRAVENOUS
  Filled 2018-02-25: qty 1

## 2018-02-25 MED ORDER — LOSARTAN POTASSIUM 50 MG PO TABS
50.0000 mg | ORAL_TABLET | Freq: Every day | ORAL | Status: DC
  Administered 2018-02-25 – 2018-02-26 (×2): 50 mg via ORAL
  Filled 2018-02-25 (×2): qty 1

## 2018-02-25 MED ORDER — LOSARTAN POTASSIUM-HCTZ 50-12.5 MG PO TABS: 1.0000 | ORAL_TABLET | Freq: Every day | ORAL | Status: DC

## 2018-02-25 NOTE — Progress Notes (Signed)
Pt is complaining of pain and results of BLE ultrasound read to MD. Orders for hydrocodone given and MD acknowledged ultrasound results. I will continue to assess.

## 2018-02-25 NOTE — Consult Note (Signed)
[facility] Consult Note  MRN : [id number]  Russell Garrett is a 67 y.o. (March 14, 1951) male who presents with chief complaint of  Chief Complaint  Patient presents with  . Chest Pain  .  History of Present Illness:   I am asked to evaluate the patient by Dr. Posey Pronto for DVT with PE.  The patient is a 67 year old gentleman who presented to the emergency room earlier today with the abrupt onset of pleuritic-like chest pain.  Workup has demonstrated both DVT and PE.  The patient was noted in the emergency room to be hemodynamically stable and satting in the 90s on room air.  He denies feeling short of breath.  No hemoptysis.  There is no history of surgery travel or recent diagnosis of cancer.  Given his severe arthritic changes he does state that he has been more sedentary but nevertheless is ambulating around the house and his property on a daily basis.  No past history of DVT.  No family history of hypercoagulability.  Current Facility-Administered Medications  Medication Dose Route Frequency Provider Last Rate Last Dose  . acetaminophen (TYLENOL) tablet 650 mg  650 mg Oral Q6H PRN Fritzi Mandes, MD       Or  . acetaminophen (TYLENOL) suppository 650 mg  650 mg Rectal Q6H PRN Fritzi Mandes, MD      . aspirin EC tablet 81 mg  81 mg Oral Daily Fritzi Mandes, MD   81 mg at 02/25/18 1700  . atorvastatin (LIPITOR) tablet 40 mg  40 mg Oral Daily Fritzi Mandes, MD   40 mg at 02/25/18 1700  . docusate sodium (COLACE) capsule 100 mg  100 mg Oral BID Fritzi Mandes, MD      . heparin ADULT infusion 100 units/mL (25000 units/275mL sodium chloride 0.45%)  1,900 Units/hr Intravenous Continuous Candelaria Stagers, RPH 19 mL/hr at 02/25/18 1214 1,900 Units/hr at 02/25/18 1214  . losartan (COZAAR) tablet 50 mg  50 mg Oral Daily Fritzi Mandes, MD   50 mg at 02/25/18 1700   And  . hydrochlorothiazide (MICROZIDE) capsule 12.5 mg  12.5 mg Oral Daily Fritzi Mandes, MD   12.5 mg at 02/25/18 1700   . HYDROcodone-acetaminophen (NORCO/VICODIN) 5-325 MG per tablet 1-2 tablet  1-2 tablet Oral Q6H PRN Fritzi Mandes, MD   2 tablet at 02/25/18 1700  . ondansetron (ZOFRAN) tablet 4 mg  4 mg Oral Q6H PRN Fritzi Mandes, MD       Or  . ondansetron [facility]) injection 4 mg  4 mg Intravenous Q6H PRN Fritzi Mandes, MD      . polyethylene glycol (MIRALAX / GLYCOLAX) packet 17 g  17 g Oral Daily PRN Fritzi Mandes, MD        Past Medical History:  Diagnosis Date  . Arthritis    r hip  . 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 Diverticulitis    pt denies  . Hyperlipidemia   . Hypertension   . OSA (obstructive sleep apnea)   . Sleep apnea    CPAP    Past Surgical History:  Procedure Laterality Date  . BACK SURGERY    . COLONOSCOPY    . THORACENTESIS  08/2006   left pleural effusion    Social History Social History   Tobacco Use  . Smoking status: Former Smoker    Packs/day: 1.00    Years: 35.00    Pack years: 35.00    Types: Cigarettes  Last attempt to quit: 12/16/2015    Years since quitting: 2.1  . Smokeless tobacco: Never Used  Substance Use Topics  . Alcohol use: Yes    Alcohol/week: 0.0 oz    Comment: occ  . Drug use: No    Family History Family History  Problem Relation Age of Onset  . Hypertension Mother   . Heart disease Mother   . Kidney disease Mother   . Heart disease Father   . Stroke Father   . Prostate cancer Father   . Kidney disease Father   . Heart disease Brother        cardioversion for arrhythmia  . Diabetes Neg Hx   . Colon cancer Neg Hx   No family history of bleeding/clotting disorders, porphyria or autoimmune disease   No Known Allergies   REVIEW OF SYSTEMS (Negative unless checked)  Constitutional: [] Weight loss  [] Fever  [] Chills Cardiac: [] Chest pain   [] Chest pressure   [] Palpitations   [] Shortness of breath when laying flat   [] Shortness of breath at rest   [] Shortness of breath with exertion. Vascular:  [] Pain in  legs with walking   [] Pain in legs at rest   [] Pain in legs when laying flat   [] Claudication   [] Pain in feet when walking  [] Pain in feet at rest  [] Pain in feet when laying flat   [x] History of DVT   [] Phlebitis   [x] Swelling in legs   [] Varicose veins   [] Non-healing ulcers Pulmonary:   [] Uses home oxygen   [] Productive cough   [] Hemoptysis   [] Wheeze  [] COPD   [] Asthma Neurologic:  [] Dizziness  [] Blackouts   [] Seizures   [] History of stroke   [] History of TIA  [] Aphasia   [] Temporary blindness   [] Dysphagia   [] Weakness or numbness in arms   [] Weakness or numbness in legs Musculoskeletal:  [x] Arthritis   [x] Joint swelling   [x] Joint pain   [x] Low back pain Hematologic:  [] Easy bruising  [] Easy bleeding   [] Hypercoagulable state   [] Anemic  [] Hepatitis Gastrointestinal:  [] Blood in stool   [] Vomiting blood  [] Gastroesophageal reflux/heartburn   [] Difficulty swallowing. Genitourinary:  [] Chronic kidney disease   [] Difficult urination  [] Frequent urination  [] Burning with urination   [] Blood in urine Skin:  [] Rashes   [] Ulcers   [] Wounds Psychological:  [] History of anxiety   []  History of major depression.   Physical Examination  Vitals:   02/25/18 0817 02/25/18 1130 02/25/18 1649 02/25/18 1939  BP:  (!) 161/91 (!) 171/86 (!) 154/91  Pulse:  73 77 81  Resp:  (!) 27 18 18   Temp:   97.8 F (36.6 C) 98.3 F (36.8 C)  TempSrc:   Oral Oral  SpO2:  97% 98% 97%  Weight: 285 lb (129.3 kg)     Height: 6\' 2"  (1.88 m)      Body mass index is 36.59 kg/m.  Head: [place]/AT, No temporalis wasting. Prominent temp pulse not noted. Ear/Nose/Throat: Nares w/o erythema or drainage, oropharynx w/o obsrtuction, Mallampati score: 4.    Eyes: PERRLA, Sclera nonicteric.  Neck: Supple, no nuchal rigidity.  No bruit or JVD.  Pulmonary:  Breath sounds equal bilaterally, no use of accessory muscles.  Cardiac: RRR, normal S1, S2, no Murmurs, rubs or gallops. Vascular: Mild venous stasis changes bilateral lower  extremities particularly in the ankles.  Scattered small varicosities noted.  1-2+ edema.  2+ DP and PT pulses bilaterally. Gastrointestinal: soft, non-tender, non-distended.  Musculoskeletal: Moves all extremities.  No deformity or  atrophy. No edema. Neurologic: CN 2-12 intact. Symmetrical.  Speech is fluent.  Psychiatric: Judgment intact, Mood & affect appropriate for pt's clinical situation. Dermatologic: No rashes or ulcers noted.  No cellulitis or open wounds. Lymph : No Cervical,  or Inguinal lymphadenopathy.      CBC Lab Results  Component Value Date   WBC 7.8 02/25/2018   HGB 13.8 02/25/2018   HCT 40.8 02/25/2018   MCV 88.9 02/25/2018   PLT 131 (L) 02/25/2018    BMET    Component Value Date/Time   NA 142 02/25/2018 0845   K 4.1 02/25/2018 0845   CL 106 02/25/2018 0845   CO2 30 02/25/2018 0845   GLUCOSE 110 (H) 02/25/2018 0845   BUN 17 02/25/2018 0845   CREATININE 1.17 02/25/2018 0845   CALCIUM 8.8 (L) 02/25/2018 0845   GFRNONAA >60 02/25/2018 0845   GFRAA >60 02/25/2018 0845   Estimated Creatinine Clearance: 88.7 mL/min (by C-G formula based on SCr of 1.17 mg/dL).  COAG Lab Results  Component Value Date   INR 1.03 02/25/2018     Assessment/Plan 1. acute left pulmonary embolism associated with bilateral DVT age undetermined:  Currently the patient is on a heparin drip.  He is at bedrest for the first 24 hours and his feet are elevated.  At the time of my interview he is not on any oxygen whatsoever he is speaking in complete sentences and does not appear to be in any distress.  There is no evidence of right heart strain.  With respect to his lower extremities although there is venous stasis changes present bilaterally and mild edema his legs are quite equal clinically and neither is painful massively swollen and both have easily palpable pulses.  Given these findings there is no indication at this time for acute intervention.  I have asked that he ambulate  tomorrow after he has been on heparin for 24 hours and get a better assessment of any functional disability.  I have stressed that any intervention will not alleviate the pleuritic chest pain that this is something that will resolve with time.  The goal of intervention would be to improve oxygenation and cardiac function or symptomatic lower extremity disability.  I do not anticipate these problems will rise to a level that warrants intervention.  I would advocate for transition to oral anticoagulation tomorrow and for discharge to home with follow-up in the office.   2. Venous insufficiency chronic: No surgery or intervention at this point in time.    I have had a long discussion with the patient regarding venous insufficiency and why it  causes symptoms. I have discussed with the patient the chronic skin changes that accompany venous insufficiency and the long term sequela such as infection and ulceration.  Patient will begin wearing graduated compression stockings class 1 (20-30 mmHg) or compression wraps on a daily basis a prescription was given. The patient will put the stockings on first thing in the morning and removing them in the evening. The patient is instructed specifically not to sleep in the stockings.    In addition, behavioral modification including several periods of elevation of the lower extremities during the day will be continued. I have demonstrated that proper elevation is a position with the ankles at heart level.  The patient is instructed to begin routine exercise, especially walking on a daily basis  The patient will follow up to reassess the degree of swelling and the control that graduated compression stockings or compression wraps  is offering.   The patient can be assessed for a Lymph Pump at that time  3.  Hypertension: Continue antihypertensive medications as already ordered, these medications have been reviewed and there are no changes at this time.   3.  Arthritis: Consider changing his NSAID medication to Celebrex given his requirement for anticoagulation.  I defer this to the medical service  Continued activity and therapy was stressed.     Hortencia Pilar, MD  02/25/2018 9:56 PM

## 2018-02-25 NOTE — ED Triage Notes (Signed)
Pt c/o increased SOB over the past week and since last night began having left sided chest pain that radiates into the arm.pt is in NAD at present, skin is warm and dry,

## 2018-02-25 NOTE — Progress Notes (Signed)
PHARMACIST - PHYSICIAN ORDER COMMUNICATION  CONCERNING: P&T Medication Policy on Herbal Medications  DESCRIPTION:  This patient's order for:  Cannabadiol solution   has been noted.  This product(s) is classified as an "herbal" or natural product. Due to a lack of definitive safety studies or FDA approval, nonstandard manufacturing practices, plus the potential risk of unknown drug-drug interactions while on inpatient medications, the Pharmacy and Therapeutics Committee does not permit the use of "herbal" or natural products of this type within [facility].   ACTION TAKEN: The pharmacy department is unable to verify this order at this time and your patient has been informed of this safety policy. Please reevaluate patient's clinical condition at discharge and address if the herbal or natural product(s) should be resumed at that time.

## 2018-02-25 NOTE — Progress Notes (Signed)
Family Meeting Note  Advance Directive:no  Today a meeting took place with the pt and wife   The following were discussed:Patient's diagnosis: patient being admitted for unprovoked PE. Patient's progosis: stable additional follow-up to be provided: patient follow-up with hematology code status. Patient's full code  Time spent during discussion: 16 mins Fritzi Mandes, MD

## 2018-02-25 NOTE — H&P (Signed)
[facility] NAME: Russell Garrett    MR#:  [id number]  DATE OF BIRTH:  12-19-1950  DATE OF ADMISSION:  02/25/2018  PRIMARY CARE PHYSICIAN: Venia Carbon, MD   REQUESTING/REFERRING PHYSICIAN: Dr. Gwyndolyn Saxon  CHIEF COMPLAINT:  sharp pain on the chest and left shoulder this morning  HISTORY OF PRESENT ILLNESS:  Russell Garrett  is a 67 y.o. male with a known history of morbid obesity, right hip arthritis, hypertension, obstructive sleep apnea comes to the emergency room accompanied by his wife after he experienced sharp chest pain left side of pleuritic radiating to the left shoulder which shortness of breath that woke him up around 4:30 in the morning. Workup in the emergency room shows patient has left side of pulmonary emboli. He is hemodynamically stable. He started on IV heparin drip. Patient denies any recent surgery, travel, history of any cancer or any new medication. Part of life patient has been said sedentry lately secondary to his arthritis and cold weather outside not able to be active. Patient is being admitted for further evaluation of management. PAST MEDICAL HISTORY:   Past Medical History:  Diagnosis Date  . Arthritis    r hip  . 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 Diverticulitis    pt denies  . Hyperlipidemia   . Hypertension   . OSA (obstructive sleep apnea)   . Sleep apnea    CPAP    PAST SURGICAL HISTOIRY:   Past Surgical History:  Procedure Laterality Date  . BACK SURGERY    . COLONOSCOPY    . THORACENTESIS  08/2006   left pleural effusion    SOCIAL HISTORY:   Social History   Tobacco Use  . Smoking status: Former Smoker    Packs/day: 1.00    Years: 35.00    Pack years: 35.00    Types: Cigarettes    Last attempt to quit: 12/16/2015    Years since quitting: 2.1  . Smokeless tobacco: Never Used  Substance Use Topics  . Alcohol use: Yes    Alcohol/week: 0.0 oz   Comment: occ    FAMILY HISTORY:   Family History  Problem Relation Age of Onset  . Hypertension Mother   . Heart disease Mother   . Kidney disease Mother   . Heart disease Father   . Stroke Father   . Prostate cancer Father   . Kidney disease Father   . Heart disease Brother        cardioversion for arrhythmia  . Diabetes Neg Hx   . Colon cancer Neg Hx     DRUG ALLERGIES:  No Known Allergies  REVIEW OF SYSTEMS:  Review of Systems  Constitutional: Negative for chills, fever and weight loss.  HENT: Negative for ear discharge, ear pain and nosebleeds.   Eyes: Negative for blurred vision, pain and discharge.  Respiratory: Positive for shortness of breath. Negative for sputum production, wheezing and stridor.   Cardiovascular: Positive for chest pain and leg swelling. Negative for palpitations, orthopnea and PND.  Gastrointestinal: Negative for abdominal pain, diarrhea, nausea and vomiting.  Genitourinary: Negative for frequency and urgency.  Musculoskeletal: Negative for back pain and joint pain.  Neurological: Negative for sensory change, speech change, focal weakness and weakness.  Psychiatric/Behavioral: Negative for depression and hallucinations. The patient is not nervous/anxious.      MEDICATIONS AT HOME:   Prior to Admission medications   Medication  Sig Start Date End Date Taking? Authorizing Provider  aspirin 81 MG tablet Take 81 mg by mouth daily.     Yes [provider]  atorvastatin (LIPITOR) 40 MG tablet Take 1 tablet (40 mg total) by mouth daily. 03/17/17  Yes Venia Carbon, MD  Cannabidiol 100 MG/ML SOLN Take 50 mg by mouth daily.   Yes [provider]  losartan-hydrochlorothiazide (HYZAAR) 50-12.5 MG tablet Take 1 tablet by mouth daily. 03/17/17  Yes Venia Carbon, MD  naproxen sodium (ANAPROX) 220 MG tablet Take 220 mg by mouth 2 (two) times daily with a meal.   Yes [provider]  lisinopril-hydrochlorothiazide  (PRINZIDE,ZESTORETIC) 10-12.5 MG per tablet Take 1 tablet by mouth daily. 03/26/11 02/16/12  Venia Carbon, MD      VITAL SIGNS:  Height 6\' 2"  (1.88 m), weight 129.3 kg (285 lb).  PHYSICAL EXAMINATION:  GENERAL:  67 y.o.-year-old patient lying in the bed with no acute distress. Obese EYES: Pupils equal, round, reactive to light and accommodation. No scleral icterus. Extraocular muscles intact.  HEENT: Head atraumatic, normocephalic. Oropharynx and nasopharynx clear.  NECK:  Supple, no jugular venous distention. No thyroid enlargement, no tenderness.  LUNGS: Normal breath sounds bilaterally, no wheezing, rales,rhonchi or crepitation. No use of accessory muscles of respiration.  CARDIOVASCULAR: S1, S2 normal. No murmurs, rubs, or gallops.  ABDOMEN: Soft, nontender, nondistended. Bowel sounds present. No organomegaly or mass.  EXTREMITIES: No pedal edema, cyanosis, or clubbing.  NEUROLOGIC: Cranial nerves II through XII are intact. Muscle strength 5/5 in all extremities. Sensation intact. Gait not checked.  PSYCHIATRIC: The patient is alert and oriented x 3.  SKIN: No obvious rash, lesion, or ulcer.   LABORATORY PANEL:   CBC Recent Labs  Lab 02/25/18 0845  WBC 7.8  HGB 13.8  HCT 40.8  PLT 131*   ------------------------------------------------------------------------------------------------------------------  Chemistries  Recent Labs  Lab 02/25/18 0845  NA 142  K 4.1  CL 106  CO2 30  GLUCOSE 110*  BUN 17  CREATININE 1.17  CALCIUM 8.8*   ------------------------------------------------------------------------------------------------------------------  Cardiac Enzymes Recent Labs  Lab 02/25/18 0845  TROPONINI <0.03   ------------------------------------------------------------------------------------------------------------------  RADIOLOGY:  Dg Chest 2 View  Result Date: 02/25/2018 CLINICAL DATA:  Shortness of breath and chest pain. EXAM: CHEST - 2 VIEW  COMPARISON:  07/19/2015 FINDINGS: Normal heart size. Bilateral pleural effusions are identified. Asymmetric elevation of right hemidiaphragm. Pulmonary vascular congestion noted. IMPRESSION: 1. Mild CHF. Electronically Signed   By: Kerby Moors M.D.   On: 02/25/2018 09:19   Ct Angio Chest Pe W And/or Wo Contrast  Result Date: 02/25/2018 CLINICAL DATA:  Worsening shortness of breath during the past week, now with left-sided chest pain radiating to the arm. Evaluate for pulmonary embolism. EXAM: CT ANGIOGRAPHY CHEST WITH CONTRAST TECHNIQUE: Multidetector CT imaging of the chest was performed using the standard protocol during bolus administration of intravenous contrast. Multiplanar CT image reconstructions and MIPs were obtained to evaluate the vascular anatomy. CONTRAST:  46mL OMNIPAQUE IOHEXOL 350 MG/ML SOLN COMPARISON:  Chest CT - 11/25/2006 FINDINGS: Vascular Findings: There is adequate opacification of the pulmonary arterial system with the main pulmonary artery measuring 229 Hounsfield units. There is nonocclusive embolism seen within the left lower lobe pulmonary artery (image 43, series 4; coronal image 75, series 7), extending to involve left lower lobe segmental branches as well supplying the lingula (image 47, series 4). No definite evidence of right-sided pulmonary embolism. Overall clot burden is deemed small in volume. Normal right  to left ventricular ratio without CT evidence of right-sided heart strain. Additionally, there is no reflux of contrast into the intrahepatic venous system. Borderline enlarged caliber of the main pulmonary artery measuring 3.3 cm in diameter, similar to the 11/2006 examination. Normal heart size.  No pericardial effusion. Scattered atherosclerotic plaque within the abdominal aorta. Mild aneurysmal dilatation of the ascending thoracic aorta measuring 4.8 cm in diameter (image 38, series 4), grossly unchanged compared to the 11/2006 examination. The thoracic aorta tapers  to a normal caliber at the level of the aortic arch. Bovine configuration of the aortic arch. The branch vessels of the aortic arch appear patent throughout their course. Review of the MIP images confirms the above findings. ---------------------------------------------------------------------------------- Nonvascular Findings: Mediastinum/Lymph Nodes: Scattered mediastinal lymph nodes are numerous though individually not enlarged by size criteria with index right-sided paratracheal and AP window lymph nodes measuring 0.9 cm in greatest short axis diameter (images 25 and 31, series 4). No bulky mediastinal, hilar axillary lymphadenopathy. Lungs/Pleura: Contrast opacities within the inferior segment of the lingula may represent atelectasis versus pulmonary infarction. Minimal subsegmental atelectasis within the right lower lobe. Trace left-sided pleural effusion. No pneumothorax. The central pulmonary airways appear widely patent. No discrete pulmonary nodules. Upper abdomen: Limited evaluation of the upper abdomen demonstrates 2 adjacent exophytic cyst arising from the superior pole of the right kidney with dominant cyst measuring approximately 3.3 cm (image 95, series 4). Note is made of a small splenule. Small hiatal hernia. Musculoskeletal: Regional soft tissues appear normal. Normal appearance of the thyroid gland. No acute or aggressive osseous abnormalities. Stigmata of DISH within the thoracic spine. IMPRESSION: 1. Examination is positive for small burden left-sided pulmonary embolism as detailed above. No CT evidence of right-sided heart strain. 2. Ground-glass opacities within the lingula may represent atelectasis though of all vein pulmonary infarct could have a similar appearance. 3. Trace left-sided pleural effusion, likely reactive. 4.  Aortic Atherosclerosis (ICD10-I70.0). 5. 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 Aortic aneurysm NOS (ICD10-I71.9). Recommend semi-annual imaging followup by CTA or MRA and referral to cardiothoracic surgery if not already obtained. This recommendation follows 2010 ACCF/AHA/AATS/ACR/ASA/SCA/SCAI/SIR/STS/SVM Guidelines for the Diagnosis and Management of Patients With Thoracic Aortic Disease. Circulation. 2010; 121: E527-P824 Critical Value/emergent results were called by telephone at the time of interpretation on 02/25/2018 at 11:10 am to Dr. Lenise Arena , who verbally acknowledged these results. Electronically Signed   By: Sandi Mariscal M.D.   On: 02/25/2018 11:19    EKG:    IMPRESSION AND PLAN:     Russell Garrett  is a 67 y.o. male with a known history of morbid obesity, right hip arthritis, hypertension, obstructive sleep apnea comes to the emergency room accompanied by his wife after he experienced sharp chest pain left side of pleuritic radiating to the left shoulder which shortness of breath that woke him up around 4:30 in the morning. Workup in the emergency room shows patient has left side of pulmonary emboli.  1. acute left pulmonary embolism appears unprovoked. So far etiology not available. -Mid to medical floor -IV heparin drip. Patient remains hemodynamically stable transition to oral anticoagulation -echo of the heart -patient will benefit from outpatient oncology follow-up for unprovoked PE -Doppler bilateral lower extremity  2. Hypertension resume home meds  3. Arthritis PRN pain meds  4. DVT prophylaxis already on IV heparin   Above was discussed with patient's wife. All the records are reviewed  and case discussed with ED provider. Management plans discussed with the patient, family and they are in agreement.  CODE STATUS: full  TOTAL TIME TAKING CARE OF THIS PATIENT: 50 minutes.    Fritzi Mandes M.D on 02/25/2018 at 12:50 PM  Between 7am to 6pm - Pager - [phone]  After 6pm go to [email or web address] - password [email or web address] [facility]  [employer]  Hospitalists  Office  [phone]  CC: Primary care physician; Venia Carbon, MD

## 2018-02-25 NOTE — ED Notes (Signed)
Patient transported to CT 

## 2018-02-25 NOTE — ED Notes (Signed)
pt cleared by ED MD to eat. Pt family supplied pt meal

## 2018-02-25 NOTE — ED Provider Notes (Signed)
[facility] Emergency Department Provider Note       Time seen: ----------------------------------------- 8:34 AM on 02/25/2018 -----------------------------------------   I have reviewed the triage vital signs and the nursing notes.  HISTORY   Chief Complaint Chest Pain    HPI Russell Garrett is a 67 y.o. male with a history of diverticular's, hyperlipidemia, hypertension, sleep apnea who presents to the ED for shortness of breath that is been persistent over the last week that is worse with any exertion.  This morning he awoke around 4 AM with sharp and stabbing left-sided chest pain is worse whenever he takes of breath.  He has never had this before, nothing makes it go away entirely.  He denies any recent illness or other complaints.  Patient states he has chronic peripheral edema.  Past Medical History:  Diagnosis Date  . Arthritis    r hip  . 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 Diverticulitis    pt denies  . Hyperlipidemia   . Hypertension   . OSA (obstructive sleep apnea)   . Sleep apnea    CPAP    Patient Active Problem List   Diagnosis Date Noted  . Right hip OA, Advanced 03/24/2017  . Welcome to Medicare preventive visit 03/17/2017  . Right knee pain 03/17/2017  . Fatigue 04/25/2016  . Routine general medical examination at a health care facility 02/16/2012  . OBSTRUCTIVE SLEEP APNEA 08/01/2008  . Hyperlipemia 03/09/2007  . Essential hypertension, benign 03/09/2007  . DIVERTICULOSIS, COLON 03/09/2007    Past Surgical History:  Procedure Laterality Date  . BACK SURGERY    . COLONOSCOPY    . THORACENTESIS  08/2006   left pleural effusion    Allergies Patient has no known allergies.  Social History Social History   Tobacco Use  . Smoking status: Former Smoker    Packs/day: 1.00    Years: 35.00    Pack years: 35.00    Types: Cigarettes    Last attempt to quit: 12/16/2015    Years since quitting:  2.1  . Smokeless tobacco: Never Used  Substance Use Topics  . Alcohol use: Yes    Alcohol/week: 0.0 oz    Comment: occ  . Drug use: No   Review of Systems Constitutional: Negative for fever. Cardiovascular: Positive for chest pain Respiratory: Positive for shortness of breath Gastrointestinal: Negative for abdominal pain, vomiting and diarrhea. Musculoskeletal: Positive for chronic leg swelling Skin: Negative for rash. Neurological: Negative for headaches, focal weakness or numbness.  All systems negative/normal/unremarkable except as stated in the HPI  ____________________________________________   PHYSICAL EXAM:  VITAL SIGNS: ED Triage Vitals [02/25/18 0817]  Enc Vitals Group     BP      Pulse      Resp      Temp      Temp src      SpO2      Weight 285 lb (129.3 kg)     Height 6\' 2"  (1.88 m)     Head Circumference      Peak Flow      Pain Score 5     Pain Loc      Pain Edu?      Excl. in [place]?    Constitutional: Alert and oriented. Well appearing and in no distress. Eyes: Conjunctivae are normal. Normal extraocular movements. ENT   Head: Normocephalic and atraumatic.   Nose: No congestion/rhinnorhea.   Mouth/Throat: Mucous membranes  are moist.   Neck: No stridor. Cardiovascular: Normal rate, regular rhythm. No murmurs, rubs, or gallops. Respiratory: Normal respiratory effort without tachypnea nor retractions. Breath sounds are clear and equal bilaterally. No wheezes/rales/rhonchi. Gastrointestinal: Soft and nontender. Normal bowel sounds Musculoskeletal: Nontender with normal range of motion in extremities. No lower extremity tenderness nor edema. Neurologic:  Normal speech and language. No gross focal neurologic deficits are appreciated.  Skin:  Skin is warm, dry and intact. No rash noted. Psychiatric: Mood and affect are normal. Speech and behavior are normal.  ____________________________________________  EKG: Interpreted by me.  Sinus rhythm  the rate is 79 bpm, normal PR interval, wide QRS, normal QT, normal axis.  ____________________________________________  ED COURSE:  As part of my medical decision making, I reviewed the following data within the [facility] History obtained from family if available, nursing notes, old chart and ekg, as well as notes from prior ED visits. Patient presented for nonspecific chest pain, we will assess with labs and imaging as indicated at this time.   Procedures ____________________________________________   LABS (pertinent positives/negatives)  Labs Reviewed  BASIC METABOLIC PANEL - Abnormal; Notable for the following components:      Result Value   Glucose, Bld 110 (*)    Calcium 8.8 (*)    All other components within normal limits  CBC - Abnormal; Notable for the following components:   Platelets 131 (*)    All other components within normal limits  FIBRIN DERIVATIVES D-DIMER ([facility] ONLY) - Abnormal; Notable for the following components:   Fibrin derivatives D-dimer ([facility]) >7,500.00 (*)    All other components within normal limits  TROPONIN I  BRAIN NATRIURETIC PEPTIDE  PROTIME-INR  APTT   CRITICAL CARE Performed by: Laurence Aly   Total critical care time: 30 minutes  Critical care time was exclusive of separately billable procedures and treating other patients.  Critical care was necessary to treat or prevent imminent or life-threatening deterioration.  Critical care was time spent personally by me on the following activities: development of treatment plan with patient and/or surrogate as well as nursing, discussions with consultants, evaluation of patient's response to treatment, examination of patient, obtaining history from patient or surrogate, ordering and performing treatments and interventions, ordering and review of laboratory studies, ordering and review of radiographic studies, pulse oximetry and re-evaluation of patient's  condition.  RADIOLOGY Images were viewed by me  Chest x-ray IMPRESSION: 1. Mild CHF. CT angiogram of the chest reveals a left-sided pulmonary embolus ____________________________________________  DIFFERENTIAL DIAGNOSIS   Musculoskeletal pain, MI, PE, CHF, pneumothorax  FINAL ASSESSMENT AND PLAN  Chest pain, dyspnea, pulmonary embolus   Plan: The patient had presented for a week of dyspnea with sharp left-sided chest pain that is pleuritic in nature. Patient's labs were negative with the exception of markedly elevated d-dimer. Patient's imaging on chest x-ray was concerning for mild CHF although his BNP is normal.  D-dimer was markedly elevated therefore CT angiogram was obtained which did reveal a significant left-sided pulmonary embolus.  We will place him on a heparin drip.  It is unclear at this time why he has developed a pulmonary embolus as he does not appear to be high risk.  He is stable for admission at this time.   Laurence Aly, MD   Note: This note was generated in part or whole with voice recognition software. Voice recognition is usually quite accurate but there are transcription errors that can and very often do occur.  I apologize for any typographical errors that were not detected and corrected.     Earleen Newport, MD 02/25/18 [phone]

## 2018-02-25 NOTE — Progress Notes (Signed)
[place] for heparin drip  Indication: pulmonary embolus  No Known Allergies  Patient Measurements: Height: 6\' 2"  (188 cm) Weight: 285 lb (129.3 kg) IBW/kg (Calculated) : 82.2 Heparin Dosing Weight: 110.7 kg   Vital Signs:    Labs: Recent Labs    02/25/18 0845  HGB 13.8  HCT 40.8  PLT 131*  CREATININE 1.17  TROPONINI <0.03    Estimated Creatinine Clearance: 88.7 mL/min (by C-G formula based on SCr of 1.17 mg/dL).   Medical History: Past Medical History:  Diagnosis Date  . Arthritis    r hip  . 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 Diverticulitis    pt denies  . Hyperlipidemia   . Hypertension   . OSA (obstructive sleep apnea)   . Sleep apnea    CPAP    Assessment: 67 year old male present to ED with pulmonary embolism. Baseline CBC ordered. Based on current medication reconciliation, patient was not on anticoagulant prior to admission.   Goal of Therapy:  Heparin level 0.3-0.7 units/ml Monitor platelets by anticoagulation protocol: Yes   Plan:  Will start with a heparin bolus 5000 units followed by heparin drip at 1900 units/hr. Will check first HL in 6 hours. Will continue to monitor daily CBC's.   Pharmacy to continue to monitor and follow per protocol.   Candelaria Stagers, PharmD Pharmacy Resident  02/25/2018,11:21 AM

## 2018-02-26 ENCOUNTER — Inpatient Hospital Stay (HOSPITAL_COMMUNITY)
Admit: 2018-02-26 | Discharge: 2018-02-26 | Disposition: A | Discharge status: Home / Self Care | Payer: PPO | Attending: Internal Medicine | Admitting: Internal Medicine

## 2018-02-26 DIAGNOSIS — R9431 Abnormal electrocardiogram [ECG] [EKG]: Secondary | ICD-10-CM

## 2018-02-26 LAB — HEPARIN LEVEL (UNFRACTIONATED)
HEPARIN UNFRACTIONATED: 0.74 [IU]/mL — AB (ref 0.30–0.70)
HEPARIN UNFRACTIONATED: 0.46 [IU]/mL (ref 0.30–0.70)
Heparin Unfractionated: 0.57 IU/mL (ref 0.30–0.70)

## 2018-02-26 LAB — ECHOCARDIOGRAM COMPLETE
HEIGHTINCHES: 74 in
Weight: 4560 oz

## 2018-02-26 LAB — CBC
HCT: 40.7 % (ref 40.0–52.0)
Hemoglobin: 13.9 g/dL (ref 13.0–18.0)
MCH: 29.8 pg (ref 26.0–34.0)
MCHC: 34.1 g/dL (ref 32.0–36.0)
MCV: 87.4 fL (ref 80.0–100.0)
PLATELETS: 159 10*3/uL (ref 150–440)
RBC: 4.66 MIL/uL (ref 4.40–5.90)
RDW: 13.2 % (ref 11.5–14.5)
WBC: 8.7 10*3/uL (ref 3.8–10.6)

## 2018-02-26 MED ORDER — LOSARTAN POTASSIUM 50 MG PO TABS
50.0000 mg | ORAL_TABLET | Freq: Every day | ORAL | Status: DC
  Administered 2018-02-27: 50 mg via ORAL
  Filled 2018-02-26: qty 1

## 2018-02-26 MED ORDER — HYDROCHLOROTHIAZIDE 25 MG PO TABS
25.0000 mg | ORAL_TABLET | Freq: Every day | ORAL | Status: DC
  Administered 2018-02-27: 25 mg via ORAL
  Filled 2018-02-26: qty 1

## 2018-02-26 NOTE — Progress Notes (Signed)
Patient ID: Russell Garrett, male   DOB: [birth year], 67 y.o.   MRN: [id number]  [facility] PROGRESS NOTE  Russell Garrett LPF:[id number] DOB: 06-24-1951 DOA: 02/25/2018 PCP: Venia Carbon, MD  HPI/Subjective: Patient still having some left-sided pleuritic chest pain.  Feels more comfortable if I left the IV blood thinner today.  Always has some swelling in his legs.  No pain in his legs.  Objective: Vitals:   02/26/18 0326 02/26/18 0900  BP: (!) 165/91 (!) 164/88  Pulse: 72 72  Resp: 18 20  Temp: 97.8 F (36.6 C) 97.9 F (36.6 C)  SpO2: 97% 97%    Filed Weights   02/25/18 0817 02/26/18 1423  Weight: 129.3 kg (285 lb) 130.6 kg (287 lb 14.4 oz)    ROS: Review of Systems  Constitutional: Negative for chills and fever.  Eyes: Negative for blurred vision.  Respiratory: Positive for shortness of breath. Negative for cough.   Cardiovascular: Positive for chest pain.  Gastrointestinal: Negative for abdominal pain, constipation, diarrhea, nausea and vomiting.  Genitourinary: Negative for dysuria.  Musculoskeletal: Negative for joint pain.  Neurological: Negative for dizziness and headaches.   Exam: Physical Exam  Constitutional: He is oriented to person, place, and time.  HENT:  Nose: No mucosal edema.  Mouth/Throat: No oropharyngeal exudate or posterior oropharyngeal edema.  Eyes: Pupils are equal, round, and reactive to light. Conjunctivae, EOM and lids are normal.  Neck: No JVD present. Carotid bruit is not present. No edema present. No thyroid mass and no thyromegaly present.  Cardiovascular: S1 normal and S2 normal. Exam reveals no gallop.  No murmur heard. Pulses:      Dorsalis pedis pulses are 2+ on the right side, and 2+ on the left side.  Respiratory: No respiratory distress. He has no wheezes. He has no rhonchi. He has no rales.  GI: Soft. Bowel sounds are normal. There is no tenderness.  Musculoskeletal:       Right ankle: He exhibits swelling.       Left ankle:  He exhibits swelling.  Lymphadenopathy:    He has no cervical adenopathy.  Neurological: He is alert and oriented to person, place, and time. No cranial nerve deficit.  Skin: Skin is warm. No rash noted. Nails show no clubbing.  Psychiatric: He has a normal mood and affect.      Data Reviewed: Basic Metabolic Panel: Recent Labs  Lab 02/25/18 0845  NA 142  K 4.1  CL 106  CO2 30  GLUCOSE 110*  BUN 17  CREATININE 1.17  CALCIUM 8.8*   CBC: Recent Labs  Lab 02/25/18 0845  WBC 7.8  HGB 13.8  HCT 40.8  MCV 88.9  PLT 131*   Cardiac Enzymes: Recent Labs  Lab 02/25/18 0845  TROPONINI <0.03   BNP (last 3 results) Recent Labs    02/25/18 0846  BNP 33.0     Studies: Dg Chest 2 View  Result Date: 02/25/2018 CLINICAL DATA:  Shortness of breath and chest pain. EXAM: CHEST - 2 VIEW COMPARISON:  07/19/2015 FINDINGS: Normal heart size. Bilateral pleural effusions are identified. Asymmetric elevation of right hemidiaphragm. Pulmonary vascular congestion noted. IMPRESSION: 1. Mild CHF. Electronically Signed   By: Kerby Moors M.D.   On: 02/25/2018 09:19   Ct Angio Chest Pe W And/or Wo Contrast  Result Date: 02/25/2018 CLINICAL DATA:  Worsening shortness of breath during the past week, now with left-sided chest pain radiating to the arm. Evaluate for pulmonary embolism. EXAM: CT ANGIOGRAPHY  CHEST WITH CONTRAST TECHNIQUE: Multidetector CT imaging of the chest was performed using the standard protocol during bolus administration of intravenous contrast. Multiplanar CT image reconstructions and MIPs were obtained to evaluate the vascular anatomy. CONTRAST:  23mL OMNIPAQUE IOHEXOL 350 MG/ML SOLN COMPARISON:  Chest CT - 11/25/2006 FINDINGS: Vascular Findings: There is adequate opacification of the pulmonary arterial system with the main pulmonary artery measuring 229 Hounsfield units. There is nonocclusive embolism seen within the left lower lobe pulmonary artery (image 43, series 4;  coronal image 75, series 7), extending to involve left lower lobe segmental branches as well supplying the lingula (image 47, series 4). No definite evidence of right-sided pulmonary embolism. Overall clot burden is deemed small in volume. Normal right to left ventricular ratio without CT evidence of right-sided heart strain. Additionally, there is no reflux of contrast into the intrahepatic venous system. Borderline enlarged caliber of the main pulmonary artery measuring 3.3 cm in diameter, similar to the 11/2006 examination. Normal heart size.  No pericardial effusion. Scattered atherosclerotic plaque within the abdominal aorta. Mild aneurysmal dilatation of the ascending thoracic aorta measuring 4.8 cm in diameter (image 38, series 4), grossly unchanged compared to the 11/2006 examination. The thoracic aorta tapers to a normal caliber at the level of the aortic arch. Bovine configuration of the aortic arch. The branch vessels of the aortic arch appear patent throughout their course. Review of the MIP images confirms the above findings. ---------------------------------------------------------------------------------- Nonvascular Findings: Mediastinum/Lymph Nodes: Scattered mediastinal lymph nodes are numerous though individually not enlarged by size criteria with index right-sided paratracheal and AP window lymph nodes measuring 0.9 cm in greatest short axis diameter (images 25 and 31, series 4). No bulky mediastinal, hilar axillary lymphadenopathy. Lungs/Pleura: Contrast opacities within the inferior segment of the lingula may represent atelectasis versus pulmonary infarction. Minimal subsegmental atelectasis within the right lower lobe. Trace left-sided pleural effusion. No pneumothorax. The central pulmonary airways appear widely patent. No discrete pulmonary nodules. Upper abdomen: Limited evaluation of the upper abdomen demonstrates 2 adjacent exophytic cyst arising from the superior pole of the right kidney  with dominant cyst measuring approximately 3.3 cm (image 95, series 4). Note is made of a small splenule. Small hiatal hernia. Musculoskeletal: Regional soft tissues appear normal. Normal appearance of the thyroid gland. No acute or aggressive osseous abnormalities. Stigmata of DISH within the thoracic spine. IMPRESSION: 1. Examination is positive for small burden left-sided pulmonary embolism as detailed above. No CT evidence of right-sided heart strain. 2. Ground-glass opacities within the lingula may represent atelectasis though of all vein pulmonary infarct could have a similar appearance. 3. Trace left-sided pleural effusion, likely reactive. 4.  Aortic Atherosclerosis (ICD10-I70.0). 5. 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 Aortic aneurysm NOS (ICD10-I71.9). Recommend semi-annual imaging followup by CTA or MRA and referral to cardiothoracic surgery if not already obtained. This recommendation follows 2010 ACCF/AHA/AATS/ACR/ASA/SCA/SCAI/SIR/STS/SVM Guidelines for the Diagnosis and Management of Patients With Thoracic Aortic Disease. Circulation. 2010; 121: Z610-R604 Critical Value/emergent results were called by telephone at the time of interpretation on 02/25/2018 at 11:10 am to Dr. Lenise Arena , who verbally acknowledged these results. Electronically Signed   By: Sandi Mariscal M.D.   On: 02/25/2018 11:19   US Venous Img Lower Bilateral  Result Date: 02/25/2018 CLINICAL DATA:  History of pulmonary embolism now with chronic bilateral lower extremity swelling. Evaluate for DVT. EXAM: BILATERAL LOWER EXTREMITY VENOUS DOPPLER ULTRASOUND TECHNIQUE: Gray-scale sonography with graded  compression, as well as color Doppler and duplex ultrasound were performed to evaluate the lower extremity deep venous systems from the level of the common femoral vein and including the common femoral, femoral, profunda femoral, popliteal  and calf veins including the posterior tibial, peroneal and gastrocnemius veins when visible. The superficial great saphenous vein was also interrogated. Spectral Doppler was utilized to evaluate flow at rest and with distal augmentation maneuvers in the common femoral, femoral and popliteal veins. COMPARISON:  None. FINDINGS: RIGHT LOWER EXTREMITY Common Femoral Vein: No evidence of thrombus. Normal compressibility, respiratory phasicity and response to augmentation. Saphenofemoral Junction: No evidence of thrombus. Normal compressibility and flow on color Doppler imaging. Profunda Femoral Vein: No evidence of thrombus. Normal compressibility and flow on color Doppler imaging. Femoral Vein: No evidence of thrombus. Normal compressibility, respiratory phasicity and response to augmentation. Popliteal Vein: There is mixed echogenic nonocclusive wall thickening/DVT within the right popliteal vein (images 22 through 28). Calf Veins: No evidence of thrombus. Normal compressibility and flow on color Doppler imaging. Superficial Great Saphenous Vein: No evidence of thrombus. Normal compressibility. Other Findings:  None. LEFT LOWER EXTREMITY Common Femoral Vein: There is hypoechoic expansile near occlusive thrombus within the left common femoral vein (images 34 and 35. The saphenofemoral junction as well as the deep femoral vein both appear patent. There is hypoechoic near occlusive thrombus seen throughout the atretic left femoral vein (representative images 46 through 53). There is mixed echogenic near occlusive thrombus within the left popliteal vein (images 54 through 60). The left calf veins are not well imaged. Other Findings:  None. IMPRESSION: 1. Examination is positive for age-indeterminate near occlusive DVT extending from the left common femoral through the left femoral and popliteal veins. While some of the thrombus appears mixed echogenic and thus potentially chronic, in the absence of prior examinations, an  acute on chronic process is not excluded. Clinical correlation is advised. 2. 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 Electronically Signed   By: Sandi Mariscal M.D.   On: 02/25/2018 15:52    Scheduled Meds: . aspirin EC  81 mg Oral Daily  . atorvastatin  40 mg Oral Daily  . docusate sodium  100 mg Oral BID  . [START ON 02/27/2018] hydrochlorothiazide  25 mg Oral Daily   And  . [START ON 02/27/2018] losartan  50 mg Oral Daily   Continuous Infusions: . heparin 1,800 Units/hr (02/26/18 1427)    Assessment/Plan:   1. Acute pulmonary emboli, extensive bilateral lower extremity DVT, pleuritic chest pain.  Continue IV heparin today.  Hopefully will be Garrett to convert over to Eliquis upon discharge home.  Likely will need longer term anticoagulation secondary to the appearance of chronic lower extremity DVT.  I would definitely repeat an ultrasound of the lower extremities before even considering stopping anticoagulation. 2. Essential hypertension.  Increase hydrochlorothiazide to 25 mg daily 3. Hyperlipidemia unspecified on atorvastatin. 4. Obesity.  Weight loss needed   Code Status:     Code Status Orders  (From admission, onward)        Start     Ordered   02/25/18 1640  Full code  Continuous     02/25/18 1639    Code Status History    This patient has a current code status but no historical code status.     Family Communication: Spoke with wife at the bedside Disposition Plan: Hopefully home soon after switching over to [employer]  Consultants:  Vascular surgery  Time  spent: 35 minutes  [place]

## 2018-02-26 NOTE — Progress Notes (Signed)
Nutrition Education Note  RD consulted for nutrition education regarding a Heart Healthy diet.   Lipid Panel     Component Value Date/Time   CHOL 176 03/17/2017 1036   TRIG 69.0 03/17/2017 1036   HDL 47.80 03/17/2017 1036   CHOLHDL 4 03/17/2017 1036   VLDL 13.8 03/17/2017 1036   LDLCALC 115 (H) 03/17/2017 1036    RD provided "Heart Healthy Nutrition Therapy" handout from the Academy of [employer] Dietetics. Reviewed patient's dietary recall. Provided examples on ways to decrease sodium and fat intake in diet. Discouraged intake of processed foods and use of salt shaker. Encouraged fresh fruits and vegetables as well as whole grain sources of carbohydrates to maximize fiber intake. Teach back method used.  Expect good compliance.  Body mass index is 36.96 kg/m. Pt meets criteria for obesity based on current BMI.  Current diet order is HH, patient is consuming approximately 100% of meals at this time. Labs and medications reviewed. No further nutrition interventions warranted at this time. RD contact information provided. If additional nutrition issues arise, please re-consult RD.  Koleen Distance MS, RD, LDN Pager #- [phone] After Hours Pager: [phone]

## 2018-02-26 NOTE — Progress Notes (Addendum)
[place] for heparin drip  Indication: pulmonary embolus  No Known Allergies  Patient Measurements: Height: 6\' 2"  (188 cm) Weight: 287 lb 14.4 oz (130.6 kg) IBW/kg (Calculated) : 82.2 Heparin Dosing Weight: 110.7 kg   Vital Signs: Temp: 97.9 F (36.6 C) (04/05 0900) Temp Source: Oral (04/05 0900) BP: 164/88 (04/05 0900) Pulse Rate: 72 (04/05 0900)  Labs: Recent Labs    02/25/18 0845 02/25/18 1840 02/26/18 0824 02/26/18 1728  HGB 13.8  --   --  13.9  HCT 40.8  --   --  40.7  PLT 131*  --   --  159  APTT 28  --   --   --   LABPROT 13.4  --   --   --   INR 1.03  --   --   --   HEPARINUNFRC  --  0.66 0.74* 0.57  CREATININE 1.17  --   --   --   TROPONINI <0.03  --   --   --     Estimated Creatinine Clearance: 89.2 mL/min (by C-G formula based on SCr of 1.17 mg/dL).   Medical History: Past Medical History:  Diagnosis Date  . Arthritis    r hip  . 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 Diverticulitis    pt denies  . Hyperlipidemia   . Hypertension   . OSA (obstructive sleep apnea)   . Sleep apnea    CPAP    Assessment: 67 year old male present to ED with pulmonary embolism. Baseline CBC ordered. Based on current medication reconciliation, patient was not on anticoagulant prior to admission.   Goal of Therapy:  Heparin level 0.3-0.7 units/ml Monitor platelets by anticoagulation protocol: Yes   Plan:  HL = 0.57, therapeutic. Will continue at current rate of 1800 units/hr. Will obtain confirmatory heparin level in 6 hours.   Pharmacy to continue to monitor and follow per protocol.    Lendon Ka, PharmD Pharmacy Resident 02/26/2018,7:31 PM    4/5 PM heparin level 0.46. Continue current regimen. Recheck heparin level and CBC with tomorrow AM labs.  Sim Boast, PharmD, BCPS  02/26/18 11:53 PM

## 2018-02-26 NOTE — Progress Notes (Signed)
*  PRELIMINARY RESULTS* Echocardiogram 2D Echocardiogram has been performed.  Russell Garrett Russell Garrett Russell Garrett 02/26/2018, 11:17 AM

## 2018-02-26 NOTE — Care Management (Signed)
RNCM spoke with patient and wife regarding home health consult.  Patient states he "is never sick and independent at home". Wife states that Provider has shared that he will discharge on Eliquis. Voucher has been provided.

## 2018-02-26 NOTE — Progress Notes (Signed)
[place] for heparin drip  Indication: pulmonary embolus  No Known Allergies  Patient Measurements: Height: 6\' 2"  (188 cm) Weight: 285 lb (129.3 kg) IBW/kg (Calculated) : 82.2 Heparin Dosing Weight: 110.7 kg   Vital Signs: Temp: 97.8 F (36.6 C) (04/05 0326) Temp Source: Oral (04/05 0326) BP: 165/91 (04/05 0326) Pulse Rate: 72 (04/05 0326)  Labs: Recent Labs    02/25/18 0845 02/25/18 1840  HGB 13.8  --   HCT 40.8  --   PLT 131*  --   APTT 28  --   LABPROT 13.4  --   INR 1.03  --   HEPARINUNFRC  --  0.66  CREATININE 1.17  --   TROPONINI <0.03  --     Estimated Creatinine Clearance: 88.7 mL/min (by C-G formula based on SCr of 1.17 mg/dL).   Medical History: Past Medical History:  Diagnosis Date  . Arthritis    r hip  . 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 Diverticulitis    pt denies  . Hyperlipidemia   . Hypertension   . OSA (obstructive sleep apnea)   . Sleep apnea    CPAP    Assessment: 67 year old male present to ED with pulmonary embolism. Baseline CBC ordered. Based on current medication reconciliation, patient was not on anticoagulant prior to admission.   Goal of Therapy:  Heparin level 0.3-0.7 units/ml Monitor platelets by anticoagulation protocol: Yes   Plan:  Heparin level slightly high at 0.74. I will decrease rate to 1800 units/hr and recheck in 6 hours  Pharmacy to continue to monitor and follow per protocol.   Ramond Dial, Pharm.D, BCPS Clinical Pharmacist  02/26/2018,7:17 AM

## 2018-02-27 LAB — CBC
HCT: 40.9 % (ref 40.0–52.0)
HEMOGLOBIN: 13.8 g/dL (ref 13.0–18.0)
MCH: 29.7 pg (ref 26.0–34.0)
MCHC: 33.7 g/dL (ref 32.0–36.0)
MCV: 88.2 fL (ref 80.0–100.0)
PLATELETS: 156 10*3/uL (ref 150–440)
RBC: 4.64 MIL/uL (ref 4.40–5.90)
RDW: 13.7 % (ref 11.5–14.5)
WBC: 6.8 10*3/uL (ref 3.8–10.6)

## 2018-02-27 LAB — HEPARIN LEVEL (UNFRACTIONATED): Heparin Unfractionated: 0.36 IU/mL (ref 0.30–0.70)

## 2018-02-27 MED ORDER — APIXABAN 5 MG PO TABS: 5.0000 mg | ORAL_TABLET | Freq: Two times a day (BID) | ORAL | Status: DC

## 2018-02-27 MED ORDER — APIXABAN 5 MG PO TABS
10.0000 mg | ORAL_TABLET | Freq: Two times a day (BID) | ORAL | Status: DC
  Administered 2018-02-27: 10 mg via ORAL
  Filled 2018-02-27: qty 2

## 2018-02-27 MED ORDER — APIXABAN 5 MG PO TABS: 10.0000 mg | ORAL_TABLET | Freq: Two times a day (BID) | ORAL | 0 refills | Status: DC

## 2018-02-27 MED ORDER — APIXABAN 5 MG PO TABS: 5.0000 mg | ORAL_TABLET | Freq: Two times a day (BID) | ORAL | 1 refills | Status: DC

## 2018-02-27 NOTE — Progress Notes (Signed)
[place] for heparin drip  Indication: pulmonary embolus  No Known Allergies  Patient Measurements: Height: 6\' 2"  (188 cm) Weight: 287 lb 14.4 oz (130.6 kg) IBW/kg (Calculated) : 82.2 Heparin Dosing Weight: 110.7 kg   Vital Signs: Temp: 98.1 F (36.7 C) (04/06 0541) Temp Source: Oral (04/06 0541) BP: 142/82 (04/06 0541) Pulse Rate: 63 (04/06 0541)  Labs: Recent Labs    02/25/18 0845  02/26/18 1728 02/26/18 2314 02/27/18 0524  HGB 13.8  --  13.9  --  13.8  HCT 40.8  --  40.7  --  40.9  PLT 131*  --  159  --  156  APTT 28  --   --   --   --   LABPROT 13.4  --   --   --   --   INR 1.03  --   --   --   --   HEPARINUNFRC  --    < > 0.57 0.46 0.36  CREATININE 1.17  --   --   --   --   TROPONINI <0.03  --   --   --   --    < > = values in this interval not displayed.    Estimated Creatinine Clearance: 89.2 mL/min (by C-G formula based on SCr of 1.17 mg/dL).   Medical History: Past Medical History:  Diagnosis Date  . Arthritis    r hip  . 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 Diverticulitis    pt denies  . Hyperlipidemia   . Hypertension   . OSA (obstructive sleep apnea)   . Sleep apnea    CPAP    Assessment: 67 year old male present to ED with pulmonary embolism. Baseline CBC ordered. Based on current medication reconciliation, patient was not on anticoagulant prior to admission.   Goal of Therapy:  Heparin level 0.3-0.7 units/ml Monitor platelets by anticoagulation protocol: Yes   Plan:  HL = 0.57, therapeutic. Will continue at current rate of 1800 units/hr. Will obtain confirmatory heparin level in 6 hours.   Pharmacy to continue to monitor and follow per protocol.    Lendon Ka, PharmD Pharmacy Resident 02/27/2018,5:50 AM    4/5 PM heparin level 0.46. Continue current regimen. Recheck heparin level and CBC with tomorrow AM labs.  4/6 AM heparin level 0.36. Continue current regimen.  Recheck heparin level and CBC with tomorrow AM labs.  Sim Boast, PharmD, BCPS  02/27/18 5:50 AM

## 2018-02-27 NOTE — Progress Notes (Signed)
Patient understood his medication instructions.  He and his wife will get his medicines at the pharmacy.  Both IVs removed.  Patient left in a private vehicle with his wife.

## 2018-02-27 NOTE — Discharge Summary (Signed)
[facility] [place] at [facility], 67 y.o., DOB 26-Jan-1951, MRN [id number]. Admission date: 02/25/2018 Discharge Date 02/27/2018 Primary MD Venia Carbon, MD Admitting Physician Fritzi Mandes, MD  Admission Diagnosis  Pleurodynia [R07.81] Pulmonary embolism ([place]) [I26.99] Acute pulmonary embolism without acute cor pulmonale, unspecified pulmonary embolism type [facility]) [I26.99]  Discharge Diagnosis   Active Problems:   Pulmonary emboli [facility])   DVT Essential hypertension Hyperlipdemia Obesity     Hospital Course   Russell Garrett  is a 67 y.o. male with a known history of morbid obesity, right hip arthritis, hypertension, obstructive sleep apnea comes to the emergency room accompanied by his wife after he experienced sharp chest pain left side of pleuritic radiating to the left shoulder which shortness of breath that woke him up around 4:30 in the morning. Pt came to ED was noted to pulmonary embolism and DVT.  Treated with IV heparin was seen by vascular no intervention for DVT.  Patient's had hypercoagulable blood work drawn.  He is switched to Eliquis.  He will need to follow-up outpatient with hematology.             Consults  None  Significant Tests:  See full reports for all details     Dg Chest 2 View  Result Date: 02/25/2018 CLINICAL DATA:  Shortness of breath and chest pain. EXAM: CHEST - 2 VIEW COMPARISON:  07/19/2015 FINDINGS: Normal heart size. Bilateral pleural effusions are identified. Asymmetric elevation of right hemidiaphragm. Pulmonary vascular congestion noted. IMPRESSION: 1. Mild CHF. Electronically Signed   By: Kerby Moors M.D.   On: 02/25/2018 09:19   Ct Angio Chest Pe W And/or Wo Contrast  Result Date: 02/25/2018 CLINICAL DATA:  Worsening shortness of breath during the past week, now with left-sided chest pain radiating to the arm. Evaluate for pulmonary embolism. EXAM: CT ANGIOGRAPHY CHEST WITH CONTRAST TECHNIQUE: Multidetector  CT imaging of the chest was performed using the standard protocol during bolus administration of intravenous contrast. Multiplanar CT image reconstructions and MIPs were obtained to evaluate the vascular anatomy. CONTRAST:  91mL OMNIPAQUE IOHEXOL 350 MG/ML SOLN COMPARISON:  Chest CT - 11/25/2006 FINDINGS: Vascular Findings: There is adequate opacification of the pulmonary arterial system with the main pulmonary artery measuring 229 Hounsfield units. There is nonocclusive embolism seen within the left lower lobe pulmonary artery (image 43, series 4; coronal image 75, series 7), extending to involve left lower lobe segmental branches as well supplying the lingula (image 47, series 4). No definite evidence of right-sided pulmonary embolism. Overall clot burden is deemed small in volume. Normal right to left ventricular ratio without CT evidence of right-sided heart strain. Additionally, there is no reflux of contrast into the intrahepatic venous system. Borderline enlarged caliber of the main pulmonary artery measuring 3.3 cm in diameter, similar to the 11/2006 examination. Normal heart size.  No pericardial effusion. Scattered atherosclerotic plaque within the abdominal aorta. Mild aneurysmal dilatation of the ascending thoracic aorta measuring 4.8 cm in diameter (image 38, series 4), grossly unchanged compared to the 11/2006 examination. The thoracic aorta tapers to a normal caliber at the level of the aortic arch. Bovine configuration of the aortic arch. The branch vessels of the aortic arch appear patent throughout their course. Review of the MIP images confirms the above findings. ---------------------------------------------------------------------------------- Nonvascular Findings: Mediastinum/Lymph Nodes: Scattered mediastinal lymph nodes are numerous though individually not enlarged by size criteria with index right-sided paratracheal and AP window lymph nodes measuring 0.9 cm in greatest  short axis diameter  (images 25 and 31, series 4). No bulky mediastinal, hilar axillary lymphadenopathy. Lungs/Pleura: Contrast opacities within the inferior segment of the lingula may represent atelectasis versus pulmonary infarction. Minimal subsegmental atelectasis within the right lower lobe. Trace left-sided pleural effusion. No pneumothorax. The central pulmonary airways appear widely patent. No discrete pulmonary nodules. Upper abdomen: Limited evaluation of the upper abdomen demonstrates 2 adjacent exophytic cyst arising from the superior pole of the right kidney with dominant cyst measuring approximately 3.3 cm (image 95, series 4). Note is made of a small splenule. Small hiatal hernia. Musculoskeletal: Regional soft tissues appear normal. Normal appearance of the thyroid gland. No acute or aggressive osseous abnormalities. Stigmata of DISH within the thoracic spine. IMPRESSION: 1. Examination is positive for small burden left-sided pulmonary embolism as detailed above. No CT evidence of right-sided heart strain. 2. Ground-glass opacities within the lingula may represent atelectasis though of all vein pulmonary infarct could have a similar appearance. 3. Trace left-sided pleural effusion, likely reactive. 4.  Aortic Atherosclerosis (ICD10-I70.0). 5. 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 Aortic aneurysm NOS (ICD10-I71.9). Recommend semi-annual imaging followup by CTA or MRA and referral to cardiothoracic surgery if not already obtained. This recommendation follows 2010 ACCF/AHA/AATS/ACR/ASA/SCA/SCAI/SIR/STS/SVM Guidelines for the Diagnosis and Management of Patients With Thoracic Aortic Disease. Circulation. 2010; 121: Y650-P546 Critical Value/emergent results were called by telephone at the time of interpretation on 02/25/2018 at 11:10 am to Dr. Lenise Arena , who verbally acknowledged these results. Electronically Signed   By:  Sandi Mariscal M.D.   On: 02/25/2018 11:19   US Venous Img Lower Bilateral  Result Date: 02/25/2018 CLINICAL DATA:  History of pulmonary embolism now with chronic bilateral lower extremity swelling. Evaluate for DVT. EXAM: BILATERAL LOWER EXTREMITY VENOUS DOPPLER ULTRASOUND TECHNIQUE: Gray-scale sonography with graded compression, as well as color Doppler and duplex ultrasound were performed to evaluate the lower extremity deep venous systems from the level of the common femoral vein and including the common femoral, femoral, profunda femoral, popliteal and calf veins including the posterior tibial, peroneal and gastrocnemius veins when visible. The superficial great saphenous vein was also interrogated. Spectral Doppler was utilized to evaluate flow at rest and with distal augmentation maneuvers in the common femoral, femoral and popliteal veins. COMPARISON:  None. FINDINGS: RIGHT LOWER EXTREMITY Common Femoral Vein: No evidence of thrombus. Normal compressibility, respiratory phasicity and response to augmentation. Saphenofemoral Junction: No evidence of thrombus. Normal compressibility and flow on color Doppler imaging. Profunda Femoral Vein: No evidence of thrombus. Normal compressibility and flow on color Doppler imaging. Femoral Vein: No evidence of thrombus. Normal compressibility, respiratory phasicity and response to augmentation. Popliteal Vein: There is mixed echogenic nonocclusive wall thickening/DVT within the right popliteal vein (images 22 through 28). Calf Veins: No evidence of thrombus. Normal compressibility and flow on color Doppler imaging. Superficial Great Saphenous Vein: No evidence of thrombus. Normal compressibility. Other Findings:  None. LEFT LOWER EXTREMITY Common Femoral Vein: There is hypoechoic expansile near occlusive thrombus within the left common femoral vein (images 34 and 35. The saphenofemoral junction as well as the deep femoral vein both appear patent. There is hypoechoic near  occlusive thrombus seen throughout the atretic left femoral vein (representative images 46 through 53). There is mixed echogenic near occlusive thrombus within the left popliteal vein (images 54 through 60). The left calf veins are not well imaged. Other Findings:  None. IMPRESSION: 1. Examination is positive  for age-indeterminate near occlusive DVT extending from the left common femoral through the left femoral and popliteal veins. While some of the thrombus appears mixed echogenic and thus potentially chronic, in the absence of prior examinations, an acute on chronic process is not excluded. Clinical correlation is advised. 2. 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 Electronically Signed   By: Sandi Mariscal M.D.   On: 02/25/2018 15:52       Today   Subjective:   Russell Garrett patient doing much better denies any complaints  Objective:   Blood pressure (!) 144/87, pulse 70, temperature 98.1 F (36.7 C), temperature source Oral, resp. rate 17, height 6\' 2"  (1.88 m), weight 287 lb 14.4 oz (130.6 kg), SpO2 95 %.  .  Intake/Output Summary (Last 24 hours) at 02/27/2018 1421 Last data filed at 02/27/2018 1034 Gross per 24 hour  Intake 120 ml  Output -  Net 120 ml    Exam VITAL SIGNS: Blood pressure (!) 144/87, pulse 70, temperature 98.1 F (36.7 C), temperature source Oral, resp. rate 17, height 6\' 2"  (1.88 m), weight 287 lb 14.4 oz (130.6 kg), SpO2 95 %.  GENERAL:  67 y.o.-year-old patient lying in the bed with no acute distress.  EYES: Pupils equal, round, reactive to light and accommodation. No scleral icterus. Extraocular muscles intact.  HEENT: Head atraumatic, normocephalic. Oropharynx and nasopharynx clear.  NECK:  Supple, no jugular venous distention. No thyroid enlargement, no tenderness.  LUNGS: Normal breath sounds bilaterally, no wheezing, rales,rhonchi or crepitation. No use of accessory muscles of respiration.  CARDIOVASCULAR:  S1, S2 normal. No murmurs, rubs, or gallops.  ABDOMEN: Soft, nontender, nondistended. Bowel sounds present. No organomegaly or mass.  EXTREMITIES: No pedal edema, cyanosis, or clubbing.  NEUROLOGIC: Cranial nerves II through XII are intact. Muscle strength 5/5 in all extremities. Sensation intact. Gait not checked.  PSYCHIATRIC: The patient is alert and oriented x 3.  SKIN: No obvious rash, lesion, or ulcer.   Data Review     CBC w Diff:  Lab Results  Component Value Date   WBC 6.8 02/27/2018   HGB 13.8 02/27/2018   HCT 40.9 02/27/2018   PLT 156 02/27/2018   LYMPHOPCT 28.0 03/17/2017   MONOPCT 11.1 03/17/2017   EOSPCT 7.1 (H) 03/17/2017   BASOPCT 2.3 03/17/2017   CMP:  Lab Results  Component Value Date   NA 142 02/25/2018   K 4.1 02/25/2018   CL 106 02/25/2018   CO2 30 02/25/2018   BUN 17 02/25/2018   CREATININE 1.17 02/25/2018   PROT 7.3 03/17/2017   ALBUMIN 4.4 03/17/2017   BILITOT 0.8 03/17/2017   ALKPHOS 76 03/17/2017   AST 24 03/17/2017   ALT 34 03/17/2017  .  Micro Results No results found for this or any previous visit (from the past 240 hour(s)).      Code Status Orders  (From admission, onward)        Start     Ordered   02/25/18 1640  Full code  Continuous     02/25/18 1639    Code Status History    This patient has a current code status but no historical code status.          Follow-up Information    Viviana Simpler I, MD Follow up in 6 day(s).   Specialties:  Internal Medicine, Pediatrics Contact information: [address] [place] [address] [phone]        Lloyd Huger,  MD Follow up in 3 week(s).   Specialty:  Oncology Why:  pt with pulmonary embolism Contact information: [place] [address] [phone]           Discharge Medications   Allergies as of 02/27/2018   No Known Allergies     Medication List    TAKE these medications   apixaban 5 MG Tabs tablet Commonly  known as:  ELIQUIS Take 2 tablets (10 mg total) by mouth 2 (two) times daily for 7 days.   apixaban 5 MG Tabs tablet Commonly known as:  ELIQUIS Take 1 tablet (5 mg total) by mouth 2 (two) times daily. Start taking on:  03/07/2018   aspirin 81 MG tablet Take 81 mg by mouth daily.   atorvastatin 40 MG tablet Commonly known as:  LIPITOR Take 1 tablet (40 mg total) by mouth daily.   Cannabidiol 100 MG/ML Soln Take 50 mg by mouth daily.   losartan-hydrochlorothiazide 50-12.5 MG tablet Commonly known as:  HYZAAR Take 1 tablet by mouth daily.   naproxen sodium 220 MG tablet Commonly known as:  ALEVE Take 220 mg by mouth 2 (two) times daily with a meal.          Total Time in preparing paper work, data evaluation and todays exam - 48 minutes  Dustin Flock M.D on 02/27/2018 at 2:21 [place]  [phone]

## 2018-02-27 NOTE — Discharge Instructions (Signed)
[facility] - [place] at [facility] ° °DIET:  °Cardiac diet ° °DISCHARGE CONDITION:  °Stable ° °ACTIVITY:  °Activity as tolerated ° °OXYGEN:  °Home Oxygen: No. °  °Oxygen Delivery: room air ° °DISCHARGE LOCATION:  °home  ° ° °ADDITIONAL DISCHARGE INSTRUCTION: ° ° °If you experience worsening of your admission symptoms, develop shortness of breath, life threatening emergency, suicidal or homicidal thoughts you must seek medical attention immediately by calling 911 or calling your MD immediately  if symptoms less severe. ° °You Must read complete instructions/literature along with all the possible adverse reactions/side effects for all the Medicines you take and that have been prescribed to you. Take any new Medicines after you have completely understood and accpet all the possible adverse reactions/side effects.  ° °Please note ° °You were cared for by a hospitalist during your hospital stay. If you have any questions about your discharge medications or the care you received while you were in the hospital after you are discharged, you can call the unit and asked to speak with the hospitalist on call if the hospitalist that took care of you is not available. Once you are discharged, your primary care physician will handle any further medical issues. Please note that NO REFILLS for any discharge medications will be authorized once you are discharged, as it is imperative that you return to your primary care physician (or establish a relationship with a primary care physician if you do not have one) for your aftercare needs so that they can reassess your need for medications and monitor your lab values. ° ° °

## 2018-02-27 NOTE — Progress Notes (Signed)
[place] for Eliquis  Indication: pulmonary embolus  No Known Allergies  Patient Measurements: Height: 6\' 2"  (188 cm) Weight: 287 lb 14.4 oz (130.6 kg) IBW/kg (Calculated) : 82.2 Heparin Dosing Weight: 110.7 kg   Vital Signs: Temp: 98.1 F (36.7 C) (04/06 0541) Temp Source: Oral (04/06 0541) BP: 144/87 (04/06 0957) Pulse Rate: 70 (04/06 0957)  Labs: Recent Labs    02/25/18 0845  02/26/18 1728 02/26/18 2314 02/27/18 0524  HGB 13.8  --  13.9  --  13.8  HCT 40.8  --  40.7  --  40.9  PLT 131*  --  159  --  156  APTT 28  --   --   --   --   LABPROT 13.4  --   --   --   --   INR 1.03  --   --   --   --   HEPARINUNFRC  --    < > 0.57 0.46 0.36  CREATININE 1.17  --   --   --   --   TROPONINI <0.03  --   --   --   --    < > = values in this interval not displayed.    Estimated Creatinine Clearance: 89.2 mL/min (by C-G formula based on SCr of 1.17 mg/dL).   Medical History: Past Medical History:  Diagnosis Date  . Arthritis    r hip  . 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 Diverticulitis    pt denies  . Hyperlipidemia   . Hypertension   . OSA (obstructive sleep apnea)   . Sleep apnea    CPAP    Assessment: 67 year old male present to ED with pulmonary embolism. Pharmacy originally consulted for heparin dosing.  Pharmacy now consulted for transition to Eliquis.   Goal of Therapy:  Heparin level 0.3-0.7 units/ml Monitor platelets by anticoagulation protocol: Yes   Plan:  Start Eliquis (Apixaban) 10mg  twice daily for 7 days, followed by Eliquis 5mg  twice daily.  Heparin drip has been discontinued.   Pernell Dupre, PharmD, BCPS Clinical Pharmacist 02/27/2018 12:27 PM

## 2018-02-28 LAB — ANTITHROMBIN III: ANTITHROMB III FUNC: 76 % (ref 75–120)

## 2018-03-01 ENCOUNTER — Telehealth: Payer: Self-pay

## 2018-03-01 LAB — HOMOCYSTEINE: Homocysteine: 11.6 umol/L (ref 0.0–15.0)

## 2018-03-01 NOTE — Telephone Encounter (Signed)
Transition Care Management Follow-up Telephone Call   Date discharged? 02/27/2018   How have you been since you were released from the hospital? "I am still hurting but my breathing is much better"   Do you understand why you were in the hospital? yes   Do you understand the discharge instructions? yes   Where were you discharged to? Home    Items Reviewed:  Medications reviewed: Yes  Allergies reviewed: Yes  Dietary changes reviewed: Yes, heart health diet, lower NA content  Referrals reviewed: Suggested referral to oncology/hematology to further evaluate reason for PE   Functional Questionnaire:   Activities of Daily Living (ADLs):   He states they are independent in the following: ALL ADL'S: dressing, grooming, bathing, toileting, ambulation ad lib, feeding.   States they require assistance with the following: Requires no assistance.    Any transportation issues/concerns?: NO   Any patient concerns? Does have several questions regarding medications and referral to oncology. Also, states that wife has several questions but nothing that needs to be addressed prior to seeing Dr. Silvio Pate on Thursday.  They will bring all questions to appt.    Confirmed importance and date/time of follow-up visits scheduled Yes  Provider Appointment booked with Dr. Silvio Pate for 03/04/18 at 2:00pm.   Confirmed with patient if condition begins to worsen call PCP or go to the ER.  Patient was given the office number and encouraged to call back with question or concerns.  : Yes

## 2018-03-02 LAB — PROTEIN C ACTIVITY: PROTEIN C ACTIVITY: 117 % (ref 73–180)

## 2018-03-02 LAB — PROTEIN S ACTIVITY: PROTEIN S ACTIVITY: 43 % — AB (ref 63–140)

## 2018-03-02 LAB — PROTEIN S, TOTAL: Protein S Ag, Total: 120 % (ref 60–150)

## 2018-03-03 ENCOUNTER — Other Ambulatory Visit: Payer: Self-pay

## 2018-03-03 LAB — PTT-LA MIX: PTT-LA Mix: 60 s — ABNORMAL HIGH (ref 0.0–48.9)

## 2018-03-03 LAB — BETA-2-GLYCOPROTEIN I ABS, IGG/M/A
Beta-2-Glycoprotein I IgA: <9 GPI IgA units (ref 0–25)
Beta-2-Glycoprotein I IgM: <9 GPI IgM units (ref 0–32)

## 2018-03-03 LAB — LUPUS ANTICOAGULANT PANEL
DRVVT: 35.5 s (ref 0.0–47.0)
PTT LA: 65.3 s — AB (ref 0.0–51.9)

## 2018-03-03 LAB — CARDIOLIPIN ANTIBODIES, IGG, IGM, IGA
Anticardiolipin IgA: <9 APL U/mL (ref 0–11)
Anticardiolipin IgM: 9 MPL U/mL (ref 0–12)

## 2018-03-03 LAB — PROTEIN C, TOTAL: PROTEIN C, TOTAL: 103 % (ref 60–150)

## 2018-03-03 LAB — HEXAGONAL PHASE PHOSPHOLIPID: HEXAGONAL PHASE PHOSPHOLIPID: 0 s (ref 0–11)

## 2018-03-03 NOTE — Patient Outreach (Signed)
[facility]) Care Management  03/03/2018  Russell Garrett 1950-12-03 [id number]   EMMI- General Discharge RED ON EMMI ALERT Day # 1 Date: 03/02/18 Red Alert Reason: Unfilled prescriptions? yes   Outreach attempt # 1 Spoke with patient.  He states how long would call take.  Advised only a few minutes.  Patient verified HIPAA but asked why did CM need that information CM should have it.  Explained to patient that it is to protect his identity to insure that I am speaking with the right person.  Addressed red alert with patient.  He states that he has not run out of medication yet but the pharmacist did not have all his medication at the time of pick up but would have it today.  Patient states the machine did not allow him to expand on question.  Patient states he also has an appointment with his primary care physician on tomorrow and his doing ok.  He voices no concerns.    Plan: RN CM will close case.    Jone Baseman, RN, MSN [facility] Care Management Care Management Coordinator Direct Line [phone] Toll Free: [phone]  Fax: [phone]

## 2018-03-04 ENCOUNTER — Ambulatory Visit (INDEPENDENT_AMBULATORY_CARE_PROVIDER_SITE_OTHER): Payer: PPO | Admitting: Internal Medicine

## 2018-03-04 ENCOUNTER — Encounter: Payer: Self-pay | Admitting: Internal Medicine

## 2018-03-04 VITALS — BP 132/84 | HR 68 | Temp 97.4°F | Resp 16 | Ht 74.0 in | Wt 289.0 lb

## 2018-03-04 DIAGNOSIS — I2699 Other pulmonary embolism without acute cor pulmonale: Secondary | ICD-10-CM

## 2018-03-04 DIAGNOSIS — I712 Thoracic aortic aneurysm, without rupture, unspecified: Secondary | ICD-10-CM | POA: Insufficient documentation

## 2018-03-04 DIAGNOSIS — I82409 Acute embolism and thrombosis of unspecified deep veins of unspecified lower extremity: Secondary | ICD-10-CM | POA: Insufficient documentation

## 2018-03-04 DIAGNOSIS — I700 Atherosclerosis of aorta: Secondary | ICD-10-CM | POA: Diagnosis not present

## 2018-03-04 DIAGNOSIS — Z86711 Personal history of pulmonary embolism: Secondary | ICD-10-CM | POA: Insufficient documentation

## 2018-03-04 LAB — PROTHROMBIN GENE MUTATION

## 2018-03-04 NOTE — Progress Notes (Signed)
Subjective:    Patient ID: Russell Garrett, male    DOB: 10-15-1951, 67 y.o.   MRN: [id number]  HPI Here with wife for hospital follow up Reviewed hospital records and discharge summary  Woke at 4:30AM with stabbing chest pain Reminded him of previous time where he was diagnosed with pneumonia Some SOB for about a week---like going up steps Due to severity, wife drove him to ER Diagnosed with bilateral DVT and pulmonary embolism--LLL (corresponded with his pain site) CT also showed ascending thoracic aneurysm (unchanged from 2008) as well as aortic atherosclerosis  Still with some residual left flank pain Breathing is much better---still some DOE but improved Some cough   Current Outpatient Medications on File Prior to Visit  Medication Sig Dispense Refill  . apixaban (ELIQUIS) 5 MG TABS tablet Take 2 tablets (10 mg total) by mouth 2 (two) times daily for 7 days. 28 tablet 0  . [START ON 03/07/2018] apixaban (ELIQUIS) 5 MG TABS tablet Take 1 tablet (5 mg total) by mouth 2 (two) times daily. 60 tablet 1  . aspirin 81 MG tablet Take 81 mg by mouth daily.      Marland Kitchen atorvastatin (LIPITOR) 40 MG tablet Take 1 tablet (40 mg total) by mouth daily. 30 tablet 11  . Cannabidiol 100 MG/ML SOLN Take 50 mg by mouth daily.    Marland Kitchen losartan-hydrochlorothiazide (HYZAAR) 50-12.5 MG tablet Take 1 tablet by mouth daily. 30 tablet 11  . naproxen sodium (ANAPROX) 220 MG tablet Take 220 mg by mouth 2 (two) times daily with a meal.    . [DISCONTINUED] lisinopril-hydrochlorothiazide (PRINZIDE,ZESTORETIC) 10-12.5 MG per tablet Take 1 tablet by mouth daily. 30 tablet 11   No current facility-administered medications on file prior to visit.     No Known Allergies  Past Medical History:  Diagnosis Date  . Arthritis    r hip  . 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 Diverticulitis    pt denies  . Hyperlipidemia   . Hypertension   . OSA (obstructive sleep apnea)   . Sleep apnea    CPAP    Past Surgical History:  Procedure Laterality Date  . BACK SURGERY    . COLONOSCOPY    . THORACENTESIS  08/2006   left pleural effusion    Family History  Problem Relation Age of Onset  . Hypertension Mother   . Heart disease Mother   . Kidney disease Mother   . Heart disease Father   . Stroke Father   . Prostate cancer Father   . Kidney disease Father   . Heart disease Brother        cardioversion for arrhythmia  . Diabetes Neg Hx   . Colon cancer Neg Hx     Social History   Socioeconomic History  . Marital status: Married    Spouse name: Not on file  . Number of children: 2  . Years of education: Not on file  . Highest education level: Not on file  Occupational History  . Occupation: retired- part Media planner Needs  . Financial resource strain: Not on file  . Food insecurity:    Worry: Not on file    Inability: Not on file  . Transportation needs:    Medical: Not on file    Non-medical: Not on file  Tobacco Use  . Smoking status: Former Smoker    Packs/day: 1.00    Years: 35.00  Pack years: 35.00    Types: Cigarettes    Last attempt to quit: 12/16/2015    Years since quitting: 2.2  . Smokeless tobacco: Never Used  Substance and Sexual Activity  . Alcohol use: Yes    Alcohol/week: 0.0 oz    Comment: occ  . Drug use: No  . Sexual activity: Not on file  Lifestyle  . Physical activity:    Days per week: Not on file    Minutes per session: Not on file  . Stress: Not on file  Relationships  . Social connections:    Talks on phone: Not on file    Gets together: Not on file    Attends religious service: Not on file    Active member of club or organization: Not on file    Attends meetings of clubs or organizations: Not on file    Relationship status: Not on file  . Intimate partner violence:    Fear of current or ex partner: Not on file    Emotionally abused: Not on file    Physically abused: Not on file    Forced sexual  activity: Not on file  Other Topics Concern  . Not on file  Social History Narrative   Widowed   Remarried June 2014      Has living will   Wife is health care POA   Would accept resuscitation    Probably no tube feeds if cognitively unaware.   Review of Systems No fever Has had some recent burping No recent heartburn Appetite is okay Using CBD oil for arthritic pain at night---seems to be helping Weight is up 5# or so    Objective:   Physical Exam  Constitutional: He appears well-developed. No distress.  Neck: No thyromegaly present.  Cardiovascular: Normal rate, regular rhythm and normal heart sounds. Exam reveals no gallop.  No murmur heard. Pulmonary/Chest: Effort normal and breath sounds normal. No respiratory distress. He has no wheezes. He has no rales.  Abdominal: Soft. There is no tenderness.  Musculoskeletal:  Calves are thick but no pitting or tenderness  Lymphadenopathy:    He has no cervical adenopathy.          Assessment & Plan:

## 2018-03-04 NOTE — Assessment & Plan Note (Signed)
Mild No change since 2008 Will decide whether to recheck this in a year

## 2018-03-04 NOTE — Assessment & Plan Note (Signed)
Pain seems to be from infarction Symptoms are better On the eliquis--will be decreasing the dose soon Protein S is low and he probably had another PE 11 years ago (though not clear cut) Asymptomatic DVT and unprovoked PE I think he needs to stay on eliquis indefinitely

## 2018-03-04 NOTE — Assessment & Plan Note (Signed)
Found on CT scan He is now on eliquis and continues on the statin

## 2018-03-04 NOTE — Patient Instructions (Signed)
Please try the [place]

## 2018-03-08 LAB — FACTOR 5 LEIDEN

## 2018-03-19 ENCOUNTER — Encounter: Payer: Self-pay | Admitting: Internal Medicine

## 2018-03-19 ENCOUNTER — Ambulatory Visit (INDEPENDENT_AMBULATORY_CARE_PROVIDER_SITE_OTHER): Payer: PPO | Admitting: Internal Medicine

## 2018-03-19 VITALS — BP 122/84 | HR 75 | Temp 97.4°F | Ht 73.25 in | Wt 282.0 lb

## 2018-03-19 DIAGNOSIS — I2699 Other pulmonary embolism without acute cor pulmonale: Secondary | ICD-10-CM

## 2018-03-19 DIAGNOSIS — I10 Essential (primary) hypertension: Secondary | ICD-10-CM | POA: Diagnosis not present

## 2018-03-19 DIAGNOSIS — Z23 Encounter for immunization: Secondary | ICD-10-CM

## 2018-03-19 DIAGNOSIS — Z0000 Encounter for general adult medical examination without abnormal findings: Secondary | ICD-10-CM | POA: Diagnosis not present

## 2018-03-19 DIAGNOSIS — Z7189 Other specified counseling: Secondary | ICD-10-CM | POA: Insufficient documentation

## 2018-03-19 DIAGNOSIS — E66812 Obesity, class 2: Secondary | ICD-10-CM | POA: Insufficient documentation

## 2018-03-19 DIAGNOSIS — E785 Hyperlipidemia, unspecified: Secondary | ICD-10-CM | POA: Diagnosis not present

## 2018-03-19 DIAGNOSIS — E669 Obesity, unspecified: Secondary | ICD-10-CM | POA: Insufficient documentation

## 2018-03-19 MED ORDER — LOSARTAN POTASSIUM-HCTZ 50-12.5 MG PO TABS: 1.0000 | ORAL_TABLET | Freq: Every day | ORAL | 11 refills | Status: DC

## 2018-03-19 MED ORDER — APIXABAN 5 MG PO TABS: 5.0000 mg | ORAL_TABLET | Freq: Two times a day (BID) | ORAL | 11 refills | Status: DC

## 2018-03-19 MED ORDER — TETANUS-DIPHTHERIA TOXOIDS TD 5-2 LFU IM INJ: 0.5000 mL | INJECTION | Freq: Once | INTRAMUSCULAR | 0 refills | Status: AC

## 2018-03-19 MED ORDER — ATORVASTATIN CALCIUM 40 MG PO TABS: 40.0000 mg | ORAL_TABLET | Freq: Every day | ORAL | 11 refills | Status: DC

## 2018-03-19 NOTE — Assessment & Plan Note (Signed)
No problems with statin Known atherosclerosis

## 2018-03-19 NOTE — Progress Notes (Signed)
Subjective:    Patient ID: Russell Garrett, male    DOB: 04/09/1951, 67 y.o.   MRN: [id number]  HPI Here for Medicare wellness visit and follow up of chronic health conditions Reviewed form and advanced directives Occasional drink of alcohol Stopped smoking Tries to ride bike occasionally---discussed fitness No falls No depression or anhedonia Vision and hearing are fine Independent with instrumental ADLs No memory problems  Starting to feel closer to normal No chest pain---unless he coughs really hard Gets easier DOE---like shoveling mulch. Tires easier No dizziness or syncope Still with edema---some better lately No palpitations  Still on statin No myalgias or GI symptoms  Is going to see Dr Maureen Ralphs Hips and knees hurt too bad Does okay on the bike Uses naproxen rarely for back  Trying CBD oil to help sleep--with hip and side pain Helping him sleep  Needs new Rx for CPAP Machine is not working right Generally uses it every night but not as effective lately  Current Outpatient Medications on File Prior to Visit  Medication Sig Dispense Refill  . Cannabidiol 100 MG/ML SOLN Take 50 mg by mouth daily.    . naproxen sodium (ANAPROX) 220 MG tablet Take 220 mg by mouth 2 (two) times daily with a meal.    . [DISCONTINUED] lisinopril-hydrochlorothiazide (PRINZIDE,ZESTORETIC) 10-12.5 MG per tablet Take 1 tablet by mouth daily. 30 tablet 11   No current facility-administered medications on file prior to visit.     No Known Allergies  Past Medical History:  Diagnosis Date  . Arthritis    r hip  . 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 Diverticulitis    pt denies  . Hyperlipidemia   . Hypertension   . OSA (obstructive sleep apnea)   . Sleep apnea    CPAP    Past Surgical History:  Procedure Laterality Date  . BACK SURGERY    . COLONOSCOPY    . THORACENTESIS  08/2006   left pleural effusion    Family History  Problem Relation Age of  Onset  . Hypertension Mother   . Heart disease Mother   . Kidney disease Mother   . Heart disease Father   . Stroke Father   . Prostate cancer Father   . Kidney disease Father   . Heart disease Brother        cardioversion for arrhythmia  . Diabetes Neg Hx   . Colon cancer Neg Hx     Social History   Socioeconomic History  . Marital status: Married    Spouse name: Not on file  . Number of children: 2  . Years of education: Not on file  . Highest education level: Not on file  Occupational History  . Occupation: retired- part Media planner Needs  . Financial resource strain: Not on file  . Food insecurity:    Worry: Not on file    Inability: Not on file  . Transportation needs:    Medical: Not on file    Non-medical: Not on file  Tobacco Use  . Smoking status: Former Smoker    Packs/day: 1.00    Years: 35.00    Pack years: 35.00    Types: Cigarettes    Last attempt to quit: 12/16/2015    Years since quitting: 2.2  . Smokeless tobacco: Never Used  Substance and Sexual Activity  . Alcohol use: Yes    Alcohol/week: 0.0 oz  Comment: occ  . Drug use: No  . Sexual activity: Not on file  Lifestyle  . Physical activity:    Days per week: Not on file    Minutes per session: Not on file  . Stress: Not on file  Relationships  . Social connections:    Talks on phone: Not on file    Gets together: Not on file    Attends religious service: Not on file    Active member of club or organization: Not on file    Attends meetings of clubs or organizations: Not on file    Relationship status: Not on file  . Intimate partner violence:    Fear of current or ex partner: Not on file    Emotionally abused: Not on file    Physically abused: Not on file    Forced sexual activity: Not on file  Other Topics Concern  . Not on file  Social History Narrative   Widowed   Remarried June 2014      Has living will   Wife is health care POA--- alternate is daughter,  Magda Paganini   Would accept resuscitation    Probably no tube feeds if cognitively unaware.   Review of Systems Appetite is better Has lost some weight ---started [facility] recently Sleeps okay in general (other than the allergy symptoms) Wears seat belt Teeth okay---keeps up dentist More sensitive to sun---discussed sunscreen. No worrisome lesions Bowels are fine. No blood Voids okay Sees chiropractor once a month    Objective:   Physical Exam  Constitutional: He is oriented to person, place, and time. He appears well-developed. No distress.  HENT:  Mouth/Throat: Oropharynx is clear and moist. No oropharyngeal exudate.  Neck: No thyromegaly present.  Cardiovascular: Normal rate, regular rhythm, normal heart sounds and intact distal pulses. Exam reveals no gallop.  No murmur heard. Pulmonary/Chest: Effort normal and breath sounds normal. No respiratory distress. He has no wheezes. He has no rales.  Abdominal: Soft. There is no tenderness.  Musculoskeletal: He exhibits no edema or tenderness.  Lymphadenopathy:    He has no cervical adenopathy.  Neurological: He is alert and oriented to person, place, and time.  President--- "Dwaine Deter, Bush" [phone] D-r-[id number]---no---- d-l-r-o-w Recall 3/3  Skin: No rash noted. No erythema.  Psychiatric: He has a normal mood and affect. His behavior is normal.          Assessment & Plan:

## 2018-03-19 NOTE — Assessment & Plan Note (Signed)
HTN and high cholesterol Recent PE Now on [place]---goal 10-20# loss per year

## 2018-03-19 NOTE — Patient Instructions (Signed)
DASH Eating Plan DASH stands for "Dietary Approaches to Stop Hypertension." The DASH eating plan is a healthy eating plan that has been shown to reduce high blood pressure (hypertension). It may also reduce your risk for type 2 diabetes, heart disease, and stroke. The DASH eating plan may also help with weight loss. What are tips for following this plan? General guidelines  Avoid eating more than 2,300 mg (milligrams) of salt (sodium) a day. If you have hypertension, you may need to reduce your sodium intake to 1,500 mg a day.  Limit alcohol intake to no more than 1 drink a day for nonpregnant women and 2 drinks a day for men. One drink equals 12 oz of beer, 5 oz of wine, or 1 oz of hard liquor.  Work with your health care provider to maintain a healthy body weight or to lose weight. Ask what an ideal weight is for you.  Get at least 30 minutes of exercise that causes your heart to beat faster (aerobic exercise) most days of the week. Activities may include walking, swimming, or biking.  Work with your health care provider or diet and nutrition specialist (dietitian) to adjust your eating plan to your individual calorie needs. Reading food labels  Check food labels for the amount of sodium per serving. Choose foods with less than 5 percent of the Daily Value of sodium. Generally, foods with less than 300 mg of sodium per serving fit into this eating plan.  To find whole grains, look for the word "whole" as the first word in the ingredient list. Shopping  Buy products labeled as "low-sodium" or "no salt added."  Buy fresh foods. Avoid canned foods and premade or frozen meals. Cooking  Avoid adding salt when cooking. Use salt-free seasonings or herbs instead of table salt or sea salt. Check with your health care provider or pharmacist before using salt substitutes.  Do not fry foods. Cook foods using healthy methods such as baking, boiling, grilling, and broiling instead.  Cook with  heart-healthy oils, such as olive, canola, soybean, or sunflower oil. Meal planning   Eat a balanced diet that includes: ? 5 or more servings of fruits and vegetables each day. At each meal, try to fill half of your plate with fruits and vegetables. ? Up to 6-8 servings of whole grains each day. ? Less than 6 oz of lean meat, poultry, or fish each day. A 3-oz serving of meat is about the same size as a deck of cards. One egg equals 1 oz. ? 2 servings of low-fat dairy each day. ? A serving of nuts, seeds, or beans 5 times each week. ? Heart-healthy fats. Healthy fats called Omega-3 fatty acids are found in foods such as flaxseeds and coldwater fish, like sardines, salmon, and mackerel.  Limit how much you eat of the following: ? Canned or prepackaged foods. ? Food that is high in trans fat, such as fried foods. ? Food that is high in saturated fat, such as fatty meat. ? Sweets, desserts, sugary drinks, and other foods with added sugar. ? Full-fat dairy products.  Do not salt foods before eating.  Try to eat at least 2 vegetarian meals each week.  Eat more home-cooked food and less restaurant, buffet, and fast food.  When eating at a restaurant, ask that your food be prepared with less salt or no salt, if possible. What foods are recommended? The items listed may not be a complete list. Talk with your dietitian about what   dietary choices are best for you. Grains Whole-grain or whole-wheat bread. Whole-grain or whole-wheat pasta. Brown rice. Oatmeal. Quinoa. Bulgur. Whole-grain and low-sodium cereals. Pita bread. Low-fat, low-sodium crackers. Whole-wheat flour tortillas. Vegetables Fresh or frozen vegetables (raw, steamed, roasted, or grilled). Low-sodium or reduced-sodium tomato and vegetable juice. Low-sodium or reduced-sodium tomato sauce and tomato paste. Low-sodium or reduced-sodium canned vegetables. Fruits All fresh, dried, or frozen fruit. Canned fruit in natural juice (without  added sugar). Meat and other protein foods Skinless chicken or turkey. Ground chicken or turkey. Pork with fat trimmed off. Fish and seafood. Egg whites. Dried beans, peas, or lentils. Unsalted nuts, nut butters, and seeds. Unsalted canned beans. Lean cuts of beef with fat trimmed off. Low-sodium, lean deli meat. Dairy Low-fat (1%) or fat-free (skim) milk. Fat-free, low-fat, or reduced-fat cheeses. Nonfat, low-sodium ricotta or cottage cheese. Low-fat or nonfat yogurt. Low-fat, low-sodium cheese. Fats and oils Soft margarine without trans fats. Vegetable oil. Low-fat, reduced-fat, or light mayonnaise and salad dressings (reduced-sodium). Canola, safflower, olive, soybean, and sunflower oils. Avocado. Seasoning and other foods Herbs. Spices. Seasoning mixes without salt. Unsalted popcorn and pretzels. Fat-free sweets. What foods are not recommended? The items listed may not be a complete list. Talk with your dietitian about what dietary choices are best for you. Grains Baked goods made with fat, such as croissants, muffins, or some breads. Dry pasta or rice meal packs. Vegetables Creamed or fried vegetables. Vegetables in a cheese sauce. Regular canned vegetables (not low-sodium or reduced-sodium). Regular canned tomato sauce and paste (not low-sodium or reduced-sodium). Regular tomato and vegetable juice (not low-sodium or reduced-sodium). Pickles. Olives. Fruits Canned fruit in a light or heavy syrup. Fried fruit. Fruit in cream or butter sauce. Meat and other protein foods Fatty cuts of meat. Ribs. Fried meat. Bacon. Sausage. Bologna and other processed lunch meats. Salami. Fatback. Hotdogs. Bratwurst. Salted nuts and seeds. Canned beans with added salt. Canned or smoked fish. Whole eggs or egg yolks. Chicken or turkey with skin. Dairy Whole or 2% milk, cream, and half-and-half. Whole or full-fat cream cheese. Whole-fat or sweetened yogurt. Full-fat cheese. Nondairy creamers. Whipped toppings.  Processed cheese and cheese spreads. Fats and oils Butter. Stick margarine. Lard. Shortening. Ghee. Bacon fat. Tropical oils, such as coconut, palm kernel, or palm oil. Seasoning and other foods Salted popcorn and pretzels. Onion salt, garlic salt, seasoned salt, table salt, and sea salt. Worcestershire sauce. Tartar sauce. Barbecue sauce. Teriyaki sauce. Soy sauce, including reduced-sodium. Steak sauce. Canned and packaged gravies. Fish sauce. Oyster sauce. Cocktail sauce. Horseradish that you find on the shelf. Ketchup. Mustard. Meat flavorings and tenderizers. Bouillon cubes. Hot sauce and Tabasco sauce. Premade or packaged marinades. Premade or packaged taco seasonings. Relishes. Regular salad dressings. Where to find more information:  National Heart, Lung, and Blood Institute: www.nhlbi.nih.gov  American Heart Association: www.heart.org Summary  The DASH eating plan is a healthy eating plan that has been shown to reduce high blood pressure (hypertension). It may also reduce your risk for type 2 diabetes, heart disease, and stroke.  With the DASH eating plan, you should limit salt (sodium) intake to 2,300 mg a day. If you have hypertension, you may need to reduce your sodium intake to 1,500 mg a day.  When on the DASH eating plan, aim to eat more fresh fruits and vegetables, whole grains, lean proteins, low-fat dairy, and heart-healthy fats.  Work with your health care provider or diet and nutrition specialist (dietitian) to adjust your eating plan to your individual   calorie needs. This information is not intended to replace advice given to you by your health care provider. Make sure you discuss any questions you have with your health care provider. Document Released: 10/30/2011 Document Revised: 11/03/2016 Document Reviewed: 11/03/2016 Elsevier Interactive Patient Education  2018 Elsevier Inc.  

## 2018-03-19 NOTE — Progress Notes (Signed)
Visual Acuity Screening   Right eye Left eye Both eyes  Without correction:     With correction: 20/20 20/15 20/13   Hearing Screening Comments: November 2018. Did not need hearing aids.

## 2018-03-19 NOTE — Addendum Note (Signed)
Addended by: Pilar Grammes on: 03/19/2018 02:24 PM   Modules accepted: Orders

## 2018-03-19 NOTE — Assessment & Plan Note (Signed)
I have personally reviewed the Medicare Annual Wellness questionnaire and have noted 1. The patient's medical and social history 2. Their use of alcohol, tobacco or illicit drugs 3. Their current medications and supplements 4. The patient's functional ability including ADL's, fall risks, home safety risks and hearing or visual             impairment. 5. Diet and physical activities 6. Evidence for depression or mood disorders  The patients weight, height, BMI and visual acuity have been recorded in the chart I have made referrals, counseling and provided education to the patient based review of the above and I have provided the pt with a written personalized care plan for preventive services.  I have provided you with a copy of your personalized plan for preventive services. Please take the time to review along with your updated medication list.  Rx for Td Yearly flu vaccine Pneumovax booster done Discussed exercise Colon due 2028 Defer PSA to next year at least

## 2018-03-19 NOTE — Assessment & Plan Note (Signed)
See social history 

## 2018-03-19 NOTE — Assessment & Plan Note (Signed)
BP Readings from Last 3 Encounters:  03/19/18 122/84  03/04/18 132/84  02/27/18 (!) 144/87   Good control

## 2018-03-19 NOTE — Assessment & Plan Note (Signed)
Infarction pain just about gone Breathing normalizing Needs to continue eliquis indefinitely

## 2018-04-02 ENCOUNTER — Telehealth: Payer: Self-pay | Admitting: Internal Medicine

## 2018-04-02 DIAGNOSIS — G4733 Obstructive sleep apnea (adult) (pediatric): Secondary | ICD-10-CM

## 2018-04-02 NOTE — Telephone Encounter (Signed)
I tried to call pt and unable to reach by phone.

## 2018-04-02 NOTE — Telephone Encounter (Signed)
Patient checking status. Please advise Call back [phone]

## 2018-04-02 NOTE — Telephone Encounter (Signed)
Copied from [place] [id number]. Topic: Quick Communication - See Telephone Encounter >> Apr 02, 2018 10:08 AM Margot Ables wrote: CRM for notification. See Telephone encounter for: 04/02/18.  Pt brought in RX for cpap replacement from Dr. Silvio Pate. The patient will be new to [facility] and they need a copy of the pts sleep study before they can provide services. Please fax to [phone].

## 2018-04-02 NOTE — Telephone Encounter (Signed)
I could not find a sleep study report. I will have to keep looking.

## 2018-04-05 NOTE — Telephone Encounter (Signed)
Spoke to pt. He said it was done at [facility]. I will work on finding it.

## 2018-04-05 NOTE — Telephone Encounter (Signed)
Spoke to pt. He said his Sleep Study was around 02-21-2000! We have no record of it. Huey Romans had it a long time ago when he 1st got his machine. He does not have a card in his current machine. Please advise next step.

## 2018-04-05 NOTE — Telephone Encounter (Signed)
If it has been that long, we may need to have him see a sleep specialist to repeat the study. See if he remembers where it was and see if we can track it down

## 2018-04-07 NOTE — Telephone Encounter (Signed)
Left message for medical records at [facility]

## 2018-04-08 NOTE — Telephone Encounter (Signed)
[place] called back yesterday afternoon and said they would have to get his record from storage and call us back in a few weeks when they got it to let us know if the report was in there.

## 2018-04-08 NOTE — Telephone Encounter (Signed)
Left detailed message on VM per DPR for pt to let him know the delay.

## 2018-04-09 DIAGNOSIS — M1711 Unilateral primary osteoarthritis, right knee: Secondary | ICD-10-CM | POA: Diagnosis not present

## 2018-04-09 DIAGNOSIS — M1611 Unilateral primary osteoarthritis, right hip: Secondary | ICD-10-CM | POA: Diagnosis not present

## 2018-04-12 NOTE — Telephone Encounter (Signed)
Let him know that I have put in a referral for a sleep specialist. They can sometimes do sleep studies at home now

## 2018-04-12 NOTE — Telephone Encounter (Signed)
Pt would like shannon to return his call. Pt is aware may take up to 3 wks for the records per pt the records are no longer needed.  Pt is on medicare now . Pt will need a new  sleep study per his insurance.

## 2018-04-12 NOTE — Telephone Encounter (Signed)
Spoke to pt. He said he will wait to hear from the sleep specialist.

## 2018-04-14 DIAGNOSIS — M1611 Unilateral primary osteoarthritis, right hip: Secondary | ICD-10-CM | POA: Diagnosis not present

## 2018-05-04 ENCOUNTER — Institutional Professional Consult (permissible substitution): Payer: PPO | Admitting: Internal Medicine

## 2018-05-07 ENCOUNTER — Encounter: Payer: Self-pay | Admitting: Internal Medicine

## 2018-05-07 ENCOUNTER — Ambulatory Visit (INDEPENDENT_AMBULATORY_CARE_PROVIDER_SITE_OTHER): Payer: PPO | Admitting: Internal Medicine

## 2018-05-07 VITALS — BP 130/84 | HR 88 | Ht 73.25 in | Wt 284.0 lb

## 2018-05-07 DIAGNOSIS — G4719 Other hypersomnia: Secondary | ICD-10-CM

## 2018-05-07 DIAGNOSIS — G4733 Obstructive sleep apnea (adult) (pediatric): Secondary | ICD-10-CM

## 2018-05-07 NOTE — Progress Notes (Signed)
[facility] Consultation      Assessment and Plan:  OSA - History of obstructive sleep apnea, has been on CPAP for 18 years or more, having trouble with machine currently. - Discussed with patient that per insurance, patient will need a new sleep study in order to qualify for a new CPAP machine, therefore we will order a new new sleep study.  Essential hypertension. - Above condition can be contributed to by obstructive sleep apnea, therefore adequate treatment of sleep apnea is important part of management.  Orders Placed This Encounter  Procedures  . Split night study   Return in about 2 months (around 07/07/2018).    Date: 05/07/2018  MRN# [id number] Russell Garrett May 16, 1966  Referring Physician: Dr. Silvio Pate.  Russell Garrett is a 67 y.o. old male seen in consultation for chief complaint of:    Chief Complaint  Patient presents with  . Consult    sleep issues: on CPAP machine: 9cm: needs new machine:     HPI:   Patient is currently on CPAP for diagnosis of obstructive sleep apnea.  He usually goes to bed between 10:11 PM, falls asleep quickly within 20 minutes.  He gets out of bed between 7 and 8 AM.  Ep[place] is 8 today. He got his CPAP about 18 years ago, he was snoring very badly at that time and he stopped breathing in his sleep. He uses it "religiously" and he is not sleepy during the day and is not snoring. He travels with it and it is difficult to travel with it.  2 of his children are on CPAP.     PMHX:   Past Medical History:  Diagnosis Date  . Arthritis    r hip  . 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 Diverticulitis    pt denies  . Hyperlipidemia   . Hypertension   . OSA (obstructive sleep apnea)   . Sleep apnea    CPAP   Surgical Hx:  Past Surgical History:  Procedure Laterality Date  . BACK SURGERY    . COLONOSCOPY    . THORACENTESIS  08/2006   left pleural effusion   Family Hx:  Family History    Problem Relation Age of Onset  . Hypertension Mother   . Heart disease Mother   . Kidney disease Mother   . Heart disease Father   . Stroke Father   . Prostate cancer Father   . Kidney disease Father   . Heart disease Brother        cardioversion for arrhythmia  . Diabetes Neg Hx   . Colon cancer Neg Hx    Social Hx:   Social History   Tobacco Use  . Smoking status: Former Smoker    Packs/day: 1.00    Years: 35.00    Pack years: 35.00    Types: Cigarettes    Last attempt to quit: 12/16/2015    Years since quitting: 2.3  . Smokeless tobacco: Never Used  Substance Use Topics  . Alcohol use: Yes    Alcohol/week: 0.0 oz    Comment: occ  . Drug use: No   Medication:    Current Outpatient Medications:  .  apixaban (ELIQUIS) 5 MG TABS tablet, Take 1 tablet (5 mg total) by mouth 2 (two) times daily., Disp: 60 tablet, Rfl: 11 .  atorvastatin (LIPITOR) 40 MG tablet, Take 1 tablet (40 mg total) by mouth daily., Disp: 30 tablet,  Rfl: 11 .  Cannabidiol 100 MG/ML SOLN, Take 50 mg by mouth daily., Disp: , Rfl:  .  losartan-hydrochlorothiazide (HYZAAR) 50-12.5 MG tablet, Take 1 tablet by mouth daily., Disp: 30 tablet, Rfl: 11 .  naproxen sodium (ANAPROX) 220 MG tablet, Take 220 mg by mouth 2 (two) times daily with a meal., Disp: , Rfl:    Allergies:  Patient has no known allergies.  Review of Systems: Gen:  Denies  fever, sweats, chills HEENT: Denies blurred vision, double vision. bleeds, sore throat Cvc:  No dizziness, chest pain. Resp:   Denies cough or sputum production, shortness of breath Gi: Denies swallowing difficulty, stomach pain. Gu:  Denies bladder incontinence, burning urine Ext:   No Joint pain, stiffness. Skin: No skin rash,  hives  Endoc:  No polyuria, polydipsia. Psych: No depression, insomnia. Other:  All other systems were reviewed with the patient and were negative other that what is mentioned in the HPI.   Physical Examination:   VS: BP 130/84 (BP  Location: Left Arm, Cuff Size: Normal)   Pulse 88   Ht 6' 1.25" (1.861 m)   Wt 284 lb (128.8 kg)   SpO2 93%   BMI 37.21 kg/m   General Appearance: No distress  Neuro:without focal findings,  speech normal,  HEENT: PERRLA, EOM intact.  Mallampati 3 Pulmonary: normal breath sounds, No wheezing.  CardiovascularNormal S1,S2.  No m/r/g.   Abdomen: Benign, Soft, non-tender. Renal:  No costovertebral tenderness  GU:  No performed at this time. Endoc: No evident thyromegaly, no signs of acromegaly. Skin:   warm, no rashes, no ecchymosis  Extremities: normal, no cyanosis, clubbing.  Other findings:    LABORATORY PANEL:   CBC No results for input(s): WBC, HGB, HCT, PLT in the last 168 hours. ------------------------------------------------------------------------------------------------------------------  Chemistries  No results for input(s): NA, K, CL, CO2, GLUCOSE, BUN, CREATININE, CALCIUM, MG, AST, ALT, ALKPHOS, BILITOT in the last 168 hours.  Invalid input(s): GFRCGP ------------------------------------------------------------------------------------------------------------------  Cardiac Enzymes No results for input(s): TROPONINI in the last 168 hours. ------------------------------------------------------------  RADIOLOGY:  No results found.     Thank  you for the consultation and for allowing [facility] to assist in the care of your patient. Our recommendations are noted above.  Please contact us if we can be of further service.   Marda Stalker, MD.  Board Certified in Internal Medicine, Pulmonary Medicine, [place], and Sleep Medicine.  [facility] Office Number: [phone]  Patricia Pesa, M.D.  Merton Border, M.D  05/07/2018

## 2018-05-07 NOTE — Patient Instructions (Addendum)
Will send for new sleep study.     Sleep Apnea       Sleep apnea is disorder that affects a person's sleep. A person with sleep apnea has abnormal pauses in their breathing when they sleep. It is hard for them to get a good sleep. This makes a person tired during the day. It also can lead to other physical problems. There are three types of sleep apnea. One type is when breathing stops for a short time because your airway is blocked (obstructive sleep apnea). Another type is when the brain sometimes fails to give the normal signal to breathe to the muscles that control your breathing (central sleep apnea). The third type is a combination of the other two types. HOME CARE   Take all medicine as told by your doctor.  Avoid alcohol, calming medicines (sedatives), and depressant drugs.  Try to lose weight if you are overweight. Talk to your doctor about a healthy weight goal.  Your doctor may have you use a device that helps to open your airway. It can help you get the air that you need. It is called a positive airway pressure (PAP) device.   MAKE SURE YOU:   Understand these instructions.  Will watch your condition.  Will get help right away if you are not doing well or get worse.  It may take approximately 1 month for you to get used to wearing her CPAP every night.  Be sure to work with your machine to get used to it, be patient, it may take time!

## 2018-05-25 ENCOUNTER — Ambulatory Visit: Payer: PPO | Attending: Internal Medicine

## 2018-05-25 DIAGNOSIS — I2699 Other pulmonary embolism without acute cor pulmonale: Secondary | ICD-10-CM | POA: Diagnosis not present

## 2018-05-25 DIAGNOSIS — Z6837 Body mass index (BMI) 37.0-37.9, adult: Secondary | ICD-10-CM | POA: Diagnosis not present

## 2018-05-25 DIAGNOSIS — G4733 Obstructive sleep apnea (adult) (pediatric): Secondary | ICD-10-CM | POA: Insufficient documentation

## 2018-05-25 DIAGNOSIS — G4710 Hypersomnia, unspecified: Secondary | ICD-10-CM | POA: Diagnosis not present

## 2018-05-31 DIAGNOSIS — G4733 Obstructive sleep apnea (adult) (pediatric): Secondary | ICD-10-CM | POA: Diagnosis not present

## 2018-06-08 ENCOUNTER — Telehealth: Payer: Self-pay | Admitting: *Deleted

## 2018-06-08 DIAGNOSIS — G4733 Obstructive sleep apnea (adult) (pediatric): Secondary | ICD-10-CM

## 2018-06-08 NOTE — Telephone Encounter (Signed)
Patient aware of results Orders placed Nothing further needed. 

## 2018-06-23 DIAGNOSIS — M1611 Unilateral primary osteoarthritis, right hip: Secondary | ICD-10-CM | POA: Diagnosis not present

## 2018-06-24 DIAGNOSIS — G4733 Obstructive sleep apnea (adult) (pediatric): Secondary | ICD-10-CM | POA: Diagnosis not present

## 2018-07-09 ENCOUNTER — Ambulatory Visit: Payer: PPO | Admitting: Internal Medicine

## 2018-07-18 ENCOUNTER — Encounter: Payer: Self-pay | Admitting: Internal Medicine

## 2018-07-19 ENCOUNTER — Encounter: Payer: Self-pay | Admitting: Internal Medicine

## 2018-07-19 ENCOUNTER — Ambulatory Visit (INDEPENDENT_AMBULATORY_CARE_PROVIDER_SITE_OTHER): Payer: PPO | Admitting: Internal Medicine

## 2018-07-19 VITALS — BP 140/80 | HR 83 | Resp 16 | Ht 73.5 in | Wt 291.0 lb

## 2018-07-19 DIAGNOSIS — G4733 Obstructive sleep apnea (adult) (pediatric): Secondary | ICD-10-CM | POA: Diagnosis not present

## 2018-07-19 NOTE — Patient Instructions (Signed)
Continue to use CPAP every night for the entire night.

## 2018-07-19 NOTE — Progress Notes (Signed)
[facility] Consultation      Assessment and Plan:  OSA - Patient has been started on a new machine, will continue. -Repeat sleep study showed AHI 42.7, with excellent control on new CPAP.  Essential hypertension. - Above condition can be contributed to by obstructive sleep apnea, therefore adequate treatment of sleep apnea is important part of management.   Return in about 1 year (around 07/20/2019).    Date: 07/19/2018  MRN# [id number] RAIFE LIZER 05-15-1951  Referring Physician: Dr. Silvio Pate.  ESKER DEVER is a 67 y.o. old male seen in consultation for chief complaint of:    Chief Complaint  Patient presents with  . Sleep Apnea    DME:[facility] pt is doing well on cpap therapy.    HPI:   Patient is currently on CPAP for diagnosis of obstructive sleep apnea.  He usually goes to bed between 10:11 PM, falls asleep quickly within 20 minutes.  He gets out of bed between 7 and 8 AM.  Ep[place] is 8 today. At last visit he had an old CPAP that was no longer working well, he went for a new sleep study to reconfirm OSA and was started on a new PAP. He has been using it every night and feels well, he is no longer snoring and is not sleepy during the day.   **CPAP download 06/19/2018-07/18/2018>> raw data personally reviewed.  Usage greater than 4 hours is 25/25 days, average usage on days used is 9 hours 51 minutes, pressure ranges 10-20.  Median pressure 10, 90 95th percentile pressure 12, maximum pressure 13.  Residual AHI 0.3.  Overall this shows excellent compliance with CPAP with excellent control of obstructive sleep apnea.  **Sleep study split night 05/28/18>>AHI 42.7, recommend cpap at pressure of with pressure of 10-20  Medication:    Current Outpatient Medications:  .  apixaban (ELIQUIS) 5 MG TABS tablet, Take 1 tablet (5 mg total) by mouth 2 (two) times daily., Disp: 60 tablet, Rfl: 11 .  atorvastatin (LIPITOR) 40 MG tablet, Take 1 tablet (40 mg total) by mouth  daily., Disp: 30 tablet, Rfl: 11 .  Cannabidiol 100 MG/ML SOLN, Take 50 mg by mouth daily., Disp: , Rfl:  .  losartan-hydrochlorothiazide (HYZAAR) 50-12.5 MG tablet, Take 1 tablet by mouth daily., Disp: 30 tablet, Rfl: 11 .  naproxen sodium (ANAPROX) 220 MG tablet, Take 220 mg by mouth 2 (two) times daily with a meal., Disp: , Rfl:    Allergies:  Patient has no known allergies.    Review of Systems:  Constitutional: Feels well. Cardiovascular: No chest pain.  Pulmonary: Denies dyspnea.   The remainder of systems were reviewed and were found to be negative other than what is documented in the HPI.    Physical Examination:   VS: BP 140/80 (BP Location: Left Arm, Cuff Size: Large)   Pulse 83   Resp 16   Ht 6' 1.5" (1.867 m)   Wt 291 lb (132 kg)   SpO2 95%   BMI 37.87 kg/m   General Appearance: No distress  Neuro:without focal findings, mental status, speech normal, alert and oriented HEENT: PERRLA, EOM intact Pulmonary: No wheezing, No rales  CardiovascularNormal S1,S2.  No m/r/g.  Abdomen: Benign, Soft, non-tender, No masses Renal:  No costovertebral tenderness  GU:  No performed at this time. Endoc: No evident thyromegaly, no signs of acromegaly or Cushing features Skin:   warm, no rashes, no ecchymosis  Extremities: normal, no cyanosis, clubbing.  LABORATORY PANEL:   CBC No results for input(s): WBC, HGB, HCT, PLT in the last 168 hours. ------------------------------------------------------------------------------------------------------------------  Chemistries  No results for input(s): NA, K, CL, CO2, GLUCOSE, BUN, CREATININE, CALCIUM, MG, AST, ALT, ALKPHOS, BILITOT in the last 168 hours.  Invalid input(s): GFRCGP ------------------------------------------------------------------------------------------------------------------  Cardiac Enzymes No results for input(s): TROPONINI in the last 168  hours. ------------------------------------------------------------  RADIOLOGY:  No results found.     Thank  you for the consultation and for allowing [facility] to assist in the care of your patient. Our recommendations are noted above.  Please contact us if we can be of further service.   Marda Stalker, M.D., F.C.C.P.  Board Certified in Internal Medicine, Pulmonary Medicine, [place], and Sleep Medicine.  [facility] Office Number: [phone]   07/19/2018

## 2018-07-21 ENCOUNTER — Ambulatory Visit (INDEPENDENT_AMBULATORY_CARE_PROVIDER_SITE_OTHER): Payer: PPO | Admitting: Internal Medicine

## 2018-07-21 ENCOUNTER — Encounter: Payer: Self-pay | Admitting: Internal Medicine

## 2018-07-21 VITALS — BP 136/80 | HR 64 | Temp 98.5°F | Ht 73.0 in | Wt 289.0 lb

## 2018-07-21 DIAGNOSIS — Z01818 Encounter for other preprocedural examination: Secondary | ICD-10-CM

## 2018-07-21 DIAGNOSIS — I2699 Other pulmonary embolism without acute cor pulmonale: Secondary | ICD-10-CM

## 2018-07-21 NOTE — Assessment & Plan Note (Signed)
No recent symptoms of concern EKG earlier this year shows no ischemia Medically clear to proceed with elective THR

## 2018-07-21 NOTE — Progress Notes (Signed)
Subjective:    Patient ID: Russell Garrett, male    DOB: 1951-11-12, 67 y.o.   MRN: [id number]  HPI Here for preoperative evaluation Having right total hip by Dr Maureen Ralphs  No chest pain No SOB No dizziness or syncope Chronic ankle swelling is fairly stable No symptoms related to PE---still on eliquis  No recent cough or fever  Current Outpatient Medications on File Prior to Visit  Medication Sig Dispense Refill  . apixaban (ELIQUIS) 5 MG TABS tablet Take 1 tablet (5 mg total) by mouth 2 (two) times daily. 60 tablet 11  . atorvastatin (LIPITOR) 40 MG tablet Take 1 tablet (40 mg total) by mouth daily. 30 tablet 11  . Cannabidiol 100 MG/ML SOLN Take 50 mg by mouth daily.    Marland Kitchen losartan-hydrochlorothiazide (HYZAAR) 50-12.5 MG tablet Take 1 tablet by mouth daily. 30 tablet 11  . naproxen sodium (ANAPROX) 220 MG tablet Take 220 mg by mouth 2 (two) times daily with a meal.    . [DISCONTINUED] lisinopril-hydrochlorothiazide (PRINZIDE,ZESTORETIC) 10-12.5 MG per tablet Take 1 tablet by mouth daily. 30 tablet 11   No current facility-administered medications on file prior to visit.     No Known Allergies  Past Medical History:  Diagnosis Date  . Arthritis    r hip  . 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 Diverticulitis    pt denies  . Hyperlipidemia   . Hypertension   . OSA (obstructive sleep apnea)   . Sleep apnea    CPAP    Past Surgical History:  Procedure Laterality Date  . BACK SURGERY    . COLONOSCOPY    . THORACENTESIS  08/2006   left pleural effusion    Family History  Problem Relation Age of Onset  . Hypertension Mother   . Heart disease Mother   . Kidney disease Mother   . Heart disease Father   . Stroke Father   . Prostate cancer Father   . Kidney disease Father   . Heart disease Brother        cardioversion for arrhythmia  . Diabetes Neg Hx   . Colon cancer Neg Hx     Social History   Socioeconomic History  . Marital status:  Married    Spouse name: Not on file  . Number of children: 2  . Years of education: Not on file  . Highest education level: Not on file  Occupational History  . Occupation: retired- part Media planner Needs  . Financial resource strain: Not on file  . Food insecurity:    Worry: Not on file    Inability: Not on file  . Transportation needs:    Medical: Not on file    Non-medical: Not on file  Tobacco Use  . Smoking status: Former Smoker    Packs/day: 1.00    Years: 35.00    Pack years: 35.00    Types: Cigarettes    Last attempt to quit: 12/16/2015    Years since quitting: 2.5  . Smokeless tobacco: Never Used  Substance and Sexual Activity  . Alcohol use: Yes    Alcohol/week: 0.0 standard drinks    Comment: occ  . Drug use: No  . Sexual activity: Not on file  Lifestyle  . Physical activity:    Days per week: Not on file    Minutes per session: Not on file  . Stress: Not on file  Relationships  .  Social connections:    Talks on phone: Not on file    Gets together: Not on file    Attends religious service: Not on file    Active member of club or organization: Not on file    Attends meetings of clubs or organizations: Not on file    Relationship status: Not on file  . Intimate partner violence:    Fear of current or ex partner: Not on file    Emotionally abused: Not on file    Physically abused: Not on file    Forced sexual activity: Not on file  Other Topics Concern  . Not on file  Social History Narrative   Widowed   Remarried June 2014      Has living will   Wife is health care POA--- alternate is daughter, Magda Paganini   Would accept resuscitation    Probably no tube feeds if cognitively unaware.   Review of Systems Has lost weight till his hip worsened--not able to exercise 2 cortisone shots--first helped, but second really didn't    Objective:   Physical Exam  Constitutional: He appears well-developed. No distress.  Neck: No thyromegaly  present.  Cardiovascular: Normal rate, regular rhythm, normal heart sounds and intact distal pulses. Exam reveals no gallop.  No murmur heard. Respiratory: Effort normal and breath sounds normal. No respiratory distress. He has no wheezes. He has no rales.  GI: Soft. There is no tenderness.  Musculoskeletal:  1+ calf fullness and slight ankle edema-not new No tenderness Pain with ROM right hip--no internal rotation  Lymphadenopathy:    He has no cervical adenopathy.           Assessment & Plan:

## 2018-07-21 NOTE — Assessment & Plan Note (Signed)
Unprovoked Over 6 months ago Will continue eliquis indefinitely Okay to hold for 2 days preop---then restart immediately post op Early ambulation especially important Compression hose

## 2018-07-25 DIAGNOSIS — G4733 Obstructive sleep apnea (adult) (pediatric): Secondary | ICD-10-CM | POA: Diagnosis not present

## 2018-07-27 DIAGNOSIS — G4733 Obstructive sleep apnea (adult) (pediatric): Secondary | ICD-10-CM | POA: Diagnosis not present

## 2018-07-28 ENCOUNTER — Telehealth: Payer: Self-pay | Admitting: Internal Medicine

## 2018-07-28 NOTE — Telephone Encounter (Signed)
Called [facility] spoke with [place]. Pt was seen to soon. Pt will need another face to face visit. He was set up on 06/24/18.

## 2018-07-28 NOTE — Telephone Encounter (Signed)
Pt would like to discuss his sleep machine. He states [place] tells him that his machine will not be pain for due to him not having an appointment. Please call to discuss.

## 2018-07-28 NOTE — Telephone Encounter (Signed)
Pt has been informed that appointment is needed. Appt scheduled.

## 2018-07-30 ENCOUNTER — Ambulatory Visit: Payer: PPO | Admitting: Internal Medicine

## 2018-08-05 ENCOUNTER — Telehealth: Payer: Self-pay | Admitting: Internal Medicine

## 2018-08-05 DIAGNOSIS — M1611 Unilateral primary osteoarthritis, right hip: Secondary | ICD-10-CM | POA: Diagnosis not present

## 2018-08-05 NOTE — Telephone Encounter (Signed)
Pt dropped off Surgical Clearance form from [facility]. Put in [employer]

## 2018-08-05 NOTE — Telephone Encounter (Signed)
Form done I already saw him Send copy of 8/28 note as well

## 2018-08-06 NOTE — Progress Notes (Signed)
[facility] Consultation      Assessment and Plan:  OSA - Patient has been started on a new machine, and continues to do great.  At last visit he was also doing great, however he did not not fit in the 31-90-day follow-up window, therefore he was asked to come back today.  No charge for today's visit. -Repeat sleep study showed AHI 42.7, with excellent control on new CPAP.  Essential Hypertension.  - Above condition can be contributed to by obstructive sleep apnea, therefore adequate treatment of sleep apnea is important part of management.   Return in about 1 year (around 08/10/2019).    Date: 08/06/2018  MRN# [id number] KAREN KINNARD 06/04/1951  Referring Physician: Dr. Silvio Pate.  Russell Garrett is a 67 y.o. old male seen in consultation for chief complaint of:    Chief Complaint  Patient presents with  . Follow-up  . Sleep Apnea    doing well on CPAP:     HPI:  Patient's CPAP stopped working, therefore he was started on a new CPAP, required a new sleep study in order to qualify.  He has since started on the use CPAP and is doing great.  He uses it every night, he is no longer snoring, he is less sleepy during the day.  He use it every night for the entire night.  **CPAP data 07/07/2018-08/05/2018 uses greater than 4 hours is 30/30 days.  Average usage on days used is 9 hours 27 minutes.  Pressure ranges 10-20.  Median pressure is 10, 95th percentile pressure 11.7, maximum pressure 13.  Residual AHI 0.3, this shows excellent compliance with CPAP, with excellent control of obstructive sleep apnea. **CPAP download 06/19/2018-07/18/2018>> raw data personally reviewed.  Usage greater than 4 hours is 25/25 days, average usage on days used is 9 hours 51 minutes, pressure ranges 10-20.  Median pressure 10, 90 95th percentile pressure 12, maximum pressure 13.  Residual AHI 0.3.  Overall this shows excellent compliance with CPAP with excellent control of obstructive sleep  apnea.  **Sleep study split night 05/28/18>>AHI 42.7, recommend cpap at pressure of with pressure of 10-20  Medication:    Current Outpatient Medications:  .  apixaban (ELIQUIS) 5 MG TABS tablet, Take 1 tablet (5 mg total) by mouth 2 (two) times daily., Disp: 60 tablet, Rfl: 11 .  atorvastatin (LIPITOR) 40 MG tablet, Take 1 tablet (40 mg total) by mouth daily., Disp: 30 tablet, Rfl: 11 .  Cannabidiol 100 MG/ML SOLN, Take 50 mg by mouth daily., Disp: , Rfl:  .  losartan-hydrochlorothiazide (HYZAAR) 50-12.5 MG tablet, Take 1 tablet by mouth daily., Disp: 30 tablet, Rfl: 11 .  naproxen sodium (ANAPROX) 220 MG tablet, Take 220 mg by mouth 2 (two) times daily with a meal., Disp: , Rfl:    Allergies:  Patient has no known allergies.      LABORATORY PANEL:   CBC No results for input(s): WBC, HGB, HCT, PLT in the last 168 hours. ------------------------------------------------------------------------------------------------------------------  Chemistries  No results for input(s): NA, K, CL, CO2, GLUCOSE, BUN, CREATININE, CALCIUM, MG, AST, ALT, ALKPHOS, BILITOT in the last 168 hours.  Invalid input(s): GFRCGP ------------------------------------------------------------------------------------------------------------------  Cardiac Enzymes No results for input(s): TROPONINI in the last 168 hours. ------------------------------------------------------------  RADIOLOGY:  No results found.     Thank  you for the consultation and for allowing [facility] to assist in the care of your patient. Our recommendations are noted above.  Please contact us if  we can be of further service.   Marda Stalker, M.D., F.C.C.P.  Board Certified in Internal Medicine, Pulmonary Medicine, [place], and Sleep Medicine.  [facility] Office Number: [phone]   08/06/2018

## 2018-08-06 NOTE — Telephone Encounter (Signed)
Form faxed with 07/21/18 office note.

## 2018-08-09 ENCOUNTER — Encounter: Payer: Self-pay | Admitting: Internal Medicine

## 2018-08-09 ENCOUNTER — Ambulatory Visit (INDEPENDENT_AMBULATORY_CARE_PROVIDER_SITE_OTHER): Payer: PPO | Admitting: Internal Medicine

## 2018-08-09 VITALS — BP 138/88 | HR 58 | Ht 73.0 in | Wt 290.0 lb

## 2018-08-09 DIAGNOSIS — G4733 Obstructive sleep apnea (adult) (pediatric): Secondary | ICD-10-CM

## 2018-08-09 NOTE — Patient Instructions (Signed)
--  Continue using CPAP every night.  

## 2018-08-24 DIAGNOSIS — G4733 Obstructive sleep apnea (adult) (pediatric): Secondary | ICD-10-CM | POA: Diagnosis not present

## 2018-09-15 NOTE — Progress Notes (Signed)
Medical clearance Dr Viviana Simpler 08-10-18 on chart  LOV c Dr Silvio Pate 07-21-18 epic   LOV Pulm Dr Ashby Dawes 08-09-18 epic   ECHO 02-26-18 epic   EKG 02-25-18 epic   CT Chest 02-25-18 epic

## 2018-09-15 NOTE — Patient Instructions (Addendum)
Russell Garrett  09/15/2018   Your procedure is scheduled on: 09-27-18   Report to [facility]  Entrance    Report to admitting at 12:45PM    Call this number if you have problems the morning of surgery [phone]   PLEASE BRING CPAP MASK AND  TUBING ONLY. DEVICE WILL BE PROVIDED!    Remember: Do not eat food After Midnight. YOU MAY HAVE CLEAR LIQUIDS FROM MIDNIGHT UNTIL 9:15AM. NOTHING BY MOUTH AFTER 9:15AM! BRUSH YOUR TEETH MORNING OF SURGERY AND RINSE YOUR MOUTH OUT, NO CHEWING GUM CANDY OR MINTS.      CLEAR LIQUID DIET   Foods Allowed                                                                     Foods Excluded  Coffee and tea, regular and decaf                             liquids that you cannot  Plain Jell-O in any flavor                                             see through such as: Fruit ices (not with fruit pulp)                                     milk, soups, orange juice  Iced Popsicles                                    All solid food Carbonated beverages, regular and diet                                    Cranberry, grape and apple juices Sports drinks like Gatorade Lightly seasoned clear broth or consume(fat free) Sugar, honey syrup  Sample Menu Breakfast                                Lunch                                     Supper Cranberry juice                    Beef broth                            Chicken broth Jell-O                                     Grape juice  Apple juice Coffee or tea                        Jell-O                                      Popsicle                                                Coffee or tea                        Coffee or tea  _____________________________________________________________________       Take these medicines the morning of surgery with A SIP OF WATER: atorvastatin                                 You may not have any metal on your  body including hair pins and              piercings  Do not wear jewelry, make-up, lotions, powders or perfumes, deodorant                      Men may shave face and neck.   Do not bring valuables to the hospital. [place].  Contacts, dentures or bridgework may not be worn into surgery.  Leave suitcase in the car. After surgery it may be brought to your room.                  Please read over the following fact sheets you were given: _____________________________________________________________________             [facility] [place] - Preparing for Surgery Before surgery, you can play an important role.  Because skin is not sterile, your skin needs to be as free of germs as possible.  You can reduce the number of germs on your skin by washing with CHG (chlorahexidine gluconate) soap before surgery.  CHG is an antiseptic cleaner which kills germs and bonds with the skin to continue killing germs even after washing. Please DO NOT use if you have an allergy to CHG or antibacterial soaps.  If your skin becomes reddened/irritated stop using the CHG and inform your nurse when you arrive at Short Stay. Do not shave (including legs and underarms) for at least 48 hours prior to the first CHG shower.  You may shave your face/neck. Please follow these instructions carefully:  1.  Shower with CHG Soap the night before surgery and the  morning of Surgery.  2.  If you choose to wash your hair, wash your hair first as usual with your  normal  shampoo.  3.  After you shampoo, rinse your hair and body thoroughly to remove the  shampoo.                           4.  Use CHG as you would any other liquid soap.  You can apply chg directly  to the skin and wash  Gently with a scrungie or clean washcloth.  5.  Apply the CHG Soap to your body ONLY FROM THE NECK DOWN.   Do not use on face/ open                           Wound or open sores. Avoid  contact with eyes, ears mouth and genitals (private parts).                       Wash face,  Genitals (private parts) with your normal soap.             6.  Wash thoroughly, paying special attention to the area where your surgery  will be performed.  7.  Thoroughly rinse your body with warm water from the neck down.  8.  DO NOT shower/wash with your normal soap after using and rinsing off  the CHG Soap.                9.  Pat yourself dry with a clean towel.            10.  Wear clean pajamas.            11.  Place clean sheets on your bed the night of your first shower and do not  sleep with pets. Day of Surgery : Do not apply any lotions/deodorants the morning of surgery.  Please wear clean clothes to the hospital/surgery center.  FAILURE TO FOLLOW THESE INSTRUCTIONS MAY RESULT IN THE CANCELLATION OF YOUR SURGERY PATIENT SIGNATURE_________________________________  NURSE SIGNATURE__________________________________  ________________________________________________________________________   Russell Garrett  An incentive spirometer is a tool that can help keep your lungs clear and active. This tool measures how well you are filling your lungs with each breath. Taking long deep breaths may help reverse or decrease the chance of developing breathing (pulmonary) problems (especially infection) following:  A long period of time when you are unable to move or be active. BEFORE THE PROCEDURE   If the spirometer includes an indicator to show your best effort, your nurse or respiratory therapist will set it to a desired goal.  If possible, sit up straight or lean slightly forward. Try not to slouch.  Hold the incentive spirometer in an upright position. INSTRUCTIONS FOR USE  1. Sit on the edge of your bed if possible, or sit up as far as you can in bed or on a chair. 2. Hold the incentive spirometer in an upright position. 3. Breathe out normally. 4. Place the mouthpiece in your mouth  and seal your lips tightly around it. 5. Breathe in slowly and as deeply as possible, raising the piston or the ball toward the top of the column. 6. Hold your breath for 3-5 seconds or for as long as possible. Allow the piston or ball to fall to the bottom of the column. 7. Remove the mouthpiece from your mouth and breathe out normally. 8. Rest for a few seconds and repeat Steps 1 through 7 at least 10 times every 1-2 hours when you are awake. Take your time and take a few normal breaths between deep breaths. 9. The spirometer may include an indicator to show your best effort. Use the indicator as a goal to work toward during each repetition. 10. After each set of 10 deep breaths, practice coughing to be sure your lungs are clear. If you have an incision (the cut made at the time of  surgery), support your incision when coughing by placing a pillow or rolled up towels firmly against it. Once you are able to get out of bed, walk around indoors and cough well. You may stop using the incentive spirometer when instructed by your caregiver.  RISKS AND COMPLICATIONS  Take your time so you do not get dizzy or light-headed.  If you are in pain, you may need to take or ask for pain medication before doing incentive spirometry. It is harder to take a deep breath if you are having pain. AFTER USE  Rest and breathe slowly and easily.  It can be helpful to keep track of a log of your progress. Your caregiver can provide you with a simple table to help with this. If you are using the spirometer at home, follow these instructions: [place] IF:   You are having difficultly using the spirometer.  You have trouble using the spirometer as often as instructed.  Your pain medication is not giving enough relief while using the spirometer.  You develop fever of 100.5 F (38.1 C) or higher. SEEK IMMEDIATE MEDICAL CARE IF:   You cough up bloody sputum that had not been present before.  You develop  fever of 102 F (38.9 C) or greater.  You develop worsening pain at or near the incision site. MAKE SURE YOU:   Understand these instructions.  Will watch your condition.  Will get help right away if you are not doing well or get worse. Document Released: 03/23/2007 Document Revised: 02/02/2012 Document Reviewed: 05/24/2007 Exit[employer] Patient Information 2014 [employer], [place].   ________________________________________________________________________  WHAT IS A BLOOD TRANSFUSION? Blood Transfusion Information  A transfusion is the replacement of blood or some of its parts. Blood is made up of multiple cells which provide different functions.  Red blood cells carry oxygen and are used for blood loss replacement.  White blood cells fight against infection.  Platelets control bleeding.  Plasma helps clot blood.  Other blood products are available for specialized needs, such as hemophilia or other clotting disorders. BEFORE THE TRANSFUSION  Who gives blood for transfusions?   Healthy volunteers who are fully evaluated to make sure their blood is safe. This is blood bank blood. Transfusion therapy is the safest it has ever been in the practice of medicine. Before blood is taken from a donor, a complete history is taken to make sure that person has no history of diseases nor engages in risky social behavior (examples are intravenous drug use or sexual activity with multiple partners). The donor's travel history is screened to minimize risk of transmitting infections, such as malaria. The donated blood is tested for signs of infectious diseases, such as HIV and hepatitis. The blood is then tested to be sure it is compatible with you in order to minimize the chance of a transfusion reaction. If you or a relative donates blood, this is often done in anticipation of surgery and is not appropriate for emergency situations. It takes many days to process the donated blood. RISKS AND  COMPLICATIONS Although transfusion therapy is very safe and saves many lives, the main dangers of transfusion include:   Getting an infectious disease.  Developing a transfusion reaction. This is an allergic reaction to something in the blood you were given. Every precaution is taken to prevent this. The decision to have a blood transfusion has been considered carefully by your caregiver before blood is given. Blood is not given unless the benefits outweigh the risks. AFTER THE  TRANSFUSION  Right after receiving a blood transfusion, you will usually feel much better and more energetic. This is especially true if your red blood cells have gotten low (anemic). The transfusion raises the level of the red blood cells which carry oxygen, and this usually causes an energy increase.  The nurse administering the transfusion will monitor you carefully for complications. HOME CARE INSTRUCTIONS  No special instructions are needed after a transfusion. You may find your energy is better. Speak with your caregiver about any limitations on activity for underlying diseases you may have. SEEK MEDICAL CARE IF:   Your condition is not improving after your transfusion.  You develop redness or irritation at the intravenous (IV) site. SEEK IMMEDIATE MEDICAL CARE IF:  Any of the following symptoms occur over the next 12 hours:  Shaking chills.  You have a temperature by mouth above 102 F (38.9 C), not controlled by medicine.  Chest, back, or muscle pain.  People around you feel you are not acting correctly or are confused.  Shortness of breath or difficulty breathing.  Dizziness and fainting.  You get a rash or develop hives.  You have a decrease in urine output.  Your urine turns a dark color or changes to pink, red, or brown. Any of the following symptoms occur over the next 10 days:  You have a temperature by mouth above 102 F (38.9 C), not controlled by medicine.  Shortness of  breath.  Weakness after normal activity.  The white part of the eye turns yellow (jaundice).  You have a decrease in the amount of urine or are urinating less often.  Your urine turns a dark color or changes to pink, red, or brown. Document Released: 11/07/2000 Document Revised: 02/02/2012 Document Reviewed: 06/26/2008 [facility] Patient Information 2014 [place], [place].  _______________________________________________________________________

## 2018-09-16 ENCOUNTER — Telehealth: Payer: Self-pay | Admitting: Internal Medicine

## 2018-09-16 MED ORDER — LOSARTAN POTASSIUM-HCTZ 100-25 MG PO TABS: 0.5000 | ORAL_TABLET | Freq: Every day | ORAL | 3 refills | Status: DC

## 2018-09-16 NOTE — Telephone Encounter (Signed)
Pt went to pharmacy and they stated BP med is on backorder. He's requesting it to be sent to [facility] instead.

## 2018-09-16 NOTE — Telephone Encounter (Signed)
Rx sent electronically.  

## 2018-09-17 ENCOUNTER — Other Ambulatory Visit: Payer: Self-pay

## 2018-09-17 ENCOUNTER — Encounter (HOSPITAL_COMMUNITY)
Admission: RE | Admit: 2018-09-17 | Discharge: 2018-09-17 | Disposition: A | Discharge status: Home / Self Care | Payer: PPO | Source: Ambulatory Visit | Attending: Orthopedic Surgery | Admitting: Orthopedic Surgery

## 2018-09-17 ENCOUNTER — Encounter (HOSPITAL_COMMUNITY): Payer: Self-pay

## 2018-09-17 DIAGNOSIS — M1611 Unilateral primary osteoarthritis, right hip: Secondary | ICD-10-CM | POA: Insufficient documentation

## 2018-09-17 DIAGNOSIS — Z01812 Encounter for preprocedural laboratory examination: Secondary | ICD-10-CM | POA: Diagnosis not present

## 2018-09-17 HISTORY — DX: Thoracic aortic ectasia: I77.810

## 2018-09-17 HISTORY — DX: Air embolism (traumatic), initial encounter: T79.0XXA

## 2018-09-17 LAB — COMPREHENSIVE METABOLIC PANEL
ALBUMIN: 4.3 g/dL (ref 3.5–5.0)
ALT: 31 U/L (ref 0–44)
ANION GAP: 8 (ref 5–15)
AST: 28 U/L (ref 15–41)
Alkaline Phosphatase: 76 U/L (ref 38–126)
BUN: 23 mg/dL (ref 8–23)
CHLORIDE: 106 mmol/L (ref 98–111)
CO2: 28 mmol/L (ref 22–32)
Calcium: 8.9 mg/dL (ref 8.9–10.3)
Creatinine, Ser: 1.27 mg/dL — ABNORMAL HIGH (ref 0.61–1.24)
GFR calc non Af Amer: 57 mL/min — ABNORMAL LOW (ref >60)
GLUCOSE: 92 mg/dL (ref 70–99)
Potassium: 4.1 mmol/L (ref 3.5–5.1)
SODIUM: 142 mmol/L (ref 135–145)
Total Bilirubin: 1.1 mg/dL (ref 0.3–1.2)
Total Protein: 7.8 g/dL (ref 6.5–8.1)

## 2018-09-17 LAB — CBC
HCT: 45.5 % (ref 39.0–52.0)
HEMOGLOBIN: 14.8 g/dL (ref 13.0–17.0)
MCH: 30.1 pg (ref 26.0–34.0)
MCHC: 32.5 g/dL (ref 30.0–36.0)
MCV: 92.5 fL (ref 80.0–100.0)
PLATELETS: 176 10*3/uL (ref 150–400)
RBC: 4.92 MIL/uL (ref 4.22–5.81)
RDW: 12.6 % (ref 11.5–15.5)
WBC: 6.2 10*3/uL (ref 4.0–10.5)
nRBC: 0 % (ref 0.0–0.2)

## 2018-09-17 LAB — PROTIME-INR
INR: 0.97
Prothrombin Time: 12.8 seconds (ref 11.4–15.2)

## 2018-09-17 LAB — APTT: APTT: 29 s (ref 24–36)

## 2018-09-17 LAB — SURGICAL PCR SCREEN
MRSA, PCR: NEGATIVE
STAPHYLOCOCCUS AUREUS: NEGATIVE

## 2018-09-18 LAB — ABO/RH: ABO/RH(D): A POS

## 2018-09-20 ENCOUNTER — Encounter (HOSPITAL_COMMUNITY): Payer: Self-pay

## 2018-09-23 NOTE — H&P (Signed)
TOTAL HIP ADMISSION H&P  Patient is admitted for right total hip arthroplasty.  Subjective:  Chief Complaint: right hip pain  HPI: Russell Garrett, 68 y.o. male, has a history of pain and functional disability in the right hip(s) due to arthritis and patient has failed non-surgical conservative treatments for greater than 12 weeks to include NSAID's and/or analgesics, corticosteriod injections, flexibility and strengthening excercises, use of assistive devices and activity modification.  Onset of symptoms was gradual starting 2 years ago with gradually worsening course since that time.The patient noted no past surgery on the right hip(s).  Patient currently rates pain in the right hip at 5 out of 10 with activity. Patient has night pain, worsening of pain with activity and weight bearing, pain that interfers with activities of daily living and pain with passive range of motion. Patient has evidence of subchondral cysts, periarticular osteophytes and joint space narrowing by imaging studies. This condition presents safety issues increasing the risk of falls.  There is no current active infection.  Patient Active Problem List   Diagnosis Date Noted  . Preoperative evaluation to rule out surgical contraindication 07/21/2018  . Severe obesity (BMI 35.0-39.9) with comorbidity ([place]) 03/19/2018  . Advance directive discussed with patient 03/19/2018  . 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 Thoracic aortic aneurysm without rupture ([place]) 03/04/2018  . Aortic atherosclerosis ([place]) 03/04/2018  . Right hip OA, Advanced 03/24/2017  . Right knee pain 03/17/2017  . Routine general medical examination at a health care facility 02/16/2012  . OBSTRUCTIVE SLEEP APNEA 08/01/2008  . Hyperlipemia 03/09/2007  . Essential hypertension, benign 03/09/2007  . DIVERTICULOSIS, COLON 03/09/2007   Past Medical History:  Diagnosis Date  . Arthritis    r hip  . 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 Diverticulitis    pt denies  . Hyperlipidemia   . Hypertension   . 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 OSA (obstructive sleep apnea)   . Pulmonary air embolism ([place]) 02/2018   lifetime eliquis since then   . Sleep apnea    CPAP    Past Surgical History:  Procedure Laterality Date  . BACK SURGERY    . COLONOSCOPY    . THORACENTESIS  08/2006   left pleural effusion       Current Outpatient Medications  Medication Sig Dispense Refill Last Dose  . apixaban (ELIQUIS) 5 MG TABS tablet Take 1 tablet (5 mg total) by mouth 2 (two) times daily. 60 tablet 11 Taking  . atorvastatin (LIPITOR) 40 MG tablet Take 1 tablet (40 mg total) by mouth daily. 30 tablet 11 Taking  . naproxen sodium (ANAPROX) 220 MG tablet Take 220-440 mg by mouth daily as needed (for pain or headache).    Taking  . losartan-hydrochlorothiazide (HYZAAR) 100-25 MG tablet Take 0.5 tablets by mouth daily. 45 tablet 3    No Known Allergies  Social History   Tobacco Use  . Smoking status: Former Smoker    Packs/day: 1.00    Years: 35.00    Pack years: 35.00    Types: Cigarettes    Last attempt to quit: 12/16/2015    Years since quitting: 2.7  . Smokeless tobacco: Never Used  Substance Use Topics  . Alcohol use: Yes    Alcohol/week: 0.0 standard drinks    Comment: occ    Family History  Problem  Relation Age of Onset  . Hypertension Mother   . Heart disease Mother   . Kidney disease Mother   . Heart disease Father   . Stroke Father   . Prostate cancer Father   . Kidney disease Father   . Heart disease Brother        cardioversion for arrhythmia  . Diabetes Neg Hx   . Colon cancer Neg Hx      Review of Systems  Constitutional: Negative.   HENT: Negative.   Eyes: Negative.   Respiratory: Negative.   Cardiovascular: Negative.   Gastrointestinal: Negative.   Genitourinary: Negative.   Musculoskeletal: Positive  for joint pain and myalgias. Negative for back pain, falls and neck pain.  Skin: Negative.   Neurological: Negative.   Endo/Heme/Allergies: Negative.   Psychiatric/Behavioral: Negative.     Objective:  Physical Exam  Constitutional: He is oriented to person, place, and time. He appears well-developed. No distress.  Morbidly obese   HENT:  Head: Normocephalic and atraumatic.  Right Ear: External ear normal.  Left Ear: External ear normal.  Nose: Nose normal.  Mouth/Throat: Oropharynx is clear and moist.  Eyes: Conjunctivae and EOM are normal.  Neck: Normal range of motion. Neck supple.  Cardiovascular: Normal rate, regular rhythm, normal heart sounds and intact distal pulses.  No murmur heard. Respiratory: Effort normal and breath sounds normal. No respiratory distress. He has no wheezes.  GI: Soft. Bowel sounds are normal. He exhibits no distension. There is no tenderness.  Musculoskeletal:  Right Hip Exam: ROM: Flexion to 100, Internal Rotation 0, External Rotation 10, and Abduction 10-20 with pain. There is no tenderness over the greater trochanter bursa. There is no pain on provocative testing of the hip.  Left Hip Exam: ROM: Normal without discomfort. There is no tenderness over the greater trochanter bursa. There is no pain on provocative testing of the hip.  Neurological: He is alert and oriented to person, place, and time. He has normal strength and normal reflexes. No sensory deficit.  Skin: No rash noted. He is not diaphoretic. No erythema.  Psychiatric: He has a normal mood and affect. His behavior is normal.    Ht: 6 ft 2 in  Wt: 287 lbs  BMI: 36.8  BP: 126/74  Pulse: 76 bpm  Pain Scale: 5   Imaging Review Plain radiographs demonstrate severe degenerative joint disease of the right hip(s). The bone quality appears to be good for age and reported activity level.    Preoperative templating of the joint replacement has been completed, documented, and  submitted to the Operating Room personnel in order to optimize intra-operative equipment management.     Assessment/Plan:  End stage primary osteoarthritis, right hip(s)  The patient history, physical examination, clinical judgement of the provider and imaging studies are consistent with end stage degenerative joint disease of the right hip(s) and total hip arthroplasty is deemed medically necessary. The treatment options including medical management, injection therapy, arthroscopy and arthroplasty were discussed at length. The risks and benefits of total hip arthroplasty were presented and reviewed. The risks due to aseptic loosening, infection, stiffness, dislocation/subluxation,  thromboembolic complications and other imponderables were discussed.  The patient acknowledged the explanation, agreed to proceed with the plan and consent was signed. Patient is being admitted for inpatient treatment for surgery, pain control, PT, OT, prophylactic antibiotics, VTE prophylaxis, progressive ambulation and ADL's and discharge planning.The patient is planning to be discharged home with home health services vs HEP.  Therapy Plans: HHPT vs  HEP Disposition: Home with wife DME needed: none needed PCP: Dr. Silvio Pate- preop clearance received Last dose of Eliquis 11/1 Other: no anesthesia concerns   Ardeen Jourdain, PA-C

## 2018-09-24 DIAGNOSIS — G4733 Obstructive sleep apnea (adult) (pediatric): Secondary | ICD-10-CM | POA: Diagnosis not present

## 2018-09-26 MED ORDER — TRANEXAMIC ACID 1000 MG/10ML IV SOLN
2000.0000 mg | INTRAVENOUS | Status: DC
  Filled 2018-09-26 (×2): qty 20

## 2018-09-26 MED ORDER — DEXTROSE 5 % IV SOLN
3.0000 g | INTRAVENOUS | Status: AC
  Administered 2018-09-27: 3 g via INTRAVENOUS
  Filled 2018-09-26: qty 3

## 2018-09-27 ENCOUNTER — Encounter (HOSPITAL_COMMUNITY): Payer: Self-pay | Admitting: *Deleted

## 2018-09-27 ENCOUNTER — Inpatient Hospital Stay (HOSPITAL_COMMUNITY): Payer: PPO

## 2018-09-27 ENCOUNTER — Inpatient Hospital Stay (HOSPITAL_COMMUNITY)
Admission: RE | Admit: 2018-09-27 | Discharge: 2018-09-28 | DRG: 470 | Disposition: A | Discharge status: Home / Self Care | Payer: PPO | Attending: Orthopedic Surgery | Admitting: Orthopedic Surgery

## 2018-09-27 ENCOUNTER — Other Ambulatory Visit: Payer: Self-pay

## 2018-09-27 ENCOUNTER — Encounter (HOSPITAL_COMMUNITY)
Admission: RE | Disposition: A | Discharge status: Home / Self Care | Payer: Self-pay | Source: Home / Self Care | Attending: Orthopedic Surgery

## 2018-09-27 ENCOUNTER — Inpatient Hospital Stay (HOSPITAL_COMMUNITY): Payer: PPO | Admitting: Certified Registered Nurse Anesthetist

## 2018-09-27 DIAGNOSIS — M169 Osteoarthritis of hip, unspecified: Secondary | ICD-10-CM | POA: Diagnosis not present

## 2018-09-27 DIAGNOSIS — M1611 Unilateral primary osteoarthritis, right hip: Principal | ICD-10-CM | POA: Diagnosis present

## 2018-09-27 DIAGNOSIS — I712 Thoracic aortic aneurysm, without rupture: Secondary | ICD-10-CM | POA: Diagnosis present

## 2018-09-27 DIAGNOSIS — Z8042 Family history of malignant neoplasm of prostate: Secondary | ICD-10-CM

## 2018-09-27 DIAGNOSIS — Z96649 Presence of unspecified artificial hip joint: Secondary | ICD-10-CM

## 2018-09-27 DIAGNOSIS — Z87891 Personal history of nicotine dependence: Secondary | ICD-10-CM

## 2018-09-27 DIAGNOSIS — Z79899 Other long term (current) drug therapy: Secondary | ICD-10-CM | POA: Diagnosis not present

## 2018-09-27 DIAGNOSIS — Z471 Aftercare following joint replacement surgery: Secondary | ICD-10-CM | POA: Diagnosis not present

## 2018-09-27 DIAGNOSIS — Z8249 Family history of ischemic heart disease and other diseases of the circulatory system: Secondary | ICD-10-CM | POA: Diagnosis not present

## 2018-09-27 DIAGNOSIS — Z86711 Personal history of pulmonary embolism: Secondary | ICD-10-CM | POA: Diagnosis not present

## 2018-09-27 DIAGNOSIS — G4733 Obstructive sleep apnea (adult) (pediatric): Secondary | ICD-10-CM | POA: Diagnosis not present

## 2018-09-27 DIAGNOSIS — Z9989 Dependence on other enabling machines and devices: Secondary | ICD-10-CM

## 2018-09-27 DIAGNOSIS — Z79891 Long term (current) use of opiate analgesic: Secondary | ICD-10-CM

## 2018-09-27 DIAGNOSIS — M25751 Osteophyte, right hip: Secondary | ICD-10-CM | POA: Diagnosis present

## 2018-09-27 DIAGNOSIS — I10 Essential (primary) hypertension: Secondary | ICD-10-CM | POA: Diagnosis not present

## 2018-09-27 DIAGNOSIS — M47815 Spondylosis without myelopathy or radiculopathy, thoracolumbar region: Secondary | ICD-10-CM | POA: Diagnosis present

## 2018-09-27 DIAGNOSIS — E785 Hyperlipidemia, unspecified: Secondary | ICD-10-CM | POA: Diagnosis present

## 2018-09-27 DIAGNOSIS — Z823 Family history of stroke: Secondary | ICD-10-CM | POA: Diagnosis not present

## 2018-09-27 DIAGNOSIS — Z841 Family history of disorders of kidney and ureter: Secondary | ICD-10-CM

## 2018-09-27 DIAGNOSIS — R269 Unspecified abnormalities of gait and mobility: Secondary | ICD-10-CM | POA: Diagnosis not present

## 2018-09-27 DIAGNOSIS — Z7901 Long term (current) use of anticoagulants: Secondary | ICD-10-CM

## 2018-09-27 DIAGNOSIS — Z6836 Body mass index (BMI) 36.0-36.9, adult: Secondary | ICD-10-CM | POA: Diagnosis not present

## 2018-09-27 DIAGNOSIS — Z9181 History of falling: Secondary | ICD-10-CM | POA: Diagnosis not present

## 2018-09-27 DIAGNOSIS — M479 Spondylosis, unspecified: Secondary | ICD-10-CM | POA: Diagnosis present

## 2018-09-27 DIAGNOSIS — Z96641 Presence of right artificial hip joint: Secondary | ICD-10-CM | POA: Diagnosis not present

## 2018-09-27 HISTORY — PX: TOTAL HIP ARTHROPLASTY: SHX124

## 2018-09-27 LAB — TYPE AND SCREEN
ABO/RH(D): A POS
Antibody Screen: NEGATIVE

## 2018-09-27 SURGERY — ARTHROPLASTY, HIP, TOTAL, ANTERIOR APPROACH
Anesthesia: Spinal | Site: Hip | Laterality: Right

## 2018-09-27 MED ORDER — ONDANSETRON HCL 4 MG/2ML IJ SOLN
INTRAMUSCULAR | Status: DC | PRN
  Administered 2018-09-27: 4 mg via INTRAVENOUS

## 2018-09-27 MED ORDER — STERILE WATER FOR IRRIGATION IR SOLN
Status: DC | PRN
  Administered 2018-09-27: 2000 mL

## 2018-09-27 MED ORDER — ONDANSETRON HCL 4 MG PO TABS: 4.0000 mg | ORAL_TABLET | Freq: Four times a day (QID) | ORAL | Status: DC | PRN

## 2018-09-27 MED ORDER — BISACODYL 10 MG RE SUPP: 10.0000 mg | Freq: Every day | RECTAL | Status: DC | PRN

## 2018-09-27 MED ORDER — FENTANYL CITRATE (PF) 100 MCG/2ML IJ SOLN: 25.0000 ug | INTRAMUSCULAR | Status: DC | PRN

## 2018-09-27 MED ORDER — HYDROCHLOROTHIAZIDE 12.5 MG PO CAPS
12.5000 mg | ORAL_CAPSULE | Freq: Every day | ORAL | Status: DC
  Administered 2018-09-28: 12.5 mg via ORAL
  Filled 2018-09-27: qty 1

## 2018-09-27 MED ORDER — LOSARTAN POTASSIUM 50 MG PO TABS
50.0000 mg | ORAL_TABLET | Freq: Every day | ORAL | Status: DC
  Administered 2018-09-28: 50 mg via ORAL
  Filled 2018-09-27: qty 1

## 2018-09-27 MED ORDER — ONDANSETRON HCL 4 MG/2ML IJ SOLN: 4.0000 mg | Freq: Four times a day (QID) | INTRAMUSCULAR | Status: DC | PRN

## 2018-09-27 MED ORDER — PHENYLEPHRINE 40 MCG/ML (10ML) SYRINGE FOR IV PUSH (FOR BLOOD PRESSURE SUPPORT)
PREFILLED_SYRINGE | INTRAVENOUS | Status: DC | PRN
  Administered 2018-09-27 (×5): 80 ug via INTRAVENOUS

## 2018-09-27 MED ORDER — BUPIVACAINE IN DEXTROSE 0.75-8.25 % IT SOLN
INTRATHECAL | Status: DC | PRN
  Administered 2018-09-27: 2 mL via INTRATHECAL

## 2018-09-27 MED ORDER — TRAMADOL HCL 50 MG PO TABS: 50.0000 mg | ORAL_TABLET | Freq: Four times a day (QID) | ORAL | Status: DC | PRN

## 2018-09-27 MED ORDER — MIDAZOLAM HCL 5 MG/5ML IJ SOLN
INTRAMUSCULAR | Status: DC | PRN
  Administered 2018-09-27: 2 mg via INTRAVENOUS

## 2018-09-27 MED ORDER — PHENYLEPHRINE 40 MCG/ML (10ML) SYRINGE FOR IV PUSH (FOR BLOOD PRESSURE SUPPORT)
PREFILLED_SYRINGE | INTRAVENOUS | Status: AC
  Filled 2018-09-27: qty 10

## 2018-09-27 MED ORDER — HYDROCODONE-ACETAMINOPHEN 7.5-325 MG PO TABS: 1.0000 | ORAL_TABLET | ORAL | Status: DC | PRN

## 2018-09-27 MED ORDER — TRANEXAMIC ACID 1000 MG/10ML IV SOLN: 2000.0000 mg | Freq: Once | INTRAVENOUS | Status: DC

## 2018-09-27 MED ORDER — DEXAMETHASONE SODIUM PHOSPHATE 10 MG/ML IJ SOLN
10.0000 mg | Freq: Once | INTRAMUSCULAR | Status: AC
  Administered 2018-09-28: 10 mg via INTRAVENOUS
  Filled 2018-09-27: qty 1

## 2018-09-27 MED ORDER — FLEET ENEMA 7-19 GM/118ML RE ENEM: 1.0000 | ENEMA | Freq: Once | RECTAL | Status: DC | PRN

## 2018-09-27 MED ORDER — SODIUM CHLORIDE 0.9 % IV SOLN
INTRAVENOUS | Status: DC | PRN
  Administered 2018-09-27: 50 ug/min via INTRAVENOUS

## 2018-09-27 MED ORDER — METOCLOPRAMIDE HCL 5 MG/ML IJ SOLN: 5.0000 mg | Freq: Three times a day (TID) | INTRAMUSCULAR | Status: DC | PRN

## 2018-09-27 MED ORDER — PROPOFOL 10 MG/ML IV BOLUS
INTRAVENOUS | Status: AC
  Filled 2018-09-27: qty 20

## 2018-09-27 MED ORDER — METHOCARBAMOL 500 MG PO TABS: 500.0000 mg | ORAL_TABLET | Freq: Four times a day (QID) | ORAL | Status: DC | PRN

## 2018-09-27 MED ORDER — ACETAMINOPHEN 500 MG PO TABS
500.0000 mg | ORAL_TABLET | Freq: Four times a day (QID) | ORAL | Status: DC
  Administered 2018-09-27 – 2018-09-28 (×3): 500 mg via ORAL
  Filled 2018-09-27 (×3): qty 1

## 2018-09-27 MED ORDER — TRANEXAMIC ACID 1000 MG/10ML IV SOLN
INTRAVENOUS | Status: DC | PRN
  Administered 2018-09-27: 2000 mg via TOPICAL

## 2018-09-27 MED ORDER — PROPOFOL 10 MG/ML IV BOLUS
INTRAVENOUS | Status: AC
  Filled 2018-09-27: qty 40

## 2018-09-27 MED ORDER — HYDROCODONE-ACETAMINOPHEN 5-325 MG PO TABS: 1.0000 | ORAL_TABLET | ORAL | Status: DC | PRN

## 2018-09-27 MED ORDER — TRANEXAMIC ACID-NACL 1000-0.7 MG/100ML-% IV SOLN
1000.0000 mg | INTRAVENOUS | Status: DC
  Filled 2018-09-27: qty 100

## 2018-09-27 MED ORDER — ONDANSETRON HCL 4 MG/2ML IJ SOLN
INTRAMUSCULAR | Status: AC
  Filled 2018-09-27: qty 2

## 2018-09-27 MED ORDER — 0.9 % SODIUM CHLORIDE (POUR BTL) OPTIME
TOPICAL | Status: DC | PRN
  Administered 2018-09-27: 1000 mL

## 2018-09-27 MED ORDER — SODIUM CHLORIDE 0.9 % IV SOLN
INTRAVENOUS | Status: DC
  Administered 2018-09-27: 22:00:00 via INTRAVENOUS

## 2018-09-27 MED ORDER — APIXABAN 2.5 MG PO TABS
2.5000 mg | ORAL_TABLET | Freq: Two times a day (BID) | ORAL | Status: DC
  Administered 2018-09-28: 2.5 mg via ORAL
  Filled 2018-09-27: qty 1

## 2018-09-27 MED ORDER — DIPHENHYDRAMINE HCL 12.5 MG/5ML PO ELIX: 12.5000 mg | ORAL_SOLUTION | ORAL | Status: DC | PRN

## 2018-09-27 MED ORDER — MENTHOL 3 MG MT LOZG: 1.0000 | LOZENGE | OROMUCOSAL | Status: DC | PRN

## 2018-09-27 MED ORDER — LOSARTAN POTASSIUM-HCTZ 100-25 MG PO TABS: 0.5000 | ORAL_TABLET | Freq: Every day | ORAL | Status: DC

## 2018-09-27 MED ORDER — LACTATED RINGERS IV SOLN
INTRAVENOUS | Status: DC
  Administered 2018-09-27 (×2): via INTRAVENOUS

## 2018-09-27 MED ORDER — DEXAMETHASONE SODIUM PHOSPHATE 10 MG/ML IJ SOLN
8.0000 mg | Freq: Once | INTRAMUSCULAR | Status: AC
  Administered 2018-09-27: 10 mg via INTRAVENOUS

## 2018-09-27 MED ORDER — ACETAMINOPHEN 10 MG/ML IV SOLN
1000.0000 mg | Freq: Once | INTRAVENOUS | Status: AC
  Administered 2018-09-27: 1000 mg via INTRAVENOUS
  Filled 2018-09-27: qty 100

## 2018-09-27 MED ORDER — PROPOFOL 500 MG/50ML IV EMUL
INTRAVENOUS | Status: DC | PRN
  Administered 2018-09-27: 50 ug/kg/min via INTRAVENOUS

## 2018-09-27 MED ORDER — CEFAZOLIN SODIUM-DEXTROSE 2-4 GM/100ML-% IV SOLN
2.0000 g | Freq: Four times a day (QID) | INTRAVENOUS | Status: AC
  Administered 2018-09-27 – 2018-09-28 (×2): 2 g via INTRAVENOUS
  Filled 2018-09-27 (×2): qty 100

## 2018-09-27 MED ORDER — ATORVASTATIN CALCIUM 40 MG PO TABS
40.0000 mg | ORAL_TABLET | Freq: Every day | ORAL | Status: DC
  Administered 2018-09-28: 40 mg via ORAL
  Filled 2018-09-27: qty 1

## 2018-09-27 MED ORDER — MORPHINE SULFATE (PF) 2 MG/ML IV SOLN: 0.5000 mg | INTRAVENOUS | Status: DC | PRN

## 2018-09-27 MED ORDER — OXYCODONE HCL 5 MG/5ML PO SOLN
5.0000 mg | Freq: Once | ORAL | Status: DC | PRN
  Filled 2018-09-27: qty 5

## 2018-09-27 MED ORDER — DEXAMETHASONE SODIUM PHOSPHATE 10 MG/ML IJ SOLN
INTRAMUSCULAR | Status: AC
  Filled 2018-09-27: qty 1

## 2018-09-27 MED ORDER — PHENYLEPHRINE HCL 10 MG/ML IJ SOLN
INTRAMUSCULAR | Status: AC
  Filled 2018-09-27: qty 2

## 2018-09-27 MED ORDER — METHOCARBAMOL 500 MG IVPB - SIMPLE MED
500.0000 mg | Freq: Four times a day (QID) | INTRAVENOUS | Status: DC | PRN
  Filled 2018-09-27: qty 50

## 2018-09-27 MED ORDER — BUPIVACAINE HCL (PF) 0.25 % IJ SOLN
INTRAMUSCULAR | Status: DC | PRN
  Administered 2018-09-27: 30 mL

## 2018-09-27 MED ORDER — OXYCODONE HCL 5 MG PO TABS: 5.0000 mg | ORAL_TABLET | Freq: Once | ORAL | Status: DC | PRN

## 2018-09-27 MED ORDER — BUPIVACAINE HCL (PF) 0.25 % IJ SOLN
INTRAMUSCULAR | Status: AC
  Filled 2018-09-27: qty 30

## 2018-09-27 MED ORDER — PHENOL 1.4 % MT LIQD
1.0000 | OROMUCOSAL | Status: DC | PRN
  Filled 2018-09-27: qty 177

## 2018-09-27 MED ORDER — CHLORHEXIDINE GLUCONATE 4 % EX LIQD: 60.0000 mL | Freq: Once | CUTANEOUS | Status: DC

## 2018-09-27 MED ORDER — DOCUSATE SODIUM 100 MG PO CAPS
100.0000 mg | ORAL_CAPSULE | Freq: Two times a day (BID) | ORAL | Status: DC
  Administered 2018-09-27 – 2018-09-28 (×2): 100 mg via ORAL
  Filled 2018-09-27 (×2): qty 1

## 2018-09-27 MED ORDER — METOCLOPRAMIDE HCL 5 MG PO TABS: 5.0000 mg | ORAL_TABLET | Freq: Three times a day (TID) | ORAL | Status: DC | PRN

## 2018-09-27 MED ORDER — MIDAZOLAM HCL 2 MG/2ML IJ SOLN
INTRAMUSCULAR | Status: AC
  Filled 2018-09-27: qty 2

## 2018-09-27 MED ORDER — POLYETHYLENE GLYCOL 3350 17 G PO PACK: 17.0000 g | PACK | Freq: Every day | ORAL | Status: DC | PRN

## 2018-09-27 SURGICAL SUPPLY — 42 items
BAG DECANTER FOR FLEXI CONT (MISCELLANEOUS) ×3 IMPLANT
BAG SPEC THK2 15X12 ZIP CLS (MISCELLANEOUS)
BAG ZIPLOCK 12X15 (MISCELLANEOUS) IMPLANT
BLADE SAG 18X100X1.27 (BLADE) ×3 IMPLANT
CLOSURE WOUND 1/2 X4 (GAUZE/BANDAGES/DRESSINGS) ×1
COVER PERINEAL POST (MISCELLANEOUS) ×3 IMPLANT
COVER SURGICAL LIGHT HANDLE (MISCELLANEOUS) ×3 IMPLANT
COVER WAND RF STERILE (DRAPES) ×2 IMPLANT
CUP ACETBLR 54 OD PINNACLE (Hips) ×2 IMPLANT
DECANTER SPIKE VIAL GLASS SM (MISCELLANEOUS) ×3 IMPLANT
DRAPE STERI IOBAN 125X83 (DRAPES) ×3 IMPLANT
DRAPE U-SHAPE 47X51 STRL (DRAPES) ×6 IMPLANT
DRSG ADAPTIC 3X8 NADH LF (GAUZE/BANDAGES/DRESSINGS) ×3 IMPLANT
DRSG MEPILEX BORDER 4X4 (GAUZE/BANDAGES/DRESSINGS) ×3 IMPLANT
DRSG MEPILEX BORDER 4X8 (GAUZE/BANDAGES/DRESSINGS) ×3 IMPLANT
DURAPREP 26ML APPLICATOR (WOUND CARE) ×3 IMPLANT
ELECT REM PT RETURN 15FT ADLT (MISCELLANEOUS) ×3 IMPLANT
EVACUATOR 1/8 PVC DRAIN (DRAIN) ×3 IMPLANT
GLOVE BIO SURGEON STRL SZ7 (GLOVE) ×5 IMPLANT
GLOVE BIO SURGEON STRL SZ8 (GLOVE) ×3 IMPLANT
GLOVE BIOGEL PI IND STRL 7.0 (GLOVE) ×1 IMPLANT
GLOVE BIOGEL PI IND STRL 8 (GLOVE) ×1 IMPLANT
GLOVE BIOGEL PI INDICATOR 7.0 (GLOVE) ×4
GLOVE BIOGEL PI INDICATOR 8 (GLOVE) ×2
GOWN STRL REUS W/TWL LRG LVL3 (GOWN DISPOSABLE) ×3 IMPLANT
GOWN STRL REUS W/TWL XL LVL3 (GOWN DISPOSABLE) ×3 IMPLANT
HEAD CERAMIC 36 PLUS5 (Hips) ×2 IMPLANT
HOLDER FOLEY CATH W/STRAP (MISCELLANEOUS) ×3 IMPLANT
LINER MARATHON NEUT +4X54X36 (Hips) ×2 IMPLANT
MANIFOLD NEPTUNE II (INSTRUMENTS) ×3 IMPLANT
PACK ANTERIOR HIP CUSTOM (KITS) ×3 IMPLANT
STEM FEMORAL SZ5 HIGH ACTIS (Nail) ×2 IMPLANT
STRIP CLOSURE SKIN 1/2X4 (GAUZE/BANDAGES/DRESSINGS) ×2 IMPLANT
SUT ETHIBOND NAB CT1 #1 30IN (SUTURE) ×3 IMPLANT
SUT MNCRL AB 4-0 PS2 18 (SUTURE) ×3 IMPLANT
SUT STRATAFIX 0 PDS 27 VIOLET (SUTURE) ×6
SUT VIC AB 2-0 CT1 27 (SUTURE) ×6
SUT VIC AB 2-0 CT1 TAPERPNT 27 (SUTURE) ×2 IMPLANT
SUTURE STRATFX 0 PDS 27 VIOLET (SUTURE) ×1 IMPLANT
SYR 50ML LL SCALE MARK (SYRINGE) IMPLANT
TRAY FOLEY MTR SLVR 16FR STAT (SET/KITS/TRAYS/PACK) ×3 IMPLANT
YANKAUER SUCT BULB TIP 10FT TU (MISCELLANEOUS) ×3 IMPLANT

## 2018-09-27 NOTE — Op Note (Signed)
OPERATIVE REPORT- TOTAL HIP ARTHROPLASTY   PREOPERATIVE DIAGNOSIS: Osteoarthritis of the Right hip.   POSTOPERATIVE DIAGNOSIS: Osteoarthritis of the Right  hip.   PROCEDURE: Right total hip arthroplasty, anterior approach.   SURGEON: Gaynelle Arabian, MD   ASSISTANT: Theresa Duty, PA-C  ANESTHESIA:  Spinal  ESTIMATED BLOOD LOSS:-200 mL    DRAINS: Hemovac x1.   COMPLICATIONS: None   CONDITION: PACU - hemodynamically stable.   BRIEF CLINICAL NOTE: Russell Garrett is a 67 y.o. male who has advanced end-  stage arthritis of their Right  hip with progressively worsening pain and  dysfunction.The patient has failed nonoperative management and presents for  total hip arthroplasty.   PROCEDURE IN DETAIL: After successful administration of spinal  anesthetic, the traction boots for the [facility] bed were placed on both  feet and the patient was placed onto the [facility] bed, boots placed into the leg  holders. The Right hip was then isolated from the perineum with plastic  drapes and prepped and draped in the usual sterile fashion. ASIS and  greater trochanter were marked and a oblique incision was made, starting  at about 1 cm lateral and 2 cm distal to the ASIS and coursing towards  the anterior cortex of the femur. The skin was cut with a 10 blade  through subcutaneous tissue to the level of the fascia overlying the  tensor fascia lata muscle. The fascia was then incised in line with the  incision at the junction of the anterior third and posterior 2/3rd. The  muscle was teased off the fascia and then the interval between the TFL  and the rectus was developed. The Hohmann retractor was then placed at  the top of the femoral neck over the capsule. The vessels overlying the  capsule were cauterized and the fat on top of the capsule was removed.  A Hohmann retractor was then placed anterior underneath the rectus  femoris to give exposure to the entire anterior capsule. A T-shaped   capsulotomy was performed. The edges were tagged and the femoral head  was identified.       Osteophytes are removed off the superior acetabulum.  The femoral neck was then cut in situ with an oscillating saw. Traction  was then applied to the left lower extremity utilizing the [facility]  traction. The femoral head was then removed. Retractors were placed  around the acetabulum and then circumferential removal of the labrum was  performed. Osteophytes were also removed. Reaming starts at 49 mm to  medialize and  Increased in 2 mm increments to 53 mm. We reamed in  approximately 40 degrees of abduction, 20 degrees anteversion. A 54 mm  pinnacle acetabular shell was then impacted in anatomic position under  fluoroscopic guidance with excellent purchase. We did not need to place  any additional dome screws. A 36 mm neutral + 4 marathon liner was then  placed into the acetabular shell.       The femoral lift was then placed along the lateral aspect of the femur  just distal to the vastus ridge. The leg was  externally rotated and capsule  was stripped off the inferior aspect of the femoral neck down to the  level of the lesser trochanter, this was done with electrocautery. The femur was lifted after this was performed. The  leg was then placed in an extended and adducted position essentially delivering the femur. We also removed the capsule superiorly and the piriformis from the piriformis  fossa to gain excellent exposure of the  proximal femur. Rongeur was used to remove some cancellous bone to get  into the lateral portion of the proximal femur for placement of the  initial starter reamer. The starter broaches was placed  the starter broach  and was shown to go down the center of the canal. Broaching  with the Actis system was then performed starting at size 0  coursing  Up to size 5. A size 5 had excellent torsional and rotational  and axial stability. The trial high offset neck was then placed   with a 36 + 5 trial head. The hip was then reduced. We confirmed that  the stem was in the canal both on AP and lateral x-rays. It also has excellent sizing. The hip was reduced with outstanding stability through full extension and full external rotation.. AP pelvis was taken and the leg lengths were measured and found to be equal. Hip was then dislocated again and the femoral head and neck removed. The  femoral broach was removed. Size 5 Actis stem with a high offset  neck was then impacted into the femur following native anteversion. Has  excellent purchase in the canal. Excellent torsional and rotational and  axial stability. It is confirmed to be in the canal on AP and lateral  fluoroscopic views. The 36 + 5 ceramic head was placed and the hip  reduced with outstanding stability. Again AP pelvis was taken and it  confirmed that the leg lengths were equal. The wound was then copiously  irrigated with saline solution and the capsule reattached and repaired  with Ethibond suture. 30 ml of .25% Bupivicaine was  injected into the capsule and into the edge of the tensor fascia lata as well as subcutaneous tissue. The fascia overlying the tensor fascia lata was then closed with a running #1 V-Loc. Subcu was closed with interrupted 2-0 Vicryl and subcuticular running 4-0 Monocryl. Incision was cleaned  and dried. Steri-Strips and a bulky sterile dressing applied. Hemovac  drain was hooked to suction and then the patient was awakened and transported to  recovery in stable condition.        Please note that a surgical assistant was a medical necessity for this procedure to perform it in a safe and expeditious manner. Assistant was necessary to provide appropriate retraction of vital neurovascular structures and to prevent femoral fracture and allow for anatomic placement of the prosthesis.  Gaynelle Arabian, M.D.

## 2018-09-27 NOTE — Progress Notes (Signed)
Per [place] assistant with Dr Wynelle Link. Discontinue order for TXA IV.

## 2018-09-27 NOTE — Interval H&P Note (Signed)
History and Physical Interval Note:  09/27/2018 1:23 PM  Russell Garrett  has presented today for surgery, with the diagnosis of right hip osteoarthritis  The various methods of treatment have been discussed with the patient and family. After consideration of risks, benefits and other options for treatment, the patient has consented to  Procedure(s) with comments: [name] (Right) - 153min as a surgical intervention .  The patient's history has been reviewed, patient examined, no change in status, stable for surgery.  I have reviewed the patient's chart and labs.  Questions were answered to the patient's satisfaction.     [name]

## 2018-09-27 NOTE — Anesthesia Procedure Notes (Signed)
Spinal  Patient location during procedure: OR Start time: 09/27/2018 3:35 PM End time: 09/27/2018 3:40 PM Staffing Anesthesiologist: Albertha Ghee, MD Performed: anesthesiologist  Preanesthetic Checklist Completed: patient identified, surgical consent, pre-op evaluation, timeout performed, IV checked, risks and benefits discussed and monitors and equipment checked Spinal Block Patient position: sitting Prep: DuraPrep Patient monitoring: cardiac monitor, continuous pulse ox and blood pressure Approach: midline Location: L3-4 Injection technique: single-shot Needle Needle type: Pencan  Needle gauge: 24 G Needle length: 9 cm Assessment Sensory level: T10 Additional Notes Functioning IV was confirmed and monitors were applied. Sterile prep and drape, including hand hygiene and sterile gloves were used. The patient was positioned and the spine was prepped. The skin was anesthetized with lidocaine.  Free flow of clear CSF was obtained prior to injecting local anesthetic into the CSF.  The spinal needle aspirated freely following injection.  The needle was carefully withdrawn.  The patient tolerated the procedure well.

## 2018-09-27 NOTE — Transfer of Care (Signed)
Immediate Anesthesia Transfer of Care Note  Patient: Russell Garrett  Procedure(s) Performed: RIGHT TOTAL HIP ARTHROPLASTY ANTERIOR APPROACH (Right Hip)  Patient Location: PACU  Anesthesia Type:MAC and Spinal  Level of Consciousness: awake, alert , oriented and patient cooperative  Airway & Oxygen Therapy: Patient Spontanous Breathing and Patient connected to face mask oxygen  Post-op Assessment: Report given to RN and Post -op Vital signs reviewed and stable  Post vital signs: Reviewed and stable  Last Vitals:  Vitals Value Taken Time  BP 127/66 09/27/2018  5:33 PM  Temp    Pulse 84 09/27/2018  5:37 PM  Resp 21 09/27/2018  5:37 PM  SpO2 100 % 09/27/2018  5:37 PM  Vitals shown include unvalidated device data.  Last Pain:  Vitals:   09/27/18 1252  TempSrc: Oral  PainSc: 3       Patients Stated Pain Goal: 6 (21/97/58 8325)  Complications: No anesthetic complications

## 2018-09-27 NOTE — Anesthesia Preprocedure Evaluation (Signed)
Anesthesia Evaluation  Patient identified by MRN, date of birth, ID band Patient awake    Reviewed: Allergy & Precautions, H&P , NPO status , Patient's Chart, lab work & pertinent test results  Airway Mallampati: II   Neck ROM: full    Dental   Pulmonary sleep apnea , former smoker,    breath sounds clear to auscultation       Cardiovascular hypertension, + DVT   Rhythm:regular Rate:Normal  Last dose of eliquis was 10/31.   Neuro/Psych    GI/Hepatic   Endo/Other    Renal/GU      Musculoskeletal  (+) Arthritis ,   Abdominal   Peds  Hematology   Anesthesia Other Findings   Reproductive/Obstetrics                             Anesthesia Physical Anesthesia Plan  ASA: III  Anesthesia Plan: Spinal   Post-op Pain Management:    Induction: Intravenous  PONV Risk Score and Plan: 1 and Ondansetron, Dexamethasone, Propofol infusion and Treatment may vary due to age or medical condition  Airway Management Planned: Simple Face Mask  Additional Equipment:   Intra-op Plan:   Post-operative Plan:   Informed Consent: I have reviewed the patients History and Physical, chart, labs and discussed the procedure including the risks, benefits and alternatives for the proposed anesthesia with the patient or authorized representative who has indicated his/her understanding and acceptance.     Plan Discussed with: CRNA, Anesthesiologist and Surgeon  Anesthesia Plan Comments:         Anesthesia Quick Evaluation

## 2018-09-27 NOTE — Anesthesia Procedure Notes (Signed)
Procedure Name: MAC Date/Time: 09/27/2018 3:31 PM Performed by: West Pugh, CRNA Pre-anesthesia Checklist: Patient identified, Emergency Drugs available, Suction available, Patient being monitored and Timeout performed Patient Re-evaluated:Patient Re-evaluated prior to induction Oxygen Delivery Method: Simple face mask Preoxygenation: Pre-oxygenation with 100% oxygen Induction Type: IV induction Placement Confirmation: positive ETCO2 Dental Injury: Teeth and Oropharynx as per pre-operative assessment

## 2018-09-27 NOTE — Discharge Instructions (Signed)
°Dr. Frank Aluisio °Total Joint Specialist °[facility] °[address] °[place], [place] [address] °[phone] ° °ANTERIOR APPROACH TOTAL HIP REPLACEMENT POSTOPERATIVE DIRECTIONS ° ° °Hip Rehabilitation, Guidelines Following Surgery  °The results of a hip operation are greatly improved after range of motion and muscle strengthening exercises. Follow all safety measures which are given to protect your hip. If any of these exercises cause increased pain or swelling in your joint, decrease the amount until you are comfortable again. Then slowly increase the exercises. Call your caregiver if you have problems or questions.  ° °HOME CARE INSTRUCTIONS  °• Remove items at home which could result in a fall. This includes throw rugs or furniture in walking pathways.  °· ICE to the affected hip every three hours for 30 minutes at a time and then as needed for pain and swelling.  Continue to use ice on the hip for pain and swelling from surgery. You may notice swelling that will progress down to the foot and ankle.  This is normal after surgery.  Elevate the leg when you are not up walking on it.   °· Continue to use the breathing machine which will help keep your temperature down.  It is common for your temperature to cycle up and down following surgery, especially at night when you are not up moving around and exerting yourself.  The breathing machine keeps your lungs expanded and your temperature down. ° °DIET °You may resume your previous home diet once your are discharged from the hospital. ° °DRESSING / WOUND CARE / SHOWERING °You may shower 3 days after surgery, but keep the wounds dry during showering.  You may use an occlusive plastic wrap (Press'n Seal for example), NO SOAKING/SUBMERGING IN THE BATHTUB.  If the bandage gets wet, change with a clean dry gauze.  If the incision gets wet, pat the wound dry with a clean towel. °You may start showering once you are discharged home but do not submerge the  incision under water. Just pat the incision dry and apply a dry gauze dressing on daily. °Change the surgical dressing daily and reapply a dry dressing each time. ° °ACTIVITY °Walk with your walker as instructed. °Use walker as long as suggested by your caregivers. °Avoid periods of inactivity such as sitting longer than an hour when not asleep. This helps prevent blood clots.  °You may resume a sexual relationship in one month or when given the OK by your doctor.  °You may return to work once you are cleared by your doctor.  °Do not drive a car for 6 weeks or until released by you surgeon.  °Do not drive while taking narcotics. ° °WEIGHT BEARING °Weight bearing as tolerated with assist device (walker, cane, etc) as directed, use it as long as suggested by your surgeon or therapist, typically at least 4-6 weeks. ° °POSTOPERATIVE CONSTIPATION PROTOCOL °Constipation - defined medically as fewer than three stools per week and severe constipation as less than one stool per week. ° °One of the most common issues patients have following surgery is constipation.  Even if you have a regular bowel pattern at home, your normal regimen is likely to be disrupted due to multiple reasons following surgery.  Combination of anesthesia, postoperative narcotics, change in appetite and fluid intake all can affect your bowels.  In order to avoid complications following surgery, here are some recommendations in order to help you during your recovery period. ° °Colace (docusate) - Pick up an over-the-counter form   of Colace or another stool softener and take twice a day as long as you are requiring postoperative pain medications.  Take with a full glass of water daily.  If you experience loose stools or diarrhea, hold the colace until you stool forms back up.  If your symptoms do not get better within 1 week or if they get worse, check with your doctor. ° °Dulcolax (bisacodyl) - Pick up over-the-counter and take as directed by the product  packaging as needed to assist with the movement of your bowels.  Take with a full glass of water.  Use this product as needed if not relieved by Colace only.  ° °MiraLax (polyethylene glycol) - Pick up over-the-counter to have on hand.  MiraLax is a solution that will increase the amount of water in your bowels to assist with bowel movements.  Take as directed and can mix with a glass of water, juice, soda, coffee, or tea.  Take if you go more than two days without a movement. °Do not use MiraLax more than once per day. Call your doctor if you are still constipated or irregular after using this medication for 7 days in a row. ° °If you continue to have problems with postoperative constipation, please contact the office for further assistance and recommendations.  If you experience "the worst abdominal pain ever" or develop nausea or vomiting, please contact the office immediatly for further recommendations for treatment. ° °ITCHING ° If you experience itching with your medications, try taking only a single pain pill, or even half a pain pill at a time.  You can also use Benadryl over the counter for itching or also to help with sleep.  ° °TED HOSE STOCKINGS °Wear the elastic stockings on both legs for three weeks following surgery during the day but you may remove then at night for sleeping. ° °MEDICATIONS °See your medication summary on the “After Visit Summary” that the nursing staff will review with you prior to discharge.  You may have some home medications which will be placed on hold until you complete the course of blood thinner medication.  It is important for you to complete the blood thinner medication as prescribed by your surgeon.  Continue your approved medications as instructed at time of discharge. ° °PRECAUTIONS °If you experience chest pain or shortness of breath - call 911 immediately for transfer to the hospital emergency department.  °If you develop a fever greater that 101 F, purulent drainage  from wound, increased redness or drainage from wound, foul odor from the wound/dressing, or calf pain - CONTACT YOUR SURGEON.   °                                                °FOLLOW-UP APPOINTMENTS °Make sure you keep all of your appointments after your operation with your surgeon and caregivers. You should call the office at the above phone number and make an appointment for approximately two weeks after the date of your surgery or on the date instructed by your surgeon outlined in the "After Visit Summary". ° °RANGE OF MOTION AND STRENGTHENING EXERCISES  °These exercises are designed to help you keep full movement of your hip joint. Follow your caregiver's or physical therapist's instructions. Perform all exercises about fifteen times, three times per day or as directed. Exercise both hips, even if you have   had only one joint replacement. These exercises can be done on a training (exercise) mat, on the floor, on a table or on a bed. Use whatever works the best and is most comfortable for you. Use music or television while you are exercising so that the exercises are a pleasant break in your day. This will make your life better with the exercises acting as a break in routine you can look forward to.  °• Lying on your back, slowly slide your foot toward your buttocks, raising your knee up off the floor. Then slowly slide your foot back down until your leg is straight again.  °• Lying on your back spread your legs as far apart as you can without causing discomfort.  °• Lying on your side, raise your upper leg and foot straight up from the floor as far as is comfortable. Slowly lower the leg and repeat.  °• Lying on your back, tighten up the muscle in the front of your thigh (quadriceps muscles). You can do this by keeping your leg straight and trying to raise your heel off the floor. This helps strengthen the largest muscle supporting your knee.  °• Lying on your back, tighten up the muscles of your buttocks both  with the legs straight and with the knee bent at a comfortable angle while keeping your heel on the floor.  ° °IF YOU ARE TRANSFERRED TO A SKILLED REHAB FACILITY °If the patient is transferred to a skilled rehab facility following release from the hospital, a list of the current medications will be sent to the facility for the patient to continue.  When discharged from the skilled rehab facility, please have the facility set up the patient's Home Health Physical Therapy prior to being released. Also, the skilled facility will be responsible for providing the patient with their medications at time of release from the facility to include their pain medication, the muscle relaxants, and their blood thinner medication. If the patient is still at the rehab facility at time of the two week follow up appointment, the skilled rehab facility will also need to assist the patient in arranging follow up appointment in our office and any transportation needs. ° °MAKE SURE YOU:  °• Understand these instructions.  °• Get help right away if you are not doing well or get worse.  ° ° °Pick up stool softner and laxative for home use following surgery while on pain medications. °Do not submerge incision under water. °Please use good hand washing techniques while changing dressing each day. °May shower starting three days after surgery. °Please use a clean towel to pat the incision dry following showers. °Continue to use ice for pain and swelling after surgery. °Do not use any lotions or creams on the incision until instructed by your surgeon. ° °

## 2018-09-28 ENCOUNTER — Encounter (HOSPITAL_COMMUNITY): Payer: Self-pay | Admitting: Orthopedic Surgery

## 2018-09-28 LAB — CBC
HEMATOCRIT: 40 % (ref 39.0–52.0)
Hemoglobin: 12.6 g/dL — ABNORMAL LOW (ref 13.0–17.0)
MCH: 29.2 pg (ref 26.0–34.0)
MCHC: 31.5 g/dL (ref 30.0–36.0)
MCV: 92.8 fL (ref 80.0–100.0)
NRBC: 0 % (ref 0.0–0.2)
PLATELETS: 134 10*3/uL — AB (ref 150–400)
RBC: 4.31 MIL/uL (ref 4.22–5.81)
RDW: 12.4 % (ref 11.5–15.5)
WBC: 9.8 10*3/uL (ref 4.0–10.5)

## 2018-09-28 LAB — BASIC METABOLIC PANEL
ANION GAP: 10 (ref 5–15)
BUN: 19 mg/dL (ref 8–23)
CO2: 24 mmol/L (ref 22–32)
Calcium: 8.6 mg/dL — ABNORMAL LOW (ref 8.9–10.3)
Chloride: 106 mmol/L (ref 98–111)
Creatinine, Ser: 1.01 mg/dL (ref 0.61–1.24)
GFR calc non Af Amer: >60 mL/min (ref >60)
GLUCOSE: 133 mg/dL — AB (ref 70–99)
Potassium: 4.4 mmol/L (ref 3.5–5.1)
SODIUM: 140 mmol/L (ref 135–145)

## 2018-09-28 MED ORDER — TRAMADOL HCL 50 MG PO TABS: 50.0000 mg | ORAL_TABLET | Freq: Four times a day (QID) | ORAL | Status: DC | PRN

## 2018-09-28 MED ORDER — METHOCARBAMOL 500 MG PO TABS: 500.0000 mg | ORAL_TABLET | Freq: Four times a day (QID) | ORAL | 0 refills | Status: DC | PRN

## 2018-09-28 MED ORDER — TRAMADOL HCL 50 MG PO TABS: 50.0000 mg | ORAL_TABLET | Freq: Four times a day (QID) | ORAL | 0 refills | Status: DC | PRN

## 2018-09-28 MED ORDER — HYDROCODONE-ACETAMINOPHEN 5-325 MG PO TABS: 1.0000 | ORAL_TABLET | Freq: Four times a day (QID) | ORAL | 0 refills | Status: DC | PRN

## 2018-09-28 NOTE — Care Management Note (Signed)
Case Management Note  Patient Details  Name: Russell Garrett MRN: [id number] Date of Birth: [birth year]  Subjective/Objective:    Spoke with patient at bedside. Confirmed plan for HEP. Has RW and 3n1. [phone]                Action/Plan:   Expected Discharge Date:  09/28/18               Expected Discharge Plan:  Home/Self Care  In-House Referral:  NA  Discharge planning Services  CM Consult, NA  Post Acute Care Choice:  NA Choice offered to:  Patient  DME Arranged:  N/A DME Agency:  NA  HH Arranged:  NA HH Agency:  NA  Status of Service:  Completed, signed off  If discussed at [place] of Stay Meetings, dates discussed:    Additional Comments:  Guadalupe Maple, RN 09/28/2018, 11:15 AM

## 2018-09-28 NOTE — Plan of Care (Signed)
Patient dc'd home in stable condition. IV dc'd. Rx given

## 2018-09-28 NOTE — Progress Notes (Signed)
   Subjective: 1 Day Post-Op Procedure(s) (LRB): RIGHT TOTAL HIP ARTHROPLASTY ANTERIOR APPROACH (Right) Patient reports pain as mild.   Patient seen in rounds by Dr. Wynelle Link. Patient is well, and has had no acute complaints or problems other than discomfort in the right hip. Denies chest pain or SOB. No issues overnight. Foley catheter removed this AM. We will start therapy today.   Objective: Vital signs in last 24 hours: Temp:  [97.4 F (36.3 C)-98.3 F (36.8 C)] 98.3 F (36.8 C) (11/05 0203) Pulse Rate:  [61-89] 61 (11/05 0455) Resp:  [14-21] 16 (11/04 2154) BP: (127-163)/(66-100) 152/89 (11/05 0455) SpO2:  [96 %-100 %] 98 % (11/05 0455) Weight:  [128.4 kg] 128.4 kg (11/04 1252)  Intake/Output from previous day:  Intake/Output Summary (Last 24 hours) at 09/28/2018 0750 Last data filed at 09/28/2018 0746 Gross per 24 hour  Intake 2579.91 ml  Output 1720 ml  Net 859.91 ml     Intake/Output this shift: Total I/O In: 240 [P.O.:240] Out: -   Labs: Recent Labs    09/28/18 0419  HGB 12.6*   Recent Labs    09/28/18 0419  WBC 9.8  RBC 4.31  HCT 40.0  PLT 134*   Recent Labs    09/28/18 0419  NA 140  K 4.4  CL 106  CO2 24  BUN 19  CREATININE 1.01  GLUCOSE 133*  CALCIUM 8.6*   Exam: General - Patient is Alert and Oriented Extremity - Neurologically intact Neurovascular intact Sensation intact distally Dorsiflexion/Plantar flexion intact Dressing - dressing C/D/I Motor Function - intact, moving foot and toes well on exam.   Past Medical History:  Diagnosis Date  . Arthritis    r hip  . 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 Diverticulitis    pt denies  . Hyperlipidemia   . Hypertension   . 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 OSA (obstructive sleep apnea)   . Pulmonary air embolism ([place]) 02/2018   lifetime eliquis since then   . Sleep apnea    CPAP     Assessment/Plan: 1 Day Post-Op Procedure(s) (LRB): RIGHT TOTAL HIP ARTHROPLASTY ANTERIOR APPROACH (Right) Active Problems:   OA (osteoarthritis) of hip  Estimated body mass index is 36.34 kg/m as calculated from the following:   Height as of this encounter: 6\' 2"  (1.88 m).   Weight as of this encounter: 128.4 kg. Advance diet Up with therapy D/C IV fluids  DVT Prophylaxis - Eliquis Weight bearing as tolerated. D/C O2 and pulse ox and try on room air. Hemovac pulled without difficulty, will begin therapy.  Plan is to go Home after hospital stay. Plan for discharge today once meeting goals with therapy with HEP. Follow-up in the office in 2 weeks with Dr. Wynelle Link.  Theresa Duty, PA-C Orthopedic Surgery 09/28/2018, 7:50 AM

## 2018-09-28 NOTE — Evaluation (Signed)
Physical Therapy Evaluation Patient Details Name: Russell Garrett MRN: [id number] DOB: August 07, 1951 Today's Date: 09/28/2018   History of Present Illness  Patient is a 67 y/o male admitted for R direct anterior THA.  PMH positive for OSA on CPAP, thoracic aortic aneurysm, PE and HTN.  Clinical Impression  Patient presents with decreased mobility due to pain, decreased balance/activity tolerance and will benefit from skilled PT in the acute setting to allow return home with family support.     Follow Up Recommendations Follow surgeon's recommendation for DC plan and follow-up therapies    Equipment Recommendations  None recommended by PT    Recommendations for Other Services       Precautions / Restrictions Precautions Precautions: Fall Restrictions Weight Bearing Restrictions: No Other Position/Activity Restrictions: WBAT      Mobility  Bed Mobility Overal bed mobility: Needs Assistance Bed Mobility: Supine to Sit     Supine to sit: HOB elevated;Min guard     General bed mobility comments: increased time, pt assisted R LE on his own, assist for balance/safety  Transfers Overall transfer level: Needs assistance Equipment used: Rolling walker (2 wheeled) Transfers: Sit to/from Stand Sit to Stand: Min guard            Ambulation/Gait Ambulation/Gait assistance: Min Gaffer (Feet): 200 Feet Assistive device: Rolling walker (2 wheeled) Gait Pattern/deviations: Step-to pattern;Step-through pattern;Decreased stride length;Antalgic     General Gait Details: slightly antalgic, but progressing on his own from step to sequence to step through  Stairs            Wheelchair Mobility    Modified Rankin (Stroke Patients Only)       Balance Overall balance assessment: Mild deficits observed, not formally tested                                           Pertinent Vitals/Pain Pain Assessment: 0-10 Pain Score: 0-No pain     Home Living Family/patient expects to be discharged to:: Private residence Living Arrangements: Spouse/significant other Available Help at Discharge: Family;Available 24 hours/day Type of Home: House Home Access: Stairs to enter Entrance Stairs-Rails: Right Entrance Stairs-Number of Steps: 3 Home Layout: Two level;Able to live on main level with bedroom/bathroom Home Equipment: Bedside commode;Walker - 2 wheels;Cane - single point;Shower seat - built in;Grab bars - tub/shower      Prior Function Level of Independence: Independent         Comments: drives, does chores, etc     Hand Dominance   Dominant Hand: Right    Extremity/Trunk Assessment   Upper Extremity Assessment Upper Extremity Assessment: Overall WFL for tasks assessed    Lower Extremity Assessment Lower Extremity Assessment: RLE deficits/detail RLE Deficits / Details: limited hip flexion and weakness usual for post surgical status, hip flexion grossly 60 in supine, strength hip flexion 2-/5, knee extension 3+/5       Communication   Communication: No difficulties  Cognition Arousal/Alertness: Awake/alert Behavior During Therapy: WFL for tasks assessed/performed Overall Cognitive Status: Within Functional Limits for tasks assessed                                        General Comments      Exercises Total Joint Exercises Ankle Circles/Pumps: AROM;10 reps;Both;Supine Quad Sets: AROM;10  reps;Right;Supine Short Arc Quad: AROM;10 reps;Right;Supine Heel Slides: AAROM;AROM;10 reps;Right;Supine Hip ABduction/ADduction: 10 reps;Right;Supine;AROM   Assessment/Plan    PT Assessment Patient needs continued PT services  PT Problem List Decreased strength;Decreased range of motion;Decreased knowledge of use of DME;Pain;Decreased mobility       PT Treatment Interventions Gait training;Stair training;Balance training;Therapeutic exercise;Functional mobility training;DME  instruction;Therapeutic activities    PT Goals (Current goals can be found in the Care Plan section)  Acute Rehab PT Goals Patient Stated Goal: to go home today PT Goal Formulation: With patient Time For Goal Achievement: 09/30/18 Potential to Achieve Goals: Good    Frequency 7X/week   Barriers to discharge        Co-evaluation               AM-PAC PT "6 Clicks" Daily Activity  Outcome Measure Difficulty turning over in bed (including adjusting bedclothes, sheets and blankets)?: Unable Difficulty moving from lying on back to sitting on the side of the bed? : Unable Difficulty sitting down on and standing up from a chair with arms (e.g., wheelchair, bedside commode, etc,.)?: Unable Help needed moving to and from a bed to chair (including a wheelchair)?: A Little Help needed walking in hospital room?: A Little Help needed climbing 3-5 steps with a railing? : A Little 6 Click Score: 12    End of Session Equipment Utilized During Treatment: Gait belt Activity Tolerance: Patient tolerated treatment well Patient left: in chair;with call bell/phone within reach   PT Visit Diagnosis: Difficulty in walking, not elsewhere classified (R26.2);Pain Pain - Right/Left: Right Pain - part of body: Hip    Time: 1000-1019 PT Time Calculation (min) (ACUTE ONLY): 19 min   Charges:   PT Evaluation $PT Eval Low Complexity: [place], [place] Acute Rehabilitation Services [phone] 09/28/2018   Reginia Naas 09/28/2018, 11:26 AM

## 2018-09-28 NOTE — Progress Notes (Signed)
Physical Therapy Treatment Patient Details Name: Russell Garrett MRN: [id number] DOB: 07-30-1951 Today's Date: 09/28/2018    History of Present Illness Patient is a 67 y/o male admitted for R direct anterior THA.  PMH positive for OSA on CPAP, thoracic aortic aneurysm, PE and HTN.    PT Comments    Patient progressing to negotiate stairs and reports understanding of car transfer technique.  Wife present and educated in HEP as well as how to assist on stairs.  Feel pt safe for d/c home with wife assist.    Follow Up Recommendations  Follow surgeon's recommendation for DC plan and follow-up therapies     Equipment Recommendations  Rolling walker with 5" wheels    Recommendations for Other Services       Precautions / Restrictions Precautions Precautions: Fall Restrictions Other Position/Activity Restrictions: WBAT    Mobility  Bed Mobility Overal bed mobility: Needs Assistance Bed Mobility: Supine to Sit     Supine to sit: HOB elevated;Min guard     General bed mobility comments: up in chair  Transfers Overall transfer level: Needs assistance Equipment used: Rolling walker (2 wheeled) Transfers: Sit to/from Stand Sit to Stand: Supervision         General transfer comment: for safety  Ambulation/Gait Ambulation/Gait assistance: Supervision Gait Distance (Feet): 250 Feet Assistive device: Rolling walker (2 wheeled) Gait Pattern/deviations: Step-to pattern;Step-through pattern;Decreased stride length;Trunk flexed     General Gait Details: keeping R hip flexed throuhgout    Stairs Stairs: Yes Stairs assistance: Min guard Stair Management: One rail Right;Sideways;Step to pattern Number of Stairs: 4(x 2) General stair comments: wife present and educated how to assist; patient educated on sequence and technique   Wheelchair Mobility    Modified Rankin (Stroke Patients Only)       Balance Overall balance assessment: Mild deficits observed, not formally  tested                                          Cognition Arousal/Alertness: Awake/alert Behavior During Therapy: WFL for tasks assessed/performed Overall Cognitive Status: Within Functional Limits for tasks assessed                                        Exercises Total Joint Exercises Ankle Circles/Pumps: AROM;10 reps;Both;Supine Quad Sets: AROM;10 reps;Right;Supine Short Arc Quad: AROM;10 reps;Right;Supine Heel Slides: AAROM;AROM;10 reps;Right;Supine Hip ABduction/ADduction: 10 reps;Right;Supine;AROM    General Comments General comments (skin integrity, edema, etc.): reviewed HEP with wife and wrote in number of reps, etc      Pertinent Vitals/Pain Pain Score: 2  Pain Location: R hip Pain Descriptors / Indicators: Sore;Tightness Pain Intervention(s): Monitored during session;Repositioned    Home Living                      Prior Function            PT Goals (current goals can now be found in the care plan section) Acute Rehab PT Goals Patient Stated Goal: to go home today PT Goal Formulation: With patient Time For Goal Achievement: 09/30/18 Potential to Achieve Goals: Good Progress towards PT goals: Progressing toward goals    Frequency    7X/week      PT Plan Current plan remains appropriate;Equipment recommendations need to be updated  Co-evaluation              AM-PAC PT "6 Clicks" Daily Activity  Outcome Measure  Difficulty turning over in bed (including adjusting bedclothes, sheets and blankets)?: A Lot Difficulty moving from lying on back to sitting on the side of the bed? : Unable Difficulty sitting down on and standing up from a chair with arms (e.g., wheelchair, bedside commode, etc,.)?: Unable Help needed moving to and from a bed to chair (including a wheelchair)?: A Little Help needed walking in hospital room?: A Little Help needed climbing 3-5 steps with a railing? : A Little 6 Click  Score: 12    End of Session Equipment Utilized During Treatment: Gait belt Activity Tolerance: Patient tolerated treatment well Patient left: with call bell/phone within reach;in chair Nurse Communication: Other (comment)(needs RW) PT Visit Diagnosis: Difficulty in walking, not elsewhere classified (R26.2);Pain Pain - Right/Left: Right Pain - part of body: Hip     Time: 1350-1416 PT Time Calculation (min) (ACUTE ONLY): 26 min  Charges:  $Gait Training: 8-22 mins $Self Care/Home Management: [place], [place] [phone] 09/28/2018    Reginia Naas 09/28/2018, 2:58 PM

## 2018-09-29 NOTE — Anesthesia Postprocedure Evaluation (Signed)
Anesthesia Post Note  Patient: Russell Garrett  Procedure(s) Performed: RIGHT TOTAL HIP ARTHROPLASTY ANTERIOR APPROACH (Right Hip)     Patient location during evaluation: PACU Anesthesia Type: Spinal Level of consciousness: oriented and awake and alert Pain management: pain level controlled Vital Signs Assessment: post-procedure vital signs reviewed and stable Respiratory status: spontaneous breathing, respiratory function stable and patient connected to nasal cannula oxygen Cardiovascular status: blood pressure returned to baseline and stable Postop Assessment: no headache, no backache and no apparent nausea or vomiting Anesthetic complications: no    Last Vitals:  Vitals:   09/28/18 1030 09/28/18 1347  BP: (!) 143/75 121/73  Pulse: 75 72  Resp: 16 16  Temp: 36.5 C 36.6 C  SpO2: 98% 98%    Last Pain:  Vitals:   09/28/18 1347  TempSrc: Oral  PainSc:                  [name]

## 2018-10-04 NOTE — Discharge Summary (Signed)
Physician Discharge Summary   Patient ID: Russell Garrett MRN: [id number] DOB/AGE: 1951/03/23 67 y.o.  Admit date: 09/27/2018 Discharge date: 09/28/2018  Primary Diagnosis: Osteoarthritis, right hip   Admission Diagnoses:  Past Medical History:  Diagnosis Date  . Arthritis    r hip  . 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 Diverticulitis    pt denies  . Hyperlipidemia   . Hypertension   . 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 OSA (obstructive sleep apnea)   . Pulmonary air embolism ([place]) 02/2018   lifetime eliquis since then   . Sleep apnea    CPAP   Discharge Diagnoses:   Active Problems:   OA (osteoarthritis) of hip  Estimated body mass index is 36.34 kg/m as calculated from the following:   Height as of this encounter: 6' 2"  (1.88 m).   Weight as of this encounter: 128.4 kg.  Procedure:  Procedure(s) (LRB): RIGHT TOTAL HIP ARTHROPLASTY ANTERIOR APPROACH (Right)   Consults: None  HPI: Russell Garrett is a 67 y.o. male who has advanced end-stage arthritis of their Right  hip with progressively worsening pain and dysfunction.The patient has failed nonoperative management and presents for total hip arthroplasty.    Laboratory Data: Admission on 09/27/2018, Discharged on 09/28/2018  Component Date Value Ref Range Status  . WBC 09/28/2018 9.8  4.0 - 10.5 K/uL Final  . RBC 09/28/2018 4.31  4.22 - 5.81 MIL/uL Final  . Hemoglobin 09/28/2018 12.6* 13.0 - 17.0 g/dL Final  . HCT 09/28/2018 40.0  39.0 - 52.0 % Final  . MCV 09/28/2018 92.8  80.0 - 100.0 fL Final  . MCH 09/28/2018 29.2  26.0 - 34.0 pg Final  . MCHC 09/28/2018 31.5  30.0 - 36.0 g/dL Final  . RDW 09/28/2018 12.4  11.5 - 15.5 % Final  . Platelets 09/28/2018 134* 150 - 400 K/uL Final  . nRBC 09/28/2018 0.0  0.0 - 0.2 % Final   Performed at [facility], [place] [address], [place], [place] [address]  .  Sodium 09/28/2018 140  135 - 145 mmol/L Final  . Potassium 09/28/2018 4.4  3.5 - 5.1 mmol/L Final  . Chloride 09/28/2018 106  98 - 111 mmol/L Final  . CO2 09/28/2018 24  22 - 32 mmol/L Final  . Glucose, Bld 09/28/2018 133* 70 - 99 mg/dL Final  . BUN 09/28/2018 19  8 - 23 mg/dL Final  . Creatinine, Ser 09/28/2018 1.01  0.61 - 1.24 mg/dL Final  . Calcium 09/28/2018 8.6* 8.9 - 10.3 mg/dL Final  . GFR calc non Af Amer 09/28/2018 >60  >60 mL/min Final  . GFR calc Af Amer 09/28/2018 >60  >60 mL/min Final   Comment: (NOTE) The eGFR has been calculated using the CKD EPI equation. This calculation has not been validated in all clinical situations. eGFR's persistently <60 mL/min signify possible Chronic Kidney Disease.   Georgiann Hahn gap 09/28/2018 10  5 - 15 Final   Performed at [facility], [place] [address] [place], [place] [address]  Hospital Outpatient Visit on 09/17/2018  Component Date Value Ref Range Status  . MRSA, PCR 09/17/2018 NEGATIVE  NEGATIVE Final  . Staphylococcus aureus 09/17/2018 NEGATIVE  NEGATIVE Final   Comment: (NOTE) The Xpert SA Assay (FDA approved for NASAL specimens in patients 55 years of age and older), is one component of  a comprehensive surveillance program. It is not intended to diagnose infection nor to guide or monitor treatment. Performed at [facility], [place] [address], [place], [place] [address]   . aPTT 09/17/2018 29  24 - 36 seconds Final   Performed at [facility], [place] [address] [place], [place] [address]  . WBC 09/17/2018 6.2  4.0 - 10.5 K/uL Final  . RBC 09/17/2018 4.92  4.22 - 5.81 MIL/uL Final  . Hemoglobin 09/17/2018 14.8  13.0 - 17.0 g/dL Final  . HCT 09/17/2018 45.5  39.0 - 52.0 % Final  . MCV 09/17/2018 92.5  80.0 - 100.0 fL Final  . MCH 09/17/2018 30.1  26.0 - 34.0 pg Final  . MCHC 09/17/2018 32.5  30.0 - 36.0 g/dL Final  . RDW 09/17/2018 12.6  11.5 - 15.5 % Final  . Platelets  09/17/2018 176  150 - 400 K/uL Final  . nRBC 09/17/2018 0.0  0.0 - 0.2 % Final   Performed at [facility], [place] [address] [place], [place] [address]  . Sodium 09/17/2018 142  135 - 145 mmol/L Final  . Potassium 09/17/2018 4.1  3.5 - 5.1 mmol/L Final  . Chloride 09/17/2018 106  98 - 111 mmol/L Final  . CO2 09/17/2018 28  22 - 32 mmol/L Final  . Glucose, Bld 09/17/2018 92  70 - 99 mg/dL Final  . BUN 09/17/2018 23  8 - 23 mg/dL Final  . Creatinine, Ser 09/17/2018 1.27* 0.61 - 1.24 mg/dL Final  . Calcium 09/17/2018 8.9  8.9 - 10.3 mg/dL Final  . Total Protein 09/17/2018 7.8  6.5 - 8.1 g/dL Final  . Albumin 09/17/2018 4.3  3.5 - 5.0 g/dL Final  . AST 09/17/2018 28  15 - 41 U/L Final  . ALT 09/17/2018 31  0 - 44 U/L Final  . Alkaline Phosphatase 09/17/2018 76  38 - 126 U/L Final  . Total Bilirubin 09/17/2018 1.1  0.3 - 1.2 mg/dL Final  . GFR calc non Af Amer 09/17/2018 57* >60 mL/min Final  . GFR calc Af Amer 09/17/2018 >60  >60 mL/min Final   Comment: (NOTE) The eGFR has been calculated using the CKD EPI equation. This calculation has not been validated in all clinical situations. eGFR's persistently <60 mL/min signify possible Chronic Kidney Disease.   Georgiann Hahn gap 09/17/2018 8  5 - 15 Final   Performed at [facility], [place] [address], [place], [place] [address]  . Prothrombin Time 09/17/2018 12.8  11.4 - 15.2 seconds Final  . INR 09/17/2018 0.97   Final   Performed at [facility], [place] [address] [place], [place] [address]  . ABO/RH(D) 09/17/2018 A POS   Final  . Antibody Screen 09/17/2018 NEG   Final  . Sample Expiration 09/17/2018 09/30/2018   Final  . Extend sample reason 09/17/2018    Final                   Value:NO TRANSFUSIONS OR PREGNANCY IN THE PAST 3 MONTHS Performed at [place] [address], [place], [place] [address]   . ABO/RH(D) 09/17/2018    Final                   Value:A  POS Performed at [facility], [place] [address], [place],  [address]      X-Rays:Dg Pelvis Portable  Result Date: 09/27/2018 CLINICAL DATA:  Status post hip arthroplasty EXAM:  PORTABLE PELVIS 1-2 VIEWS COMPARISON:  09/27/2018, 03/23/2017 FINDINGS: Pubic symphysis and rami are intact. No fracture or malalignment. SI joints are patent. Status post right hip arthroplasty with normal alignment. Drain in the lateral soft tissues. IMPRESSION: Status post right hip replacement with expected postsurgical changes. Electronically Signed   By: Donavan Foil M.D.   On: 09/27/2018 18:04   Dg C-arm 1-60 Min-no Report  Result Date: 09/27/2018 Fluoroscopy was utilized by the requesting physician.  No radiographic interpretation.    EKG: Orders placed or performed during the hospital encounter of 02/25/18  . EKG 12-Lead  . EKG 12-Lead  . ED EKG  . ED EKG  . EKG     Hospital Course: Russell Garrett is a 67 y.o. who was admitted to [facility]. They were brought to the operating room on 09/27/2018 and underwent Procedure(s): RIGHT TOTAL HIP ARTHROPLASTY ANTERIOR APPROACH.  Patient tolerated the procedure well and was later transferred to the recovery room and then to the orthopaedic floor for postoperative care. They were given PO and IV analgesics for pain control following their surgery. They were given 24 hours of postoperative antibiotics of  Anti-infectives (From admission, onward)   Start     Dose/Rate Route Frequency Ordered Stop   09/27/18 2200  ceFAZolin (ANCEF) IVPB 2g/100 mL premix     2 g 200 mL/hr over 30 Minutes Intravenous Every 6 hours 09/27/18 2009 09/28/18 0524   09/27/18 0600  ceFAZolin (ANCEF) 3 g in dextrose 5 % 50 mL IVPB     3 g 100 mL/hr over 30 Minutes Intravenous On call to O.R. 09/26/18 1301 09/27/18 1611     and started on DVT prophylaxis in the form of Eliquis.   PT and OT were ordered for total joint protocol. Discharge planning consulted to  help with postop disposition and equipment needs.  Patient had a good night on the evening of surgery. They started to get up OOB with therapy on POD #1. Pt was seen during rounds and was ready to go home pending progress with therapy. Hemovac drain was pulled without difficulty. He worked with therapy on POD #1 and was meeting his goals. Pt was discharged to home later that day in stable condition.  Diet: Regular diet Activity: WBAT Follow-up: in 2 weeks with Dr. Wynelle Link Disposition: Home with home exercise program Discharged Condition: stable   Discharge Instructions    Call MD / Call 911   Complete by:  As directed    If you experience chest pain or shortness of breath, CALL 911 and be transported to the hospital emergency room.  If you develope a fever above 101 F, pus (white drainage) or increased drainage or redness at the wound, or calf pain, call your surgeon's office.   Change dressing   Complete by:  As directed    You may change your dressing on Wednesday, then change the dressing daily with sterile 4 x 4 inch gauze dressing and paper tape.   Constipation Prevention   Complete by:  As directed    Drink plenty of fluids.  Prune juice may be helpful.  You may use a stool softener, such as Colace (over the counter) 100 mg twice a day.  Use MiraLax (over the counter) for constipation as needed.   Diet - low sodium heart healthy   Complete by:  As directed    Discharge instructions   Complete by:  As directed    Dr. Gaynelle Arabian Total Joint Specialist  [facility] [address] [place], [place] [address] [phone]  ANTERIOR APPROACH TOTAL HIP REPLACEMENT POSTOPERATIVE DIRECTIONS   Hip Rehabilitation, Guidelines Following Surgery  The results of a hip operation are greatly improved after range of motion and muscle strengthening exercises. Follow all safety measures which are given to protect your hip. If any of these exercises cause increased pain or swelling  in your joint, decrease the amount until you are comfortable again. Then slowly increase the exercises. Call your caregiver if you have problems or questions.   HOME CARE INSTRUCTIONS  Remove items at home which could result in a fall. This includes throw rugs or furniture in walking pathways.  ICE to the affected hip every three hours for 30 minutes at a time and then as needed for pain and swelling.  Continue to use ice on the hip for pain and swelling from surgery. You may notice swelling that will progress down to the foot and ankle.  This is normal after surgery.  Elevate the leg when you are not up walking on it.   Continue to use the breathing machine which will help keep your temperature down.  It is common for your temperature to cycle up and down following surgery, especially at night when you are not up moving around and exerting yourself.  The breathing machine keeps your lungs expanded and your temperature down.  DIET You may resume your previous home diet once your are discharged from the hospital.  DRESSING / WOUND CARE / SHOWERING You may shower 3 days after surgery, but keep the wounds dry during showering.  You may use an occlusive plastic wrap (Press'n Seal for example), NO SOAKING/SUBMERGING IN THE BATHTUB.  If the bandage gets wet, change with a clean dry gauze.  If the incision gets wet, pat the wound dry with a clean towel. You may start showering once you are discharged home but do not submerge the incision under water. Just pat the incision dry and apply a dry gauze dressing on daily. Change the surgical dressing daily and reapply a dry dressing each time.  ACTIVITY Walk with your walker as instructed. Use walker as long as suggested by your caregivers. Avoid periods of inactivity such as sitting longer than an hour when not asleep. This helps prevent blood clots.  You may resume a sexual relationship in one month or when given the OK by your doctor.  You may return to  work once you are cleared by your doctor.  Do not drive a car for 6 weeks or until released by you surgeon.  Do not drive while taking narcotics.  WEIGHT BEARING Weight bearing as tolerated with assist device (walker, cane, etc) as directed, use it as long as suggested by your surgeon or therapist, typically at least 4-6 weeks.  POSTOPERATIVE CONSTIPATION PROTOCOL Constipation - defined medically as fewer than three stools per week and severe constipation as less than one stool per week.  One of the most common issues patients have following surgery is constipation.  Even if you have a regular bowel pattern at home, your normal regimen is likely to be disrupted due to multiple reasons following surgery.  Combination of anesthesia, postoperative narcotics, change in appetite and fluid intake all can affect your bowels.  In order to avoid complications following surgery, here are some recommendations in order to help you during your recovery period.  Colace (docusate) - Pick up an over-the-counter form of Colace or another stool softener and take  twice a day as long as you are requiring postoperative pain medications.  Take with a full glass of water daily.  If you experience loose stools or diarrhea, hold the colace until you stool forms back up.  If your symptoms do not get better within 1 week or if they get worse, check with your doctor.  Dulcolax (bisacodyl) - Pick up over-the-counter and take as directed by the product packaging as needed to assist with the movement of your bowels.  Take with a full glass of water.  Use this product as needed if not relieved by Colace only.   MiraLax (polyethylene glycol) - Pick up over-the-counter to have on hand.  MiraLax is a solution that will increase the amount of water in your bowels to assist with bowel movements.  Take as directed and can mix with a glass of water, juice, soda, coffee, or tea.  Take if you go more than two days without a movement. Do  not use MiraLax more than once per day. Call your doctor if you are still constipated or irregular after using this medication for 7 days in a row.  If you continue to have problems with postoperative constipation, please contact the office for further assistance and recommendations.  If you experience "the worst abdominal pain ever" or develop nausea or vomiting, please contact the office immediatly for further recommendations for treatment.  ITCHING  If you experience itching with your medications, try taking only a single pain pill, or even half a pain pill at a time.  You can also use Benadryl over the counter for itching or also to help with sleep.   TED HOSE STOCKINGS Wear the elastic stockings on both legs for three weeks following surgery during the day but you may remove then at night for sleeping.  MEDICATIONS See your medication summary on the "After Visit Summary" that the nursing staff will review with you prior to discharge.  You may have some home medications which will be placed on hold until you complete the course of blood thinner medication.  It is important for you to complete the blood thinner medication as prescribed by your surgeon.  Continue your approved medications as instructed at time of discharge.  PRECAUTIONS If you experience chest pain or shortness of breath - call 911 immediately for transfer to the hospital emergency department.  If you develop a fever greater that 101 F, purulent drainage from wound, increased redness or drainage from wound, foul odor from the wound/dressing, or calf pain - CONTACT YOUR SURGEON.                                                   FOLLOW-UP APPOINTMENTS Make sure you keep all of your appointments after your operation with your surgeon and caregivers. You should call the office at the above phone number and make an appointment for approximately two weeks after the date of your surgery or on the date instructed by your surgeon outlined  in the "After Visit Summary".  RANGE OF MOTION AND STRENGTHENING EXERCISES  These exercises are designed to help you keep full movement of your hip joint. Follow your caregiver's or physical therapist's instructions. Perform all exercises about fifteen times, three times per day or as directed. Exercise both hips, even if you have had only one joint replacement. These exercises can  be done on a training (exercise) mat, on the floor, on a table or on a bed. Use whatever works the best and is most comfortable for you. Use music or television while you are exercising so that the exercises are a pleasant break in your day. This will make your life better with the exercises acting as a break in routine you can look forward to.  Lying on your back, slowly slide your foot toward your buttocks, raising your knee up off the floor. Then slowly slide your foot back down until your leg is straight again.  Lying on your back spread your legs as far apart as you can without causing discomfort.  Lying on your side, raise your upper leg and foot straight up from the floor as far as is comfortable. Slowly lower the leg and repeat.  Lying on your back, tighten up the muscle in the front of your thigh (quadriceps muscles). You can do this by keeping your leg straight and trying to raise your heel off the floor. This helps strengthen the largest muscle supporting your knee.  Lying on your back, tighten up the muscles of your buttocks both with the legs straight and with the knee bent at a comfortable angle while keeping your heel on the floor.   IF YOU ARE TRANSFERRED TO A SKILLED REHAB FACILITY If the patient is transferred to a skilled rehab facility following release from the hospital, a list of the current medications will be sent to the facility for the patient to continue.  When discharged from the skilled rehab facility, please have the facility set up the patient's [place] prior to being  released. Also, the skilled facility will be responsible for providing the patient with their medications at time of release from the facility to include their pain medication, the muscle relaxants, and their blood thinner medication. If the patient is still at the rehab facility at time of the two week follow up appointment, the skilled rehab facility will also need to assist the patient in arranging follow up appointment in our office and any transportation needs.  MAKE SURE YOU:  Understand these instructions.  Get help right away if you are not doing well or get worse.    Pick up stool softner and laxative for home use following surgery while on pain medications. Do not submerge incision under water. Please use good hand washing techniques while changing dressing each day. May shower starting three days after surgery. Please use a clean towel to pat the incision dry following showers. Continue to use ice for pain and swelling after surgery. Do not use any lotions or creams on the incision until instructed by your surgeon.   Do not sit on low chairs, stoools or toilet seats, as it may be difficult to get up from low surfaces   Complete by:  As directed    Driving restrictions   Complete by:  As directed    No driving for two weeks   TED hose   Complete by:  As directed    Use stockings (TED hose) for three weeks on both leg(s).  You may remove them at night for sleeping.   Weight bearing as tolerated   Complete by:  As directed      Allergies as of 09/28/2018   No Known Allergies     Medication List    STOP taking these medications   naproxen sodium 220 MG tablet Commonly known as:  ALEVE  TAKE these medications   apixaban 5 MG Tabs tablet Commonly known as:  ELIQUIS Take 1 tablet (5 mg total) by mouth 2 (two) times daily.   atorvastatin 40 MG tablet Commonly known as:  LIPITOR Take 1 tablet (40 mg total) by mouth daily.   HYDROcodone-acetaminophen 5-325 MG  tablet Commonly known as:  NORCO/VICODIN Take 1-2 tablets by mouth every 6 (six) hours as needed for moderate pain (pain score 4-6).   losartan-hydrochlorothiazide 100-25 MG tablet Commonly known as:  HYZAAR Take 0.5 tablets by mouth daily.   methocarbamol 500 MG tablet Commonly known as:  ROBAXIN Take 1 tablet (500 mg total) by mouth every 6 (six) hours as needed for muscle spasms.   traMADol 50 MG tablet Commonly known as:  ULTRAM Take 1-2 tablets (50-100 mg total) by mouth every 6 (six) hours as needed for moderate pain.            Discharge Care Instructions  (From admission, onward)         Start     Ordered   09/28/18 0000  Weight bearing as tolerated     09/28/18 0754   09/28/18 0000  Change dressing    Comments:  You may change your dressing on Wednesday, then change the dressing daily with sterile 4 x 4 inch gauze dressing and paper tape.   09/28/18 0754         Follow-up Information    Gaynelle Arabian, MD. Schedule an appointment as soon as possible for a visit on 10/12/2018.   Specialty:  Orthopedic Surgery Contact information: [address] [place] [address] [phone]           Signed: Theresa Duty, PA-C Orthopedic Surgery 10/04/2018, 8:53 AM

## 2018-10-24 DIAGNOSIS — G4733 Obstructive sleep apnea (adult) (pediatric): Secondary | ICD-10-CM | POA: Diagnosis not present

## 2018-11-02 DIAGNOSIS — Z471 Aftercare following joint replacement surgery: Secondary | ICD-10-CM | POA: Diagnosis not present

## 2018-11-02 DIAGNOSIS — Z96641 Presence of right artificial hip joint: Secondary | ICD-10-CM | POA: Diagnosis not present

## 2018-11-24 DIAGNOSIS — G4733 Obstructive sleep apnea (adult) (pediatric): Secondary | ICD-10-CM | POA: Diagnosis not present

## 2018-12-25 DIAGNOSIS — G4733 Obstructive sleep apnea (adult) (pediatric): Secondary | ICD-10-CM | POA: Diagnosis not present

## 2019-01-23 DIAGNOSIS — G4733 Obstructive sleep apnea (adult) (pediatric): Secondary | ICD-10-CM | POA: Diagnosis not present

## 2019-02-23 DIAGNOSIS — G4733 Obstructive sleep apnea (adult) (pediatric): Secondary | ICD-10-CM | POA: Diagnosis not present

## 2019-03-16 ENCOUNTER — Other Ambulatory Visit: Payer: Self-pay | Admitting: Internal Medicine

## 2019-03-16 NOTE — Telephone Encounter (Signed)
Patient is scheduled for annual awv visit on 4/27.  Due to covid situation, and need for virtual encounter access, I have contacted [facility] to set up necessary appointment access with patient.   In the meantime, I will refill patients med X 3 months to allow time for coordination and if face to face needs to be rescheduled.   [facility] is aware to contact patient regarding appointment.

## 2019-03-21 ENCOUNTER — Encounter: Payer: PPO | Admitting: Internal Medicine

## 2019-03-25 DIAGNOSIS — G4733 Obstructive sleep apnea (adult) (pediatric): Secondary | ICD-10-CM | POA: Diagnosis not present

## 2019-04-06 ENCOUNTER — Other Ambulatory Visit: Payer: Self-pay | Admitting: Internal Medicine

## 2019-04-06 NOTE — Telephone Encounter (Signed)
Refill done Please schedule virtual Medicare wellness visit in the next few weeks

## 2019-04-19 ENCOUNTER — Encounter: Payer: Self-pay | Admitting: Internal Medicine

## 2019-04-19 ENCOUNTER — Ambulatory Visit (INDEPENDENT_AMBULATORY_CARE_PROVIDER_SITE_OTHER): Payer: PPO | Admitting: Internal Medicine

## 2019-04-19 VITALS — Temp 96.6°F | Ht 74.0 in | Wt 255.0 lb

## 2019-04-19 DIAGNOSIS — Z0000 Encounter for general adult medical examination without abnormal findings: Secondary | ICD-10-CM | POA: Diagnosis not present

## 2019-04-19 DIAGNOSIS — I10 Essential (primary) hypertension: Secondary | ICD-10-CM | POA: Diagnosis not present

## 2019-04-19 DIAGNOSIS — I712 Thoracic aortic aneurysm, without rupture, unspecified: Secondary | ICD-10-CM

## 2019-04-19 DIAGNOSIS — M47815 Spondylosis without myelopathy or radiculopathy, thoracolumbar region: Secondary | ICD-10-CM

## 2019-04-19 DIAGNOSIS — I700 Atherosclerosis of aorta: Secondary | ICD-10-CM | POA: Diagnosis not present

## 2019-04-19 DIAGNOSIS — I2699 Other pulmonary embolism without acute cor pulmonale: Secondary | ICD-10-CM

## 2019-04-19 DIAGNOSIS — Z7189 Other specified counseling: Secondary | ICD-10-CM | POA: Diagnosis not present

## 2019-04-19 MED ORDER — LOSARTAN POTASSIUM-HCTZ 100-25 MG PO TABS: 0.5000 | ORAL_TABLET | Freq: Every day | ORAL | 3 refills | Status: DC

## 2019-04-19 MED ORDER — TETANUS-DIPHTHERIA TOXOIDS TD 5-2 LFU IM INJ: 0.5000 mL | INJECTION | Freq: Once | INTRAMUSCULAR | 0 refills | Status: AC

## 2019-04-19 MED ORDER — ATORVASTATIN CALCIUM 40 MG PO TABS: 40.0000 mg | ORAL_TABLET | Freq: Every day | ORAL | 3 refills | Status: DC

## 2019-04-19 NOTE — Assessment & Plan Note (Signed)
No current symptoms Unprovoked so will continue eliquis indefinitely

## 2019-04-19 NOTE — Assessment & Plan Note (Signed)
Is on statin for secondary prevention °

## 2019-04-19 NOTE — Assessment & Plan Note (Signed)
Small---seen as incidental finding on CT angio Will plan to reimage next year (noted to not be any bigger than 10 years earlier)

## 2019-04-19 NOTE — Assessment & Plan Note (Signed)
BP Readings from Last 3 Encounters:  09/28/18 121/73  09/17/18 138/70  08/09/18 138/88   Has been well controlled  continue meds

## 2019-04-19 NOTE — Assessment & Plan Note (Signed)
See social history 

## 2019-04-19 NOTE — Progress Notes (Signed)
Subjective:    Patient ID: Russell Garrett, male    DOB: 14-Apr-1951, 68 y.o.   MRN: [id number]  HPI Virtual visit for Medicare wellness visit and follow up of chronic health conditions Identification done Reviewed billing and he gave his consent He is in his home, and I am in my office  Has been on walking 3-4 miles a day Eating better----Optivia diet Has lost 43# over the past 3 months Only drinking water now--no diet Cokes, rare beer now, etc  Had right THR and did very well Hasn't started golf yet--but has been busy with work around the house No other surgery or procedures   Still on the eliquis No further leg swelling (ankles are much better since his weight loss) No chest pain No SOB Reviewed the aneurysm seen on his CT angio--he didn't realize about this  Known atherosclerotic changes On statin No myalgias or GI problems  Still sleeps with the CPAP Did get the new machine after 6 months Uses it every night and all night---very happy with the results No daytime sleepiness  Other doctors--- Dr Ashby Dawes --sleep, Dr Maryann Conners, [facility], Dr Olivia Mackie, Dr Brugger---chiropractor Is exercising regularly Only an occasional beer No tobacco for over 3 years--quit smoking Vision and hearing are fine No falls No depression or anhedonia Independent with instrumental ADLs No memory problems---or only very slight (recall)  Current Outpatient Medications on File Prior to Visit  Medication Sig Dispense Refill  . atorvastatin (LIPITOR) 40 MG tablet TAKE 1 TABLET BY MOUTH DAILY 30 tablet 2  . ELIQUIS 5 MG TABS tablet TAKE 1 TABLET BY MOUTH TWICE DAILY 60 tablet 11  . losartan-hydrochlorothiazide (HYZAAR) 100-25 MG tablet Take 0.5 tablets by mouth daily. 45 tablet 3  . [DISCONTINUED] lisinopril-hydrochlorothiazide (PRINZIDE,ZESTORETIC) 10-12.5 MG per tablet Take 1 tablet by mouth daily. 30 tablet 11   No current facility-administered medications on file prior  to visit.     No Known Allergies  Past Medical History:  Diagnosis Date  . Arthritis    r hip  . 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 Diverticulitis    pt denies  . Hyperlipidemia   . Hypertension   . 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 OSA (obstructive sleep apnea)   . Pulmonary air embolism ([place]) 02/2018   lifetime eliquis since then   . Sleep apnea    CPAP    Past Surgical History:  Procedure Laterality Date  . BACK SURGERY    . COLONOSCOPY    . THORACENTESIS  08/2006   left pleural effusion  . 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 Hypertension Mother   . Heart disease Mother   . Kidney disease Mother   . Heart disease Father   . Stroke Father   . Prostate cancer Father   . Kidney disease Father   . Heart disease Brother        cardioversion for arrhythmia  . Diabetes Neg Hx   . Colon cancer Neg Hx     Social History   Socioeconomic History  . Marital status: Married    Spouse name: Not on file  . Number  of children: 2  . Years of education: Not on file  . Highest education level: Not on file  Occupational History  . Occupation: retired- part Media planner Needs  . Financial resource strain: Not on file  . Food insecurity:    Worry: Not on file    Inability: Not on file  . Transportation needs:    Medical: Not on file    Non-medical: Not on file  Tobacco Use  . Smoking status: Former Smoker    Packs/day: 1.00    Years: 35.00    Pack years: 35.00    Types: Cigarettes    Last attempt to quit: 12/16/2015    Years since quitting: 3.3  . Smokeless tobacco: Never Used  Substance and Sexual Activity  . Alcohol use: Yes     Alcohol/week: 0.0 standard drinks    Comment: occ  . Drug use: No  . Sexual activity: Not on file  Lifestyle  . Physical activity:    Days per week: Not on file    Minutes per session: Not on file  . Stress: Not on file  Relationships  . Social connections:    Talks on phone: Not on file    Gets together: Not on file    Attends religious service: Not on file    Active member of club or organization: Not on file    Attends meetings of clubs or organizations: Not on file    Relationship status: Not on file  . Intimate partner violence:    Fear of current or ex partner: Not on file    Emotionally abused: Not on file    Physically abused: Not on file    Forced sexual activity: Not on file  Other Topics Concern  . Not on file  Social History Narrative   Widowed   Remarried June 2014      Has living will   Wife is health care POA--- alternate is daughter, Magda Paganini   Would accept resuscitation    Probably no tube feeds if cognitively unaware.   Review of Systems Appetite is fine Sleeps fine Wears seat belt No heartburn or dysphagia Bowels are fine. No blood Voids fine---stream is fine. Nocturia every other night Some back pain at times--especially stiffness in AM. Does go to chiropractor No suspicious lesions or rash    Objective:   Physical Exam  Constitutional: He is oriented to person, place, and time. He appears well-developed. No distress.  Respiratory: Effort normal. No respiratory distress.  Musculoskeletal:        General: No edema.  Neurological: He is alert and oriented to person, place, and time.  President---"Trump, Obama, ?" [id number] Recall 3/3  Psychiatric: He has a normal mood and affect. His behavior is normal.           Assessment & Plan:

## 2019-04-19 NOTE — Assessment & Plan Note (Signed)
I have personally reviewed the Medicare Annual Wellness questionnaire and have noted 1. The patient's medical and social history 2. Their use of alcohol, tobacco or illicit drugs 3. Their current medications and supplements 4. The patient's functional ability including ADL's, fall risks, home safety risks and hearing or visual             impairment. 5. Diet and physical activities 6. Evidence for depression or mood disorders  The patients weight, height, BMI and visual acuity have been recorded in the chart I have made referrals, counseling and provided education to the patient based review of the above and I have provided the pt with a written personalized care plan for preventive services.  I have provided you with a copy of your personalized plan for preventive services. Please take the time to review along with your updated medication list.  Has vastly improved his lifestyle and lost weight--discussed maintenance Colon normal in 2018--no action now Will consider PSA again with his next needed blood work (?next year) Flu vaccine in the fall Rx for Td He will look into shingrix

## 2019-04-19 NOTE — Progress Notes (Signed)
Hearing Screening Comments: Not checked in the last 12 months Vision Screening Comments: Not checked in the last 12 months  

## 2019-04-19 NOTE — Assessment & Plan Note (Signed)
Mild Does okay with occasional chiropractic

## 2019-04-25 DIAGNOSIS — G4733 Obstructive sleep apnea (adult) (pediatric): Secondary | ICD-10-CM | POA: Diagnosis not present

## 2019-05-25 DIAGNOSIS — G4733 Obstructive sleep apnea (adult) (pediatric): Secondary | ICD-10-CM | POA: Diagnosis not present

## 2019-05-30 IMAGING — CT CT ANGIO CHEST
2 of 6 series · 16 of 46 positions shown · IV contrast (APPLIED)
Comparison: Chest CT - 11/25/2006

CLINICAL DATA: Worsening shortness of breath during the past week,
now with left-sided chest pain radiating to the arm. Evaluate for
pulmonary embolism.

EXAM:
CT ANGIOGRAPHY CHEST WITH CONTRAST
TECHNIQUE: Multidetector CT imaging of the chest was performed using the
standard protocol during bolus administration of intravenous
contrast. Multiplanar CT image reconstructions and MIPs were
obtained to evaluate the vascular anatomy.
CONTRAST:  75mL OMNIPAQUE IOHEXOL 350 MG/ML SOLN

[Series 5: thins · axial · 0.75mm/px · z∈[-355,-82]mm · 13 of 300 slices shown]
[im 14/300  lung]
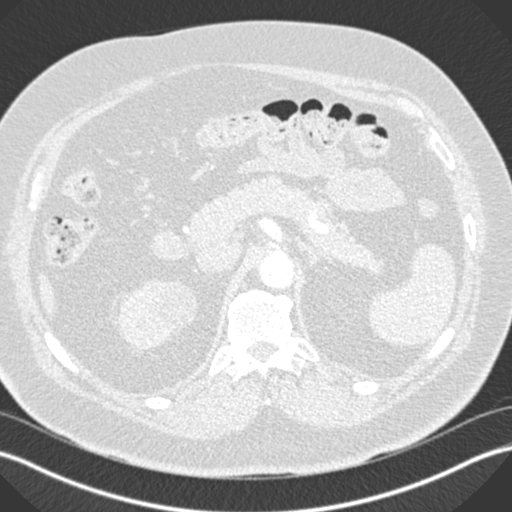
[im 40/300  soft-tissue]
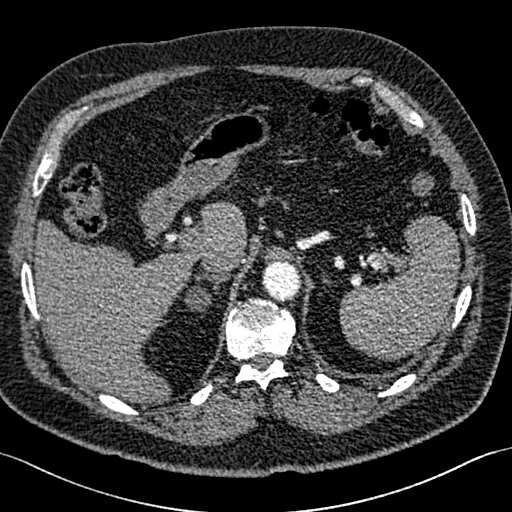
[im 66/300  lung]
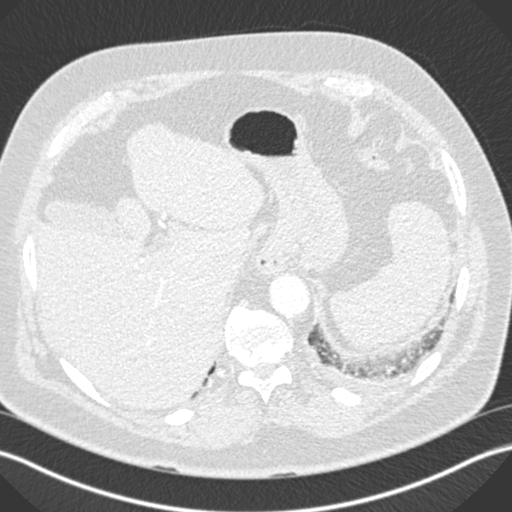
[im 79/300  soft-tissue]
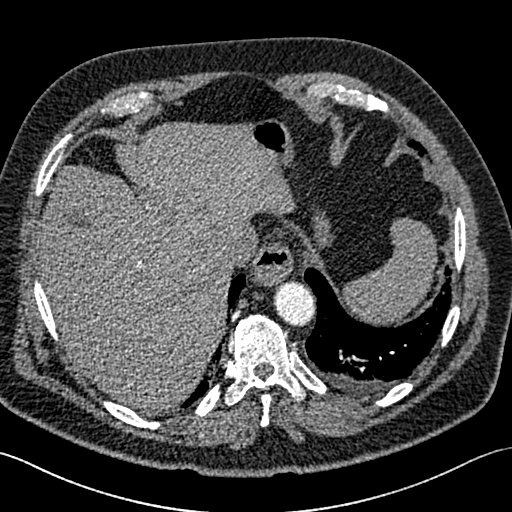
[im 105/300  lung]
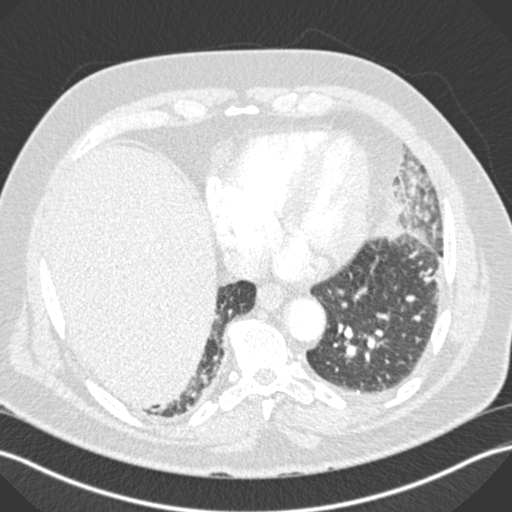
[im 131/300  soft-tissue]
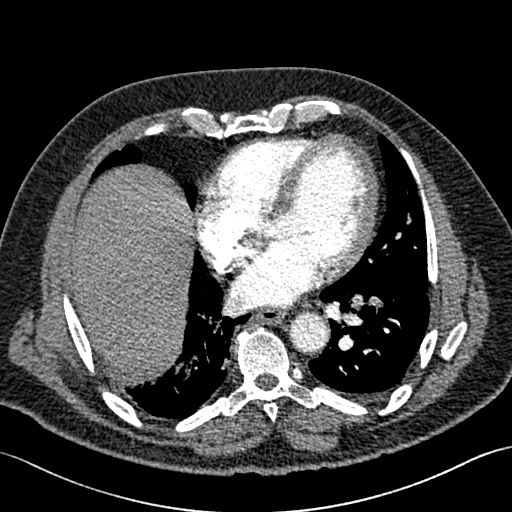
[im 157/300  lung]
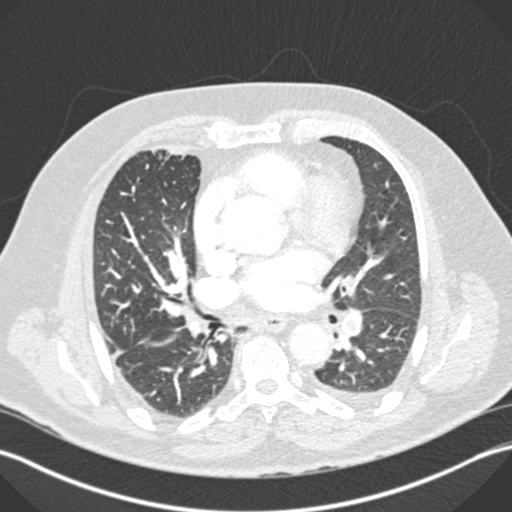
[im 170/300  soft-tissue]
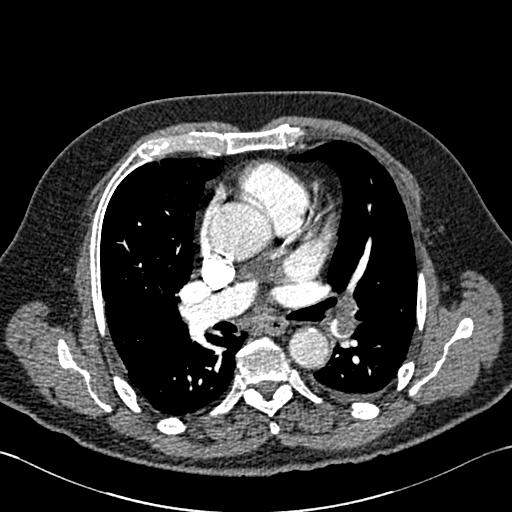
[im 196/300  lung]
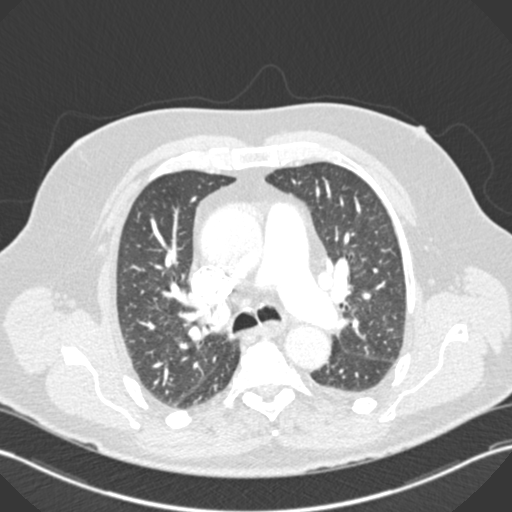
[im 222/300  soft-tissue]
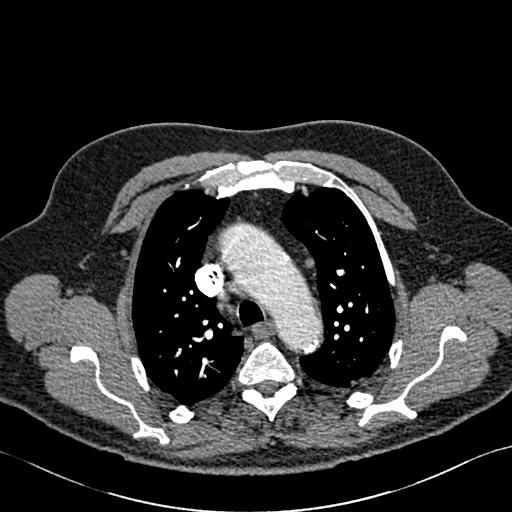
[im 235/300  lung]
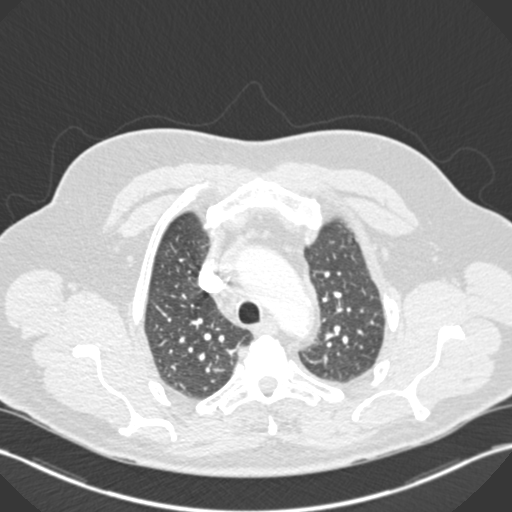
[im 261/300  soft-tissue]
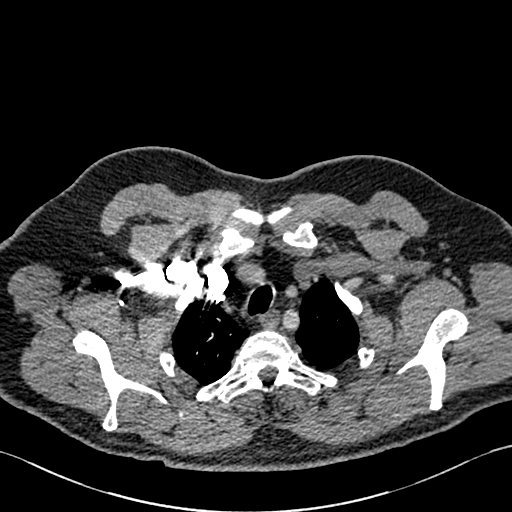
[im 287/300  lung]
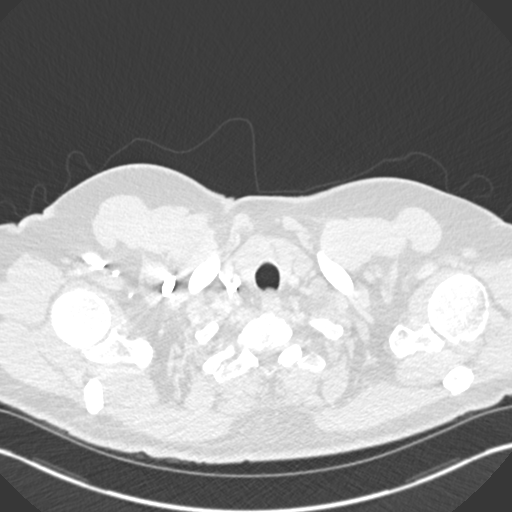

[Series 7: coronal mpr · coronal · 0.62mm/px · 3 of 104 slices shown]
[im 26/104  soft-tissue]
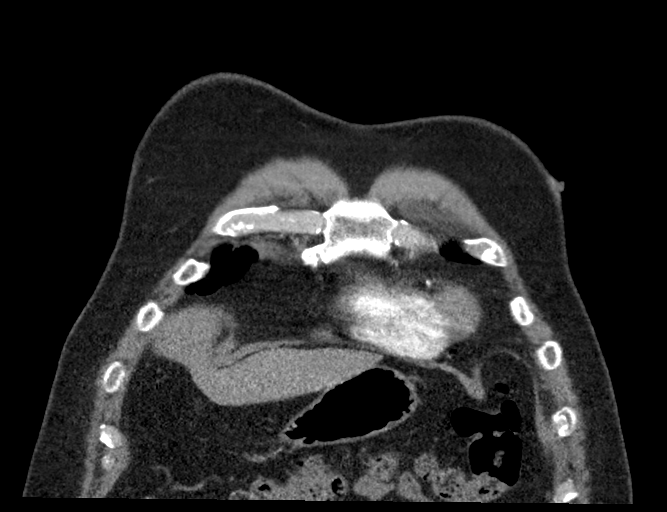
[im 52/104  soft-tissue]
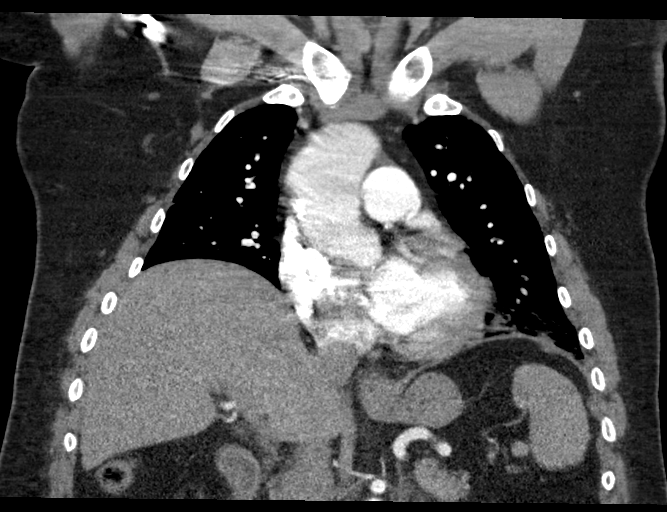
[im 78/104  soft-tissue]
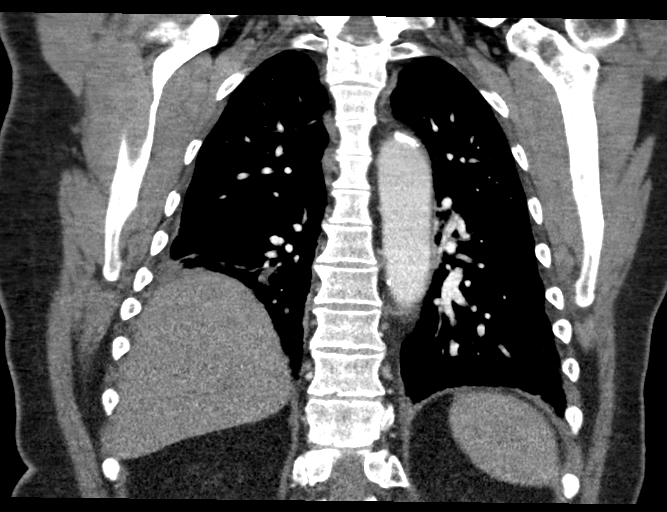

[16 of 46 positions shown; findings below may reference images not displayed]

FINDINGS: Vascular Findings:

There is adequate opacification of the pulmonary arterial system
with the main pulmonary artery measuring 229 Hounsfield units. There
is nonocclusive embolism seen within the left lower lobe pulmonary
artery (image 43, series 4; coronal image 75, series 7), extending
to involve left lower lobe segmental branches as well supplying the
lingula (image 47, series 4). No definite evidence of right-sided
pulmonary embolism. Overall clot burden is deemed small in volume.
Normal right to left ventricular ratio without CT evidence of
right-sided heart strain. Additionally, there is no reflux of
contrast into the intrahepatic venous system. Borderline enlarged
caliber of the main pulmonary artery measuring 3.3 cm in diameter,
similar to the [DATE] examination.

Normal heart size.  No pericardial effusion.

Scattered atherosclerotic plaque within the abdominal aorta.

Mild aneurysmal dilatation of the ascending thoracic aorta measuring
4.8 cm in diameter (image 38, series 4), grossly unchanged compared
to the [DATE] examination. The thoracic aorta tapers to a normal
caliber at the level of the aortic arch. Bovine configuration of the
aortic arch. The branch vessels of the aortic arch appear patent
throughout their course.

Review of the MIP images confirms the above findings.

----------------------------------------------------------------------------------

Nonvascular Findings:

Mediastinum/Lymph Nodes: Scattered mediastinal lymph nodes are
numerous though individually not enlarged by size criteria with
index right-sided paratracheal and AP window lymph nodes measuring
0.9 cm in greatest short axis diameter (images 25 and 31, series 4).
No bulky mediastinal, hilar axillary lymphadenopathy.

Lungs/Pleura: Contrast opacities within the inferior segment of the
lingula may represent atelectasis versus pulmonary infarction.
Minimal subsegmental atelectasis within the right lower lobe. Trace
left-sided pleural effusion. No pneumothorax. The central pulmonary
airways appear widely patent.

No discrete pulmonary nodules.

Upper abdomen: Limited evaluation of the upper abdomen demonstrates
2 adjacent exophytic cyst arising from the superior pole of the
right kidney with dominant cyst measuring approximately 3.3 cm
(image 95, series 4). Note is made of a small splenule. Small hiatal
hernia.

Musculoskeletal: Regional soft tissues appear normal. Normal
appearance of the thyroid gland. No acute or aggressive osseous
abnormalities. Stigmata of DISH within the thoracic spine.
IMPRESSION: 1. Examination is positive for small burden left-sided pulmonary
embolism as detailed above. No CT evidence of right-sided heart
strain.
2. Ground-glass opacities within the lingula may represent
atelectasis though of all vein pulmonary infarct could have a
similar appearance.
3. Trace left-sided pleural effusion, likely reactive.
4.  Aortic Atherosclerosis (540BM-2P3.3).
5. Mild fusiform aneurysmal dilatation of the ascending thoracic
aorta measuring 4.8 cm in diameter, similar to the [DATE]
examination. Aortic aneurysm NOS (540BM-V0P.J). Recommend
semi-annual imaging followup by CTA or MRA and referral to
cardiothoracic surgery if not already obtained. This recommendation
follows 6555 ACCF/AHA/AATS/ACR/ASA/SCA/ADDALA/GELTAR/NYISZTTOR/TIGER Guidelines
for the Diagnosis and Management of Patients With Thoracic Aortic
Disease. Circulation. 6555; 121: [id number]

Critical Value/emergent results were called by telephone at the time
of interpretation on 02/25/2018 at [DATE] to Dr. KLORENT SEREMI ,
who verbally acknowledged these results.

## 2019-05-30 IMAGING — CR DG CHEST 2V
2 series · 2 of 2 positions shown · non-contrast
Comparison: 07/19/2015

CLINICAL DATA: Shortness of breath and chest pain.

EXAM:
CHEST - 2 VIEW

[chest pa]
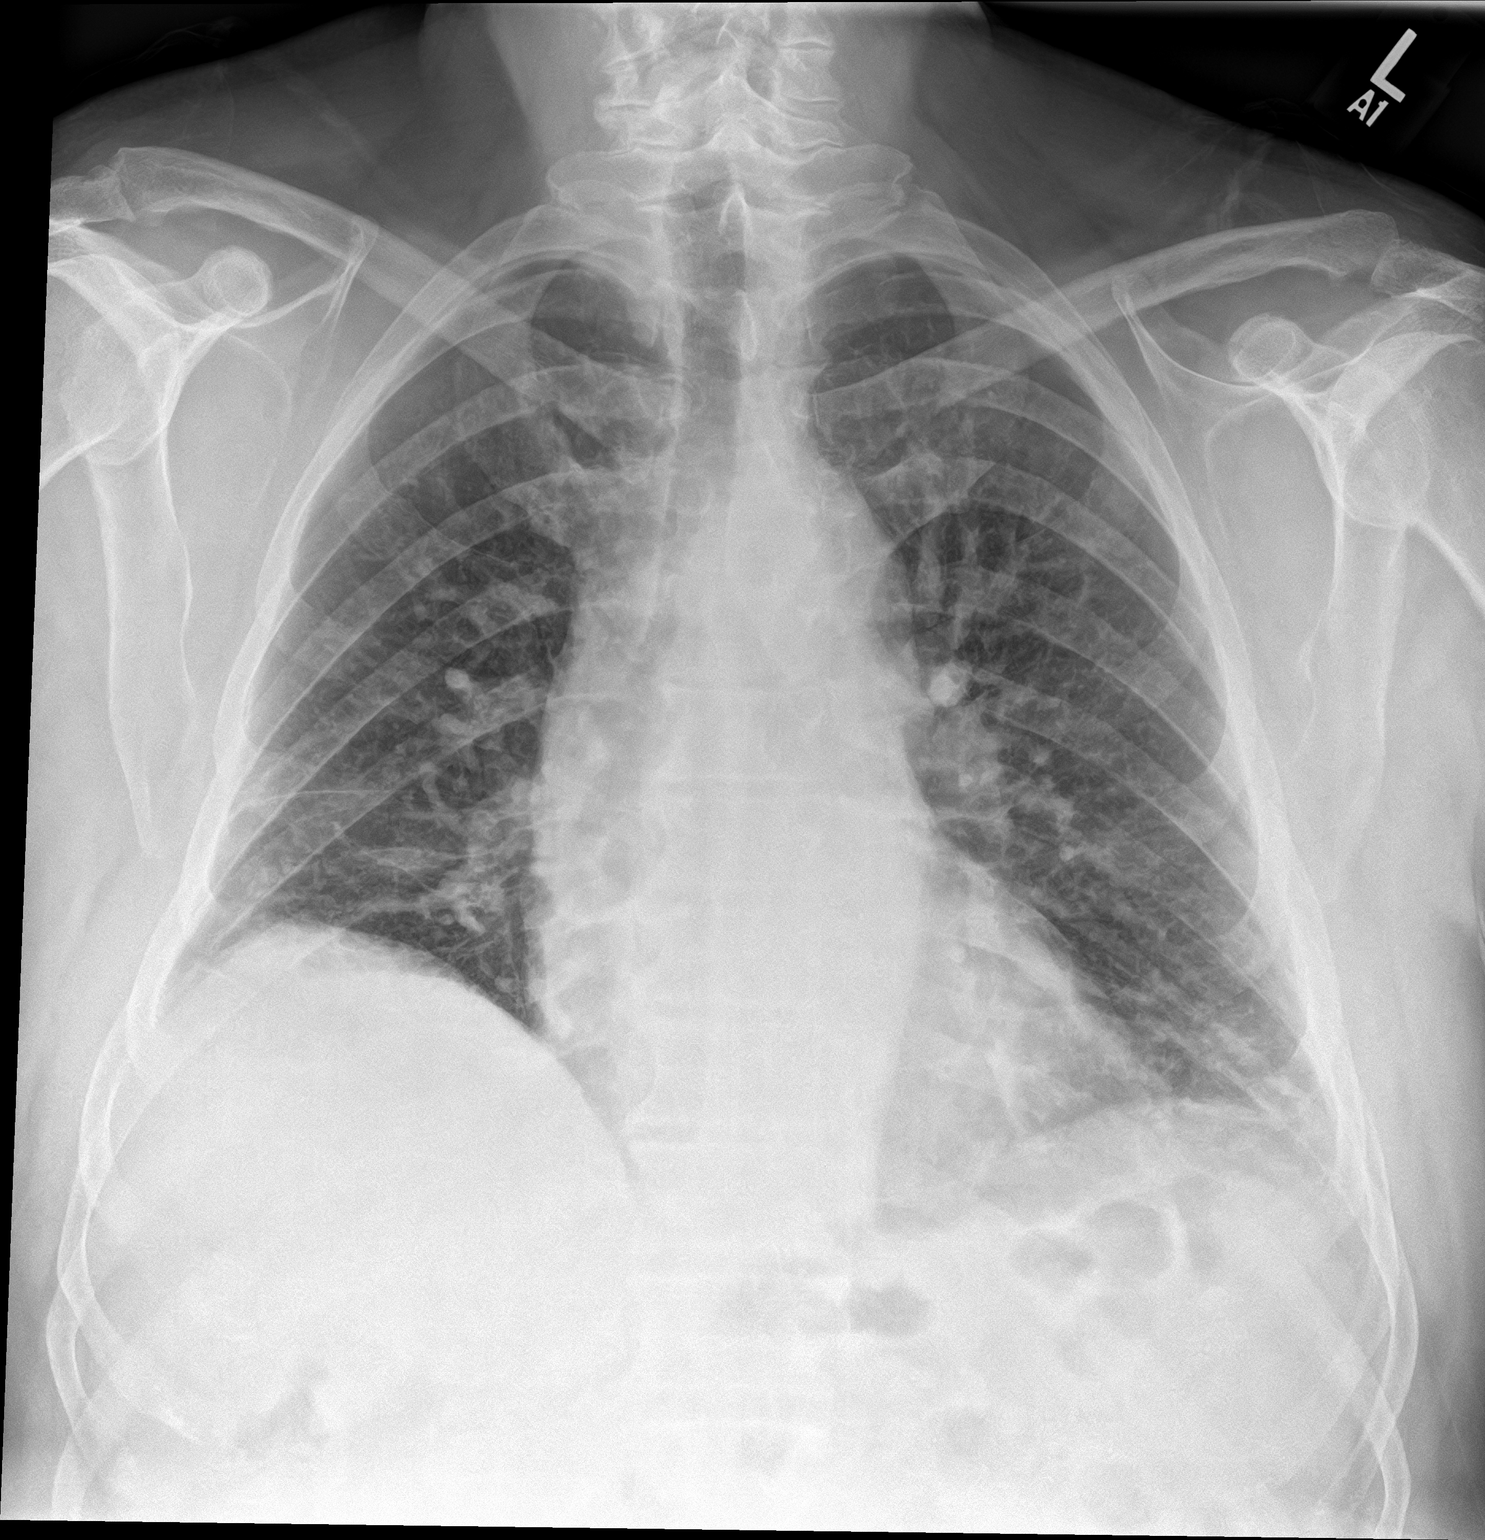

[chest lat]
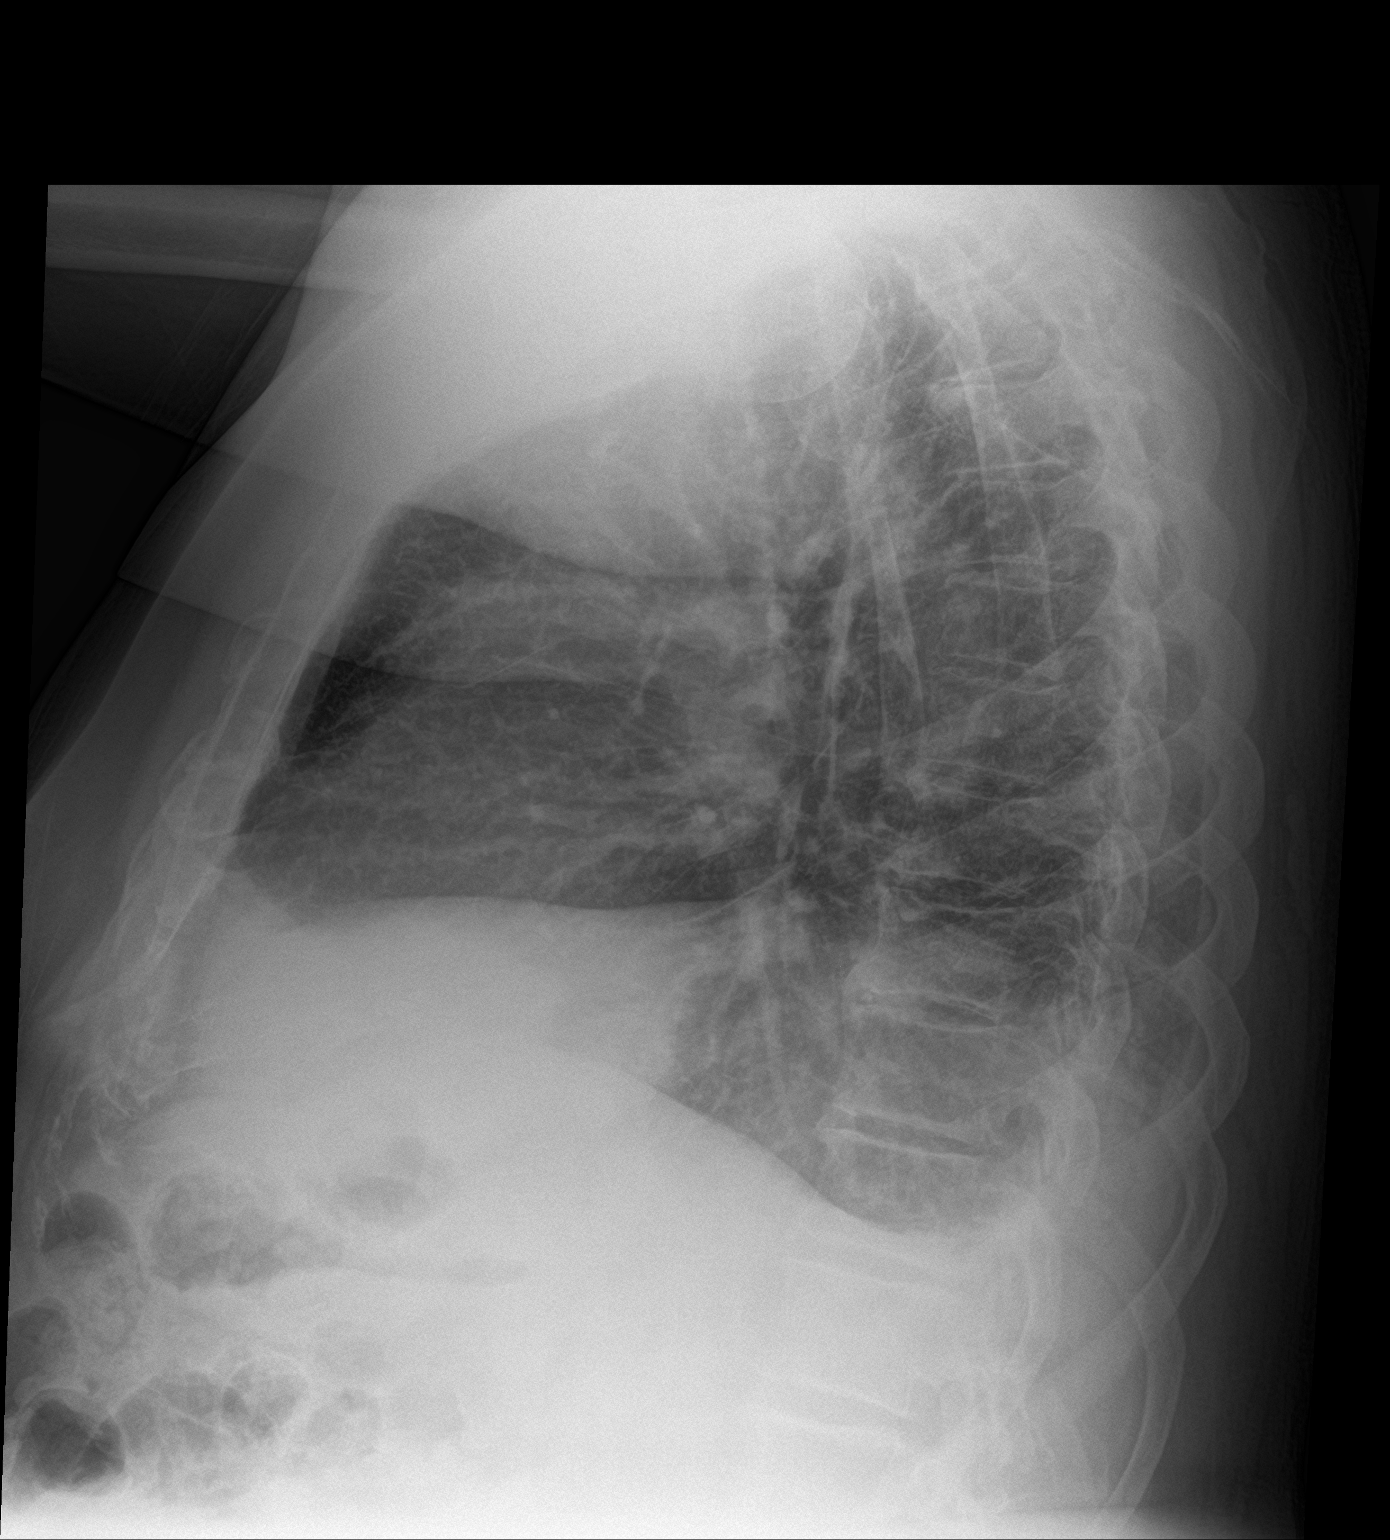

[2 of 2 positions shown; findings below may reference images not displayed]

FINDINGS: Normal heart size. Bilateral pleural effusions are identified.
Asymmetric elevation of right hemidiaphragm. Pulmonary vascular
congestion noted.
IMPRESSION: 1. Mild CHF.

## 2019-06-20 IMAGING — US US EXTREM LOW VENOUS BILAT
1 series · 13 of 24 positions shown · non-contrast
Comparison: None.

CLINICAL DATA: History of pulmonary embolism now with chronic
bilateral lower extremity swelling. Evaluate for DVT.



[Series 1: us extrem low venous bilat · 0.09mm/px · 13 of 59 slices shown]
[im 1/59]
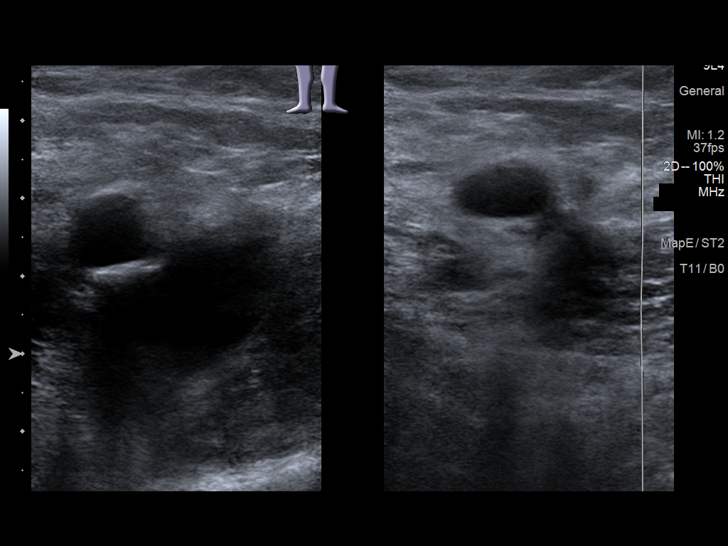
[im 6/59]
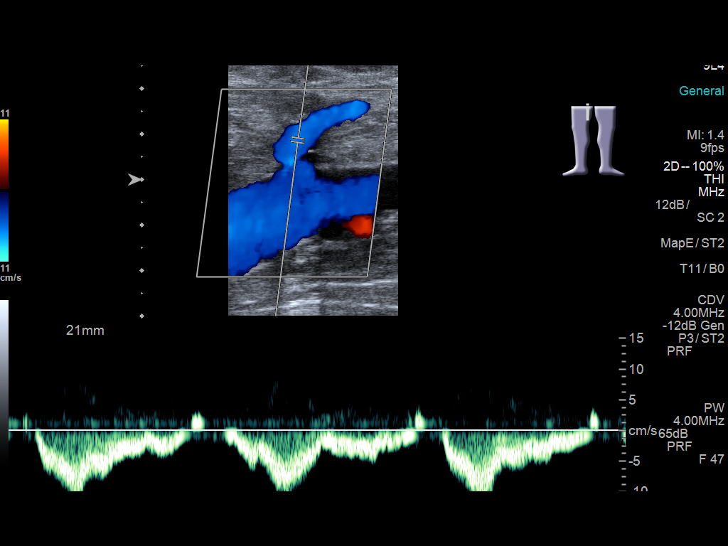
[im 11/59]
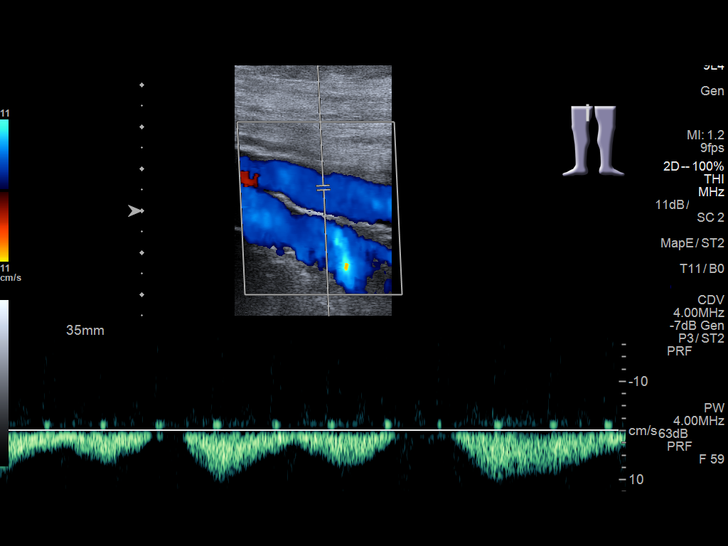
[im 16/59]
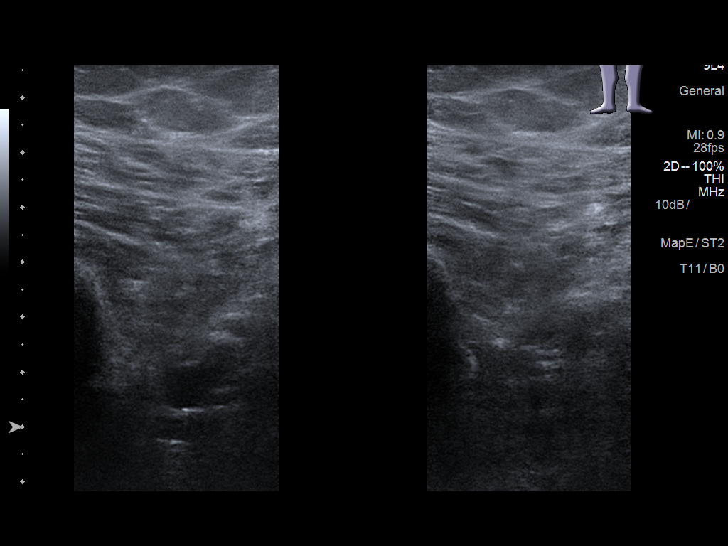
[im 21/59]
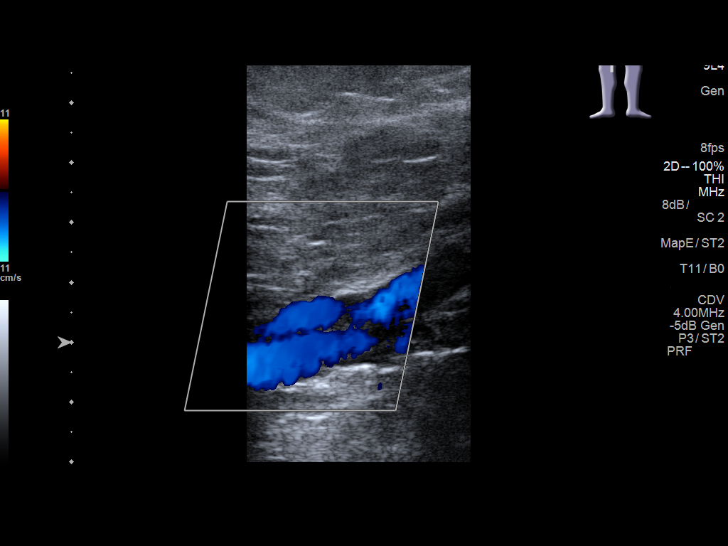
[im 26/59]
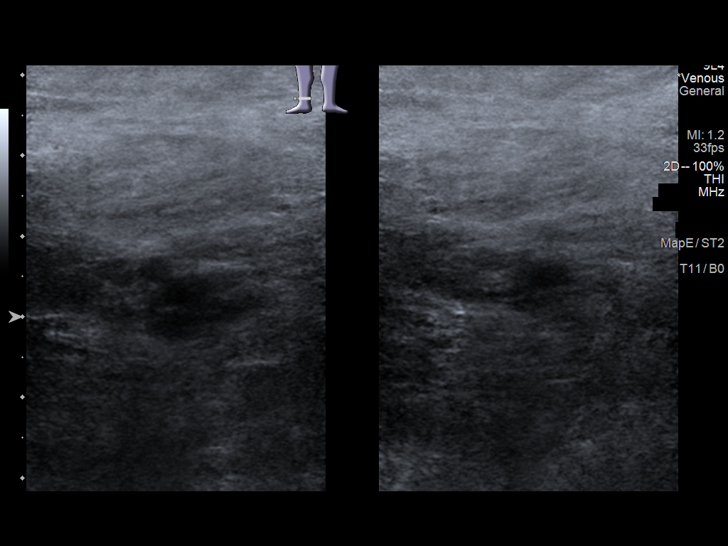
[im 31/59]
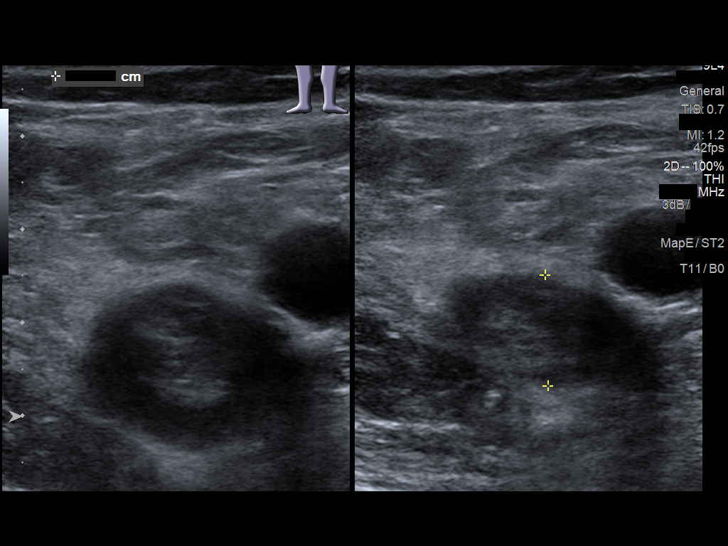
[im 33/59]
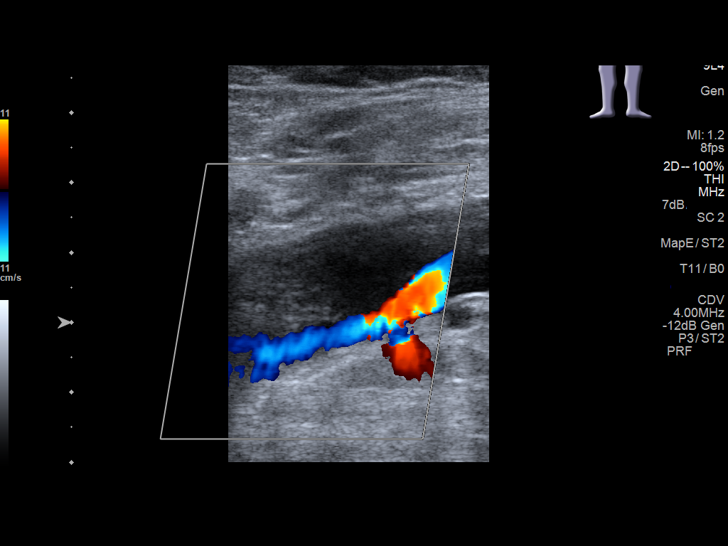
[im 38/59]
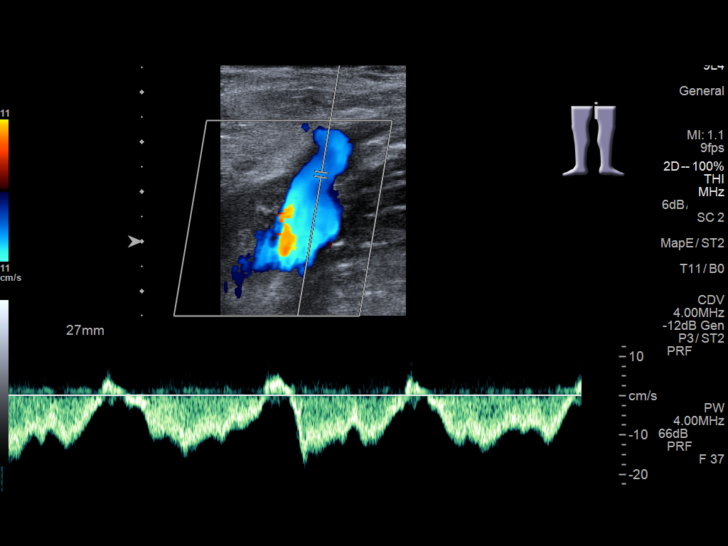
[im 43/59]
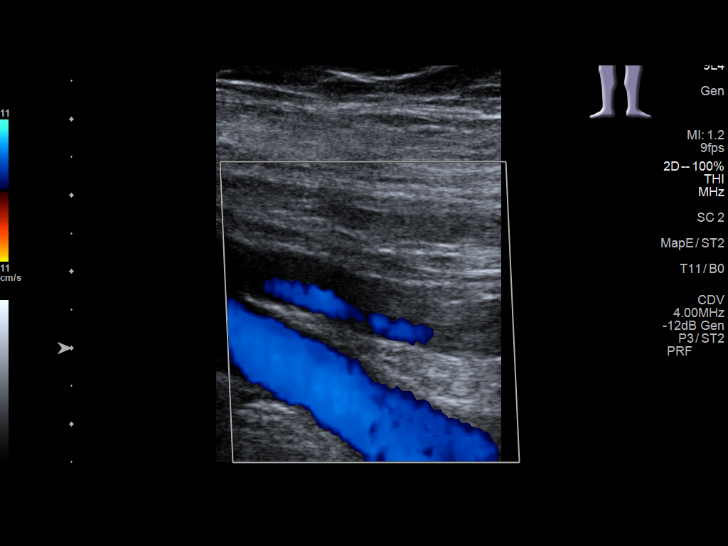
[im 48/59]
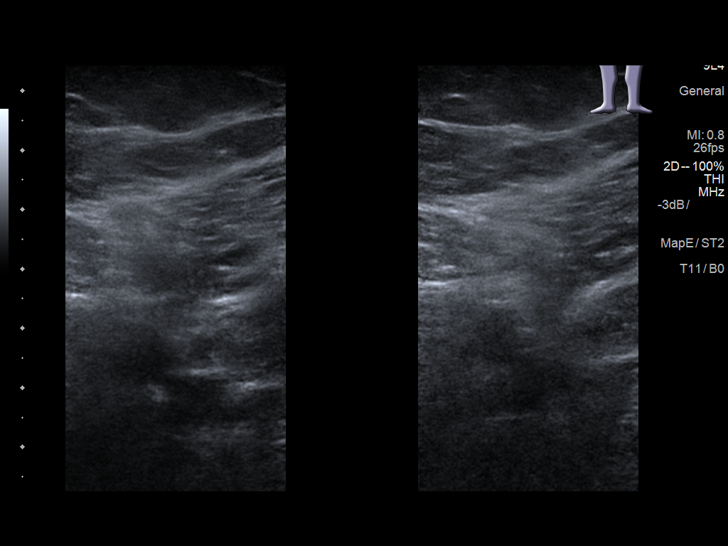
[im 53/59]
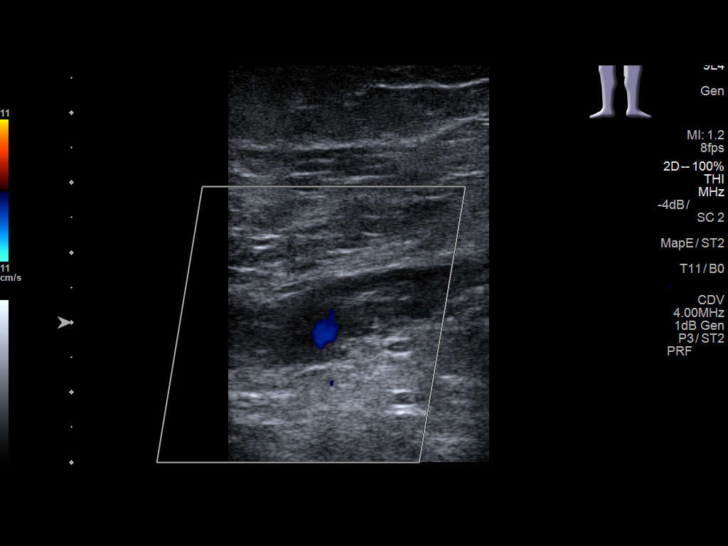
[im 59/59]
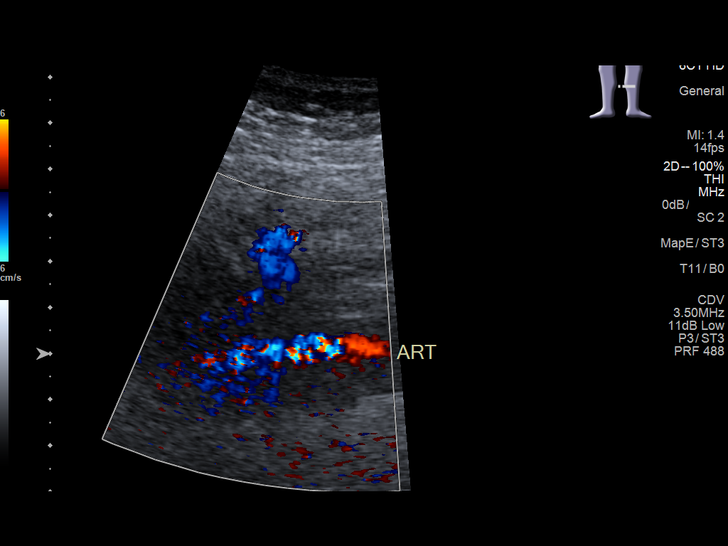

[13 of 24 positions shown; findings below may reference images not displayed]

FINDINGS: RIGHT LOWER EXTREMITY

Common Femoral Vein: No evidence of thrombus. Normal
compressibility, respiratory phasicity and response to augmentation.

Saphenofemoral Junction: No evidence of thrombus. Normal
compressibility and flow on color Doppler imaging.

Profunda Femoral Vein: No evidence of thrombus. Normal
compressibility and flow on color Doppler imaging.

Femoral Vein: No evidence of thrombus. Normal compressibility,
respiratory phasicity and response to augmentation.

Popliteal Vein: There is mixed echogenic nonocclusive wall
thickening/DVT within the right popliteal vein (images 22 through
28).

Calf Veins: No evidence of thrombus. Normal compressibility and flow
on color Doppler imaging.

Superficial Great Saphenous Vein: No evidence of thrombus. Normal
compressibility.

Other Findings:  None.

LEFT LOWER EXTREMITY

Common Femoral Vein: There is hypoechoic expansile near occlusive
thrombus within the left common femoral vein (images 34 and 35.

The saphenofemoral junction as well as the deep femoral vein both
appear patent.

There is hypoechoic near occlusive thrombus seen throughout the
atretic left femoral vein (representative images 46 through 53).

There is mixed echogenic near occlusive thrombus within the left
popliteal vein (images 54 through 60).

The left calf veins are not well imaged.

Other Findings:  None.
IMPRESSION: 1. Examination is positive for age-indeterminate near occlusive DVT
extending from the left common femoral through the left femoral and
popliteal veins. While some of the thrombus appears mixed echogenic
and thus potentially chronic, in the absence of prior examinations,
an acute on chronic process is not excluded. Clinical correlation is
advised.
2. Age-indeterminate, though potentially, chronic nonocclusive DVT
within the right popliteal vein.

## 2019-06-25 DIAGNOSIS — G4733 Obstructive sleep apnea (adult) (pediatric): Secondary | ICD-10-CM | POA: Diagnosis not present

## 2019-07-24 ENCOUNTER — Encounter: Payer: Self-pay | Admitting: Internal Medicine

## 2019-07-26 NOTE — Progress Notes (Signed)
[facility] Consultation      Assessment and Plan:  Severe obstructive sleep apnea. - Doing well with CPAP, continue CPAP every night.  Essential Hypertension.  - Above condition can be contributed to by obstructive sleep apnea, therefore adequate treatment of sleep apnea is important part of management.   Return in about 1 year (around 07/26/2020).    Date: 07/26/2019  MRN# [id number] Russell Garrett October 10, 1951  Referring Physician: Dr. Silvio Pate.  Russell Garrett is a 68 y.o. old male seen in consultation for chief complaint of:    Chief Complaint  Patient presents with  . Follow-up    wearing cpap avg 8-9hr nightly. feels pressure & mask is okay. waking feeling well rested.     HPI:  Russell Garrett is a 68 y.o. male with severe OSA.  He is doing well with his machine, he is more awake during the day, he is no longer sleepy during the day and not snoring. He cleans out machine every month. No other complaints.   **CPAP download 06/25/2019-07/24/2019>> uses greater than 4 hours is 29/30 days.  Average usage on days used is 8 hours 37 minutes.  Pressure ranges 10-20.  Median pressure 10, 95 percentile pressure 10.7, maximum pressure 11.2.  Leaks are within normal limits.  Residual AHI 0.2.  Overall this shows very good compliance with excellent control obstructive sleep apnea. **CPAP data 07/07/2018-08/05/2018>> uses greater than 4 hours is 30/30 days.  Average usage on days used is 9 hours 27 minutes.  Pressure ranges 10-20.  Median pressure is 10, 95th percentile pressure 11.7, maximum pressure 13.  Residual AHI 0.3, this shows excellent compliance with CPAP, with excellent control of obstructive sleep apnea. **CPAP download 06/19/2018-07/18/2018>> raw data personally reviewed.  Usage greater than 4 hours is 25/25 days, average usage on days used is 9 hours 51 minutes, pressure ranges 10-20.  Median pressure 10, 90 95th percentile pressure 12, maximum pressure 13.  Residual AHI  0.3.  Overall this shows excellent compliance with CPAP with excellent control of obstructive sleep apnea.  **Sleep study split night 05/28/18>>AHI 42.7, recommend cpap at pressure of with pressure of 10-20  Medication:    Current Outpatient Medications:  .  atorvastatin (LIPITOR) 40 MG tablet, Take 1 tablet (40 mg total) by mouth daily., Disp: 90 tablet, Rfl: 3 .  ELIQUIS 5 MG TABS tablet, TAKE 1 TABLET BY MOUTH TWICE DAILY, Disp: 60 tablet, Rfl: 11 .  losartan-hydrochlorothiazide (HYZAAR) 100-25 MG tablet, Take 0.5 tablets by mouth daily., Disp: 45 tablet, Rfl: 3   Allergies:  Patient has no known allergies.   Review of Systems:  Constitutional: Feels well. Cardiovascular: Denies chest pain, exertional chest pain.  Pulmonary: Denies hemoptysis, pleuritic chest pain.   The remainder of systems were reviewed and were found to be negative other than what is documented in the HPI.    Physical Examination:   VS: BP 126/72 (BP Location: Left Arm, Cuff Size: Normal)   Pulse (!) 52   Temp (!) 97.5 F (36.4 C) (Temporal)   Ht 6\' 2"  (1.88 m)   Wt 245 lb 6.4 oz (111.3 kg)   SpO2 96%   BMI 31.51 kg/m   General Appearance: No distress  Neuro:without focal findings, mental status, speech normal, alert and oriented HEENT: PERRLA, EOM intact Pulmonary: No wheezing, No rales  CardiovascularNormal S1,S2.  No m/r/g.  Abdomen: Benign, Soft, non-tender, No masses Renal:  No costovertebral tenderness  GU:  No performed at this  time. Endoc: No evident thyromegaly, no signs of acromegaly or Cushing features Skin:   warm, no rashes, no ecchymosis  Extremities: normal, no cyanosis, clubbing.     LABORATORY PANEL:   CBC No results for input(s): WBC, HGB, HCT, PLT in the last 168 hours. ------------------------------------------------------------------------------------------------------------------  Chemistries  No results for input(s): NA, K, CL, CO2, GLUCOSE, BUN, CREATININE, CALCIUM,  MG, AST, ALT, ALKPHOS, BILITOT in the last 168 hours.  Invalid input(s): GFRCGP ------------------------------------------------------------------------------------------------------------------  Cardiac Enzymes No results for input(s): TROPONINI in the last 168 hours. ------------------------------------------------------------  RADIOLOGY:  No results found.     Thank  you for the consultation and for allowing [facility] to assist in the care of your patient. Our recommendations are noted above.  Please contact us if we can be of further service.   Marda Stalker, M.D., F.C.C.P.  Board Certified in Internal Medicine, Pulmonary Medicine, [place], and Sleep Medicine.  [facility] Office Number: [phone]   07/26/2019

## 2019-07-27 ENCOUNTER — Other Ambulatory Visit: Payer: Self-pay

## 2019-07-27 ENCOUNTER — Encounter: Payer: Self-pay | Admitting: Internal Medicine

## 2019-07-27 ENCOUNTER — Ambulatory Visit: Payer: PPO | Admitting: Internal Medicine

## 2019-07-27 VITALS — BP 126/72 | HR 52 | Temp 97.5°F | Ht 74.0 in | Wt 245.4 lb

## 2019-07-27 DIAGNOSIS — Z23 Encounter for immunization: Secondary | ICD-10-CM

## 2019-07-27 DIAGNOSIS — G4733 Obstructive sleep apnea (adult) (pediatric): Secondary | ICD-10-CM | POA: Diagnosis not present

## 2019-07-27 NOTE — Patient Instructions (Signed)
Continue to use CPAP every night.  

## 2019-09-13 ENCOUNTER — Other Ambulatory Visit: Payer: Self-pay | Admitting: Internal Medicine

## 2019-10-25 ENCOUNTER — Other Ambulatory Visit: Payer: Self-pay

## 2019-10-25 DIAGNOSIS — Z20822 Contact with and (suspected) exposure to covid-19: Secondary | ICD-10-CM

## 2019-10-27 LAB — NOVEL CORONAVIRUS, NAA: SARS-CoV-2, NAA: NOT DETECTED

## 2020-01-07 ENCOUNTER — Ambulatory Visit: Payer: PPO | Attending: Internal Medicine

## 2020-01-07 DIAGNOSIS — Z23 Encounter for immunization: Secondary | ICD-10-CM | POA: Insufficient documentation

## 2020-01-07 NOTE — Progress Notes (Signed)
   Covid-19 Vaccination Clinic  Name:  DRAY LEREW    MRN: [id number] DOB: 01/09/1951  01/07/2020  Mr. Lacross was observed post Covid-19 immunization for 15 minutes without incidence. He was provided with Vaccine Information Sheet and instruction to access the V-Safe system.   Mr. Libengood was instructed to call 911 with any severe reactions post vaccine: Marland Kitchen Difficulty breathing  . Swelling of your face and throat  . A fast heartbeat  . A bad rash all over your body  . Dizziness and weakness    Immunizations Administered    Name Date Dose VIS Date Route   Pfizer COVID-19 Vaccine 01/07/2020  8:45 AM 0.3 mL 11/04/2019 Intramuscular   Manufacturer: [place]   Lot: [id number]   [place]: [id number]

## 2020-01-31 ENCOUNTER — Ambulatory Visit: Payer: PPO | Attending: Internal Medicine

## 2020-01-31 DIAGNOSIS — Z23 Encounter for immunization: Secondary | ICD-10-CM | POA: Insufficient documentation

## 2020-01-31 NOTE — Progress Notes (Signed)
   Covid-19 Vaccination Clinic  Name:  Russell Garrett    MRN: [id number] DOB: October 23, 1951  01/31/2020  [name] was observed post Covid-19 immunization for 15 minutes without incident. He was provided with Vaccine Information Sheet and instruction to access the V-Safe system.   [name] was instructed to call 911 with any severe reactions post vaccine: Marland Kitchen Difficulty breathing  . Swelling of face and throat  . A fast heartbeat  . A bad rash all over body  . Dizziness and weakness   Immunizations Administered    Name Date Dose VIS Date Route   Pfizer COVID-19 Vaccine 01/31/2020  1:53 PM 0.3 mL 11/04/2019 Intramuscular   Manufacturer: [place]   Lot: [id number]   [place]: [id number]

## 2020-03-07 DIAGNOSIS — H2513 Age-related nuclear cataract, bilateral: Secondary | ICD-10-CM | POA: Diagnosis not present

## 2020-04-03 ENCOUNTER — Other Ambulatory Visit: Payer: Self-pay | Admitting: Internal Medicine

## 2020-04-24 ENCOUNTER — Ambulatory Visit (INDEPENDENT_AMBULATORY_CARE_PROVIDER_SITE_OTHER): Payer: PPO | Admitting: Internal Medicine

## 2020-04-24 ENCOUNTER — Encounter: Payer: Self-pay | Admitting: Internal Medicine

## 2020-04-24 ENCOUNTER — Telehealth: Payer: Self-pay | Admitting: Internal Medicine

## 2020-04-24 ENCOUNTER — Other Ambulatory Visit: Payer: Self-pay

## 2020-04-24 VITALS — BP 124/84 | HR 57 | Temp 97.7°F | Ht 73.0 in | Wt 258.0 lb

## 2020-04-24 DIAGNOSIS — Z7189 Other specified counseling: Secondary | ICD-10-CM | POA: Diagnosis not present

## 2020-04-24 DIAGNOSIS — I700 Atherosclerosis of aorta: Secondary | ICD-10-CM

## 2020-04-24 DIAGNOSIS — G4733 Obstructive sleep apnea (adult) (pediatric): Secondary | ICD-10-CM | POA: Diagnosis not present

## 2020-04-24 DIAGNOSIS — Z125 Encounter for screening for malignant neoplasm of prostate: Secondary | ICD-10-CM | POA: Diagnosis not present

## 2020-04-24 DIAGNOSIS — I712 Thoracic aortic aneurysm, without rupture, unspecified: Secondary | ICD-10-CM

## 2020-04-24 DIAGNOSIS — I10 Essential (primary) hypertension: Secondary | ICD-10-CM

## 2020-04-24 DIAGNOSIS — Z0000 Encounter for general adult medical examination without abnormal findings: Secondary | ICD-10-CM | POA: Diagnosis not present

## 2020-04-24 DIAGNOSIS — I2699 Other pulmonary embolism without acute cor pulmonale: Secondary | ICD-10-CM

## 2020-04-24 LAB — CBC
HCT: 43.2 % (ref 39.0–52.0)
Hemoglobin: 14.4 g/dL (ref 13.0–17.0)
MCHC: 33.3 g/dL (ref 30.0–36.0)
MCV: 92.1 fl (ref 78.0–100.0)
Platelets: 145 10*3/uL — ABNORMAL LOW (ref 150.0–400.0)
RBC: 4.69 Mil/uL (ref 4.22–5.81)
RDW: 13.5 % (ref 11.5–15.5)
WBC: 3.9 10*3/uL — ABNORMAL LOW (ref 4.0–10.5)

## 2020-04-24 LAB — LIPID PANEL
Cholesterol: 151 mg/dL (ref 0–200)
HDL: 54.6 mg/dL (ref >39.00)
LDL Cholesterol: 86 mg/dL (ref 0–99)
NonHDL: 96.43
Total CHOL/HDL Ratio: 3
Triglycerides: 52 mg/dL (ref 0.0–149.0)
VLDL: 10.4 mg/dL (ref 0.0–40.0)

## 2020-04-24 LAB — COMPREHENSIVE METABOLIC PANEL
ALT: 19 U/L (ref 0–53)
AST: 21 U/L (ref 0–37)
Albumin: 4.3 g/dL (ref 3.5–5.2)
Alkaline Phosphatase: 62 U/L (ref 39–117)
BUN: 22 mg/dL (ref 6–23)
CO2: 32 mEq/L (ref 19–32)
Calcium: 9.2 mg/dL (ref 8.4–10.5)
Chloride: 104 mEq/L (ref 96–112)
Creatinine, Ser: 1.03 mg/dL (ref 0.40–1.50)
GFR: 71.64 mL/min (ref >60.00)
Glucose, Bld: 99 mg/dL (ref 70–99)
Potassium: 4.3 mEq/L (ref 3.5–5.1)
Sodium: 141 mEq/L (ref 135–145)
Total Bilirubin: 0.5 mg/dL (ref 0.2–1.2)
Total Protein: 6.9 g/dL (ref 6.0–8.3)

## 2020-04-24 LAB — PSA, MEDICARE: PSA: 1.25 ng/ml (ref 0.10–4.00)

## 2020-04-24 NOTE — Assessment & Plan Note (Signed)
BP Readings from Last 3 Encounters:  04/24/20 124/84  07/27/19 126/72  09/28/18 121/73   Good control on losartan Will check labs

## 2020-04-24 NOTE — Assessment & Plan Note (Signed)
Found on CT scan  is on statin

## 2020-04-24 NOTE — Assessment & Plan Note (Signed)
Due for follow up Will set up with CT surgery

## 2020-04-24 NOTE — Assessment & Plan Note (Signed)
I have personally reviewed the Medicare Annual Wellness questionnaire and have noted 1. The patient's medical and social history 2. Their use of alcohol, tobacco or illicit drugs 3. Their current medications and supplements 4. The patient's functional ability including ADL's, fall risks, home safety risks and hearing or visual             impairment. 5. Diet and physical activities 6. Evidence for depression or mood disorders  The patients weight, height, BMI and visual acuity have been recorded in the chart I have made referrals, counseling and provided education to the patient based review of the above and I have provided the pt with a written personalized care plan for preventive services.  I have provided you with a copy of your personalized plan for preventive services. Please take the time to review along with your updated medication list.  Healthy Discussed fitness---add resistance training Flu vaccine in the fall Consider colon 2028 Will check PSA Consider shingrix at pharmacy

## 2020-04-24 NOTE — Assessment & Plan Note (Signed)
Unprovoked so will continue the eliquis indefinitely

## 2020-04-24 NOTE — Progress Notes (Signed)
Subjective:    Patient ID: Russell Garrett, male    DOB: 02-27-1951, 69 y.o.   MRN: [id number]  HPI Here for Medicare wellness visit and follow up of chronic health conditions This visit occurred during the SARS-CoV-2 public health emergency.  Safety protocols were in place, including screening questions prior to the visit, additional usage of staff PPE, and extensive cleaning of exam room while observing appropriate contact time as indicated for disinfecting solutions.   Reviewed form and advanced directives Reviewed other doctors Occasional beer No tobacco Walks 3-5 miles per day--no resistance work Has started playing golf again--no pain in the hip Vision and hearing are fine No falls No depression or anhedonia Independent with instrumental ADLs Some trouble with names--no other memory issues  Continues on eliquis Unprovoked pulmonary embolus Decision for indefinite Rx  No chest pain No palpitations No cough or SOB Mild edema at the end of the day--if just sitting around (does have compression hose)  Reviewed the aneurysm found incidentally Will set up with CT surgery  Sleeps with CPAP nightly Feels well after using this---helps him stay rested  Current Outpatient Medications on File Prior to Visit  Medication Sig Dispense Refill  . Ascorbic Acid (VITAMIN C) 1000 MG tablet Take 1,000 mg by mouth daily.    Marland Kitchen atorvastatin (LIPITOR) 40 MG tablet TAKE ONE TABLET BY MOUTH EVERY DAY 90 tablet 3  . ELIQUIS 5 MG TABS tablet TAKE ONE TABLET BY MOUTH TWICE DAILY 60 tablet 11  . hydrochlorothiazide (HYDRODIURIL) 12.5 MG tablet TAKE 1 TABLET BY MOUTH DAILY ALONG WITH THE LOSARTAN 50MG  (THE COMBINATION TABLET IS CURRENTLY UNAVAILABLE) 90 tablet 3  . losartan (COZAAR) 50 MG tablet TAKE 1 TABLET BY MOUTH DAILY ALONG WITH THE HCTZ 12.5MG  (THE COMBINATION TABLET IS CURRENTLY UNAVAILABLE) 90 tablet 3  . VITAMIN D PO Take by mouth.    . [DISCONTINUED] lisinopril-hydrochlorothiazide  (PRINZIDE,ZESTORETIC) 10-12.5 MG per tablet Take 1 tablet by mouth daily. 30 tablet 11   No current facility-administered medications on file prior to visit.    Not on File  Past Medical History:  Diagnosis Date  . Arthritis    r hip  . 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 Diverticulitis    pt denies  . Hyperlipidemia   . Hypertension   . 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 OSA (obstructive sleep apnea)   . Pulmonary air embolism ([place]) 02/2018   lifetime eliquis since then   . Sleep apnea    CPAP    Past Surgical History:  Procedure Laterality Date  . BACK SURGERY    . COLONOSCOPY    . THORACENTESIS  08/2006   left pleural effusion  . 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 Hypertension Mother   . Heart disease Mother   . Kidney disease Mother   . Heart disease Father   . Stroke Father   . Prostate cancer Father   . Kidney disease Father   . Heart disease Brother        cardioversion for arrhythmia  . Diabetes Neg Hx   . Colon cancer Neg Hx     Social  History   Socioeconomic History  . Marital status: Married    Spouse name: Not on file  . Number of children: 2  . Years of education: Not on file  . Highest education level: Not on file  Occupational History  . Occupation: retired- part Electronics engineer  Tobacco Use  . Smoking status: Former Smoker    Packs/day: 1.00    Years: 35.00    Pack years: 35.00    Types: Cigarettes    Quit date: 12/16/2015    Years since quitting: 4.3  . Smokeless tobacco: Never Used  Substance and Sexual Activity  . Alcohol use: Yes    Alcohol/week: 0.0 standard drinks    Comment: occ  . Drug use: No  . Sexual  activity: Not on file  Other Topics Concern  . Not on file  Social History Narrative   Widowed   Remarried June 2014      Has living will   Wife is health care POA--- alternate is daughter, Magda Paganini   Would accept resuscitation    Probably no tube feeds if cognitively unaware.   Social Determinants of Health   Financial Resource Strain:   . Difficulty of Paying Living Expenses:   Food Insecurity:   . Worried About Charity fundraiser in the Last Year:   . Arboriculturist in the Last Year:   Transportation Needs:   . Film/video editor (Medical):   Marland Kitchen Lack of Transportation (Non-Medical):   Physical Activity:   . Days of Exercise per Week:   . Minutes of Exercise per Session:   Stress:   . Feeling of Stress :   Social Connections:   . Frequency of Communication with Friends and Family:   . Frequency of Social Gatherings with Friends and Family:   . Attends Religious Services:   . Active Member of Clubs or Organizations:   . Attends Archivist Meetings:   Marland Kitchen Marital Status:   Intimate Partner Violence:   . Fear of Current or Ex-Partner:   . Emotionally Abused:   Marland Kitchen Physically Abused:   . Sexually Abused:    Review of Systems Appetite is good Has gained back a bit of his weight Wears seat belt Teeth okay---keeps up with dentist Has moles--but nothing suspicious. Plans to see dermatologist No sig back or joint pains now--still sees chiropractor No heartburn or dysphagia Bowels are fine--no blood Voids well. Reasonable stream. Only occasional nocturia    Objective:   Physical Exam  Constitutional: He is oriented to person, place, and time. He appears well-developed. No distress.  HENT:  Mouth/Throat: Oropharynx is clear and moist.  No oral lesions  Neck: No thyromegaly present.  Cardiovascular: Normal rate, regular rhythm, normal heart sounds and intact distal pulses. Exam reveals no gallop.  No murmur heard. Respiratory: Effort normal and breath sounds  normal. No respiratory distress. He has no wheezes. He has no rales.  GI: Soft. There is no abdominal tenderness.  Musculoskeletal:        General: No tenderness.     Comments: Puffy feet and calves--but no pitting  Lymphadenopathy:    He has no cervical adenopathy.  Neurological: He is alert and oriented to person, place, and time.  President---"Biden, Trump, Obama" [id number] D-l-[id number]-w Recall 3/3   Skin: No rash noted. No erythema.  Multiple benign nevi (reinforced recommendation for derm visits)  Psychiatric: He has a normal mood and affect. His behavior is normal.  Assessment & Plan:

## 2020-04-24 NOTE — Assessment & Plan Note (Signed)
See social history 

## 2020-04-24 NOTE — Assessment & Plan Note (Signed)
Does well with the CPAP nightly 

## 2020-04-24 NOTE — Progress Notes (Signed)
Hearing Screening   Method: Audiometry   125Hz 250Hz 500Hz 1000Hz 2000Hz 3000Hz 4000Hz 6000Hz 8000Hz  Right ear:   20 20 20  20    Left ear:   20 20 20  20    Vision Screening Comments: March 2021   

## 2020-04-24 NOTE — Telephone Encounter (Signed)
Sent Referral over to [facility], their office called requesting that you order an updated CTA with and without contrast before they will schedule the consult for your patient. Please place the order for the CTA

## 2020-04-25 NOTE — Telephone Encounter (Signed)
Order placed To be done at [facility] seems to be the most convenient for him

## 2020-05-04 ENCOUNTER — Ambulatory Visit
Admission: RE | Admit: 2020-05-04 | Discharge: 2020-05-04 | Disposition: A | Discharge status: Home / Self Care | Payer: PPO | Source: Ambulatory Visit | Attending: Internal Medicine | Admitting: Internal Medicine

## 2020-05-04 ENCOUNTER — Other Ambulatory Visit: Payer: Self-pay

## 2020-05-04 DIAGNOSIS — I712 Thoracic aortic aneurysm, without rupture, unspecified: Secondary | ICD-10-CM

## 2020-05-04 MED ORDER — IOHEXOL 350 MG/ML SOLN
75.0000 mL | Freq: Once | INTRAVENOUS | Status: AC | PRN
  Administered 2020-05-04: 75 mL via INTRAVENOUS

## 2020-05-07 ENCOUNTER — Institutional Professional Consult (permissible substitution): Payer: PPO | Admitting: Cardiothoracic Surgery

## 2020-05-07 ENCOUNTER — Other Ambulatory Visit: Payer: Self-pay

## 2020-05-07 VITALS — BP 143/82 | HR 59 | Temp 98.1°F | Resp 20 | Ht 74.0 in | Wt 258.0 lb

## 2020-05-07 DIAGNOSIS — I712 Thoracic aortic aneurysm, without rupture, unspecified: Secondary | ICD-10-CM

## 2020-05-08 NOTE — Progress Notes (Signed)
[address]       [place] [address]             [phone]     CARDIOTHORACIC SURGERY CONSULTATION REPORT  Referring Provider is Venia Carbon, MD Primary Cardiologist is No primary care provider on file. PCP is Venia Carbon, MD  Chief Complaint  Patient presents with  . Thoracic Aortic Aneurysm    Consultation    HPI:  69 year old man is referred for evaluation of ascending aortic aneurysm.  He was recently evaluated for pneumonia.  He also has a history of pulmonary embolism.  He has known ascending aortic aneurysm from prior examination and the more recent examination was done as a follow-up.  The patient denies any personal history of aneurysm or family history of aneurysm.  He has no known familial syndromes.  Otherwise he is in good health.  He has undergone a hip replacement and is treated for severe sleep apnea.  He denies chest pain or overt shortness of breath  Past Medical History:  Diagnosis Date  . Arthritis    r hip  . 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 Diverticulitis    pt denies  . Hyperlipidemia   . Hypertension   . 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 OSA (obstructive sleep apnea)   . Pulmonary air embolism ([place]) 02/2018   lifetime eliquis since then   . Sleep apnea    CPAP    Past Surgical History:  Procedure Laterality Date  . BACK SURGERY    . COLONOSCOPY    . THORACENTESIS  08/2006   left pleural effusion  . 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 Hypertension Mother   . Heart disease Mother   . Kidney disease Mother   . Heart disease Father   . Stroke Father   . Prostate cancer Father   .  Kidney disease Father   . Heart disease Brother        cardioversion for arrhythmia  . Diabetes Neg Hx   . Colon cancer Neg Hx     Social History   Socioeconomic History  . Marital status: Married    Spouse name: Not on file  . Number of children: 2  . Years of education: Not on file  . Highest education level: Not on file  Occupational History  . Occupation: retired- part Electronics engineer  Tobacco Use  . Smoking status: Former Smoker    Packs/day: 1.00    Years: 35.00    Pack years: 35.00    Types: Cigarettes    Quit date: 12/16/2015    Years since quitting: 4.3  . Smokeless tobacco: Never Used  Vaping Use  . Vaping Use: Never used  Substance and Sexual Activity  . Alcohol use: Yes    Alcohol/week: 0.0 standard drinks    Comment: occ  . Drug use: No  . Sexual activity: Not on file  Other Topics Concern  . Not on file  Social History Narrative   Widowed   Remarried June 2014      Has living will  Wife is health care POA--- alternate is daughter, Magda Paganini   Would accept resuscitation    Probably no tube feeds if cognitively unaware.   Social Determinants of Health   Financial Resource Strain:   . Difficulty of Paying Living Expenses:   Food Insecurity:   . Worried About Charity fundraiser in the Last Year:   . Arboriculturist in the Last Year:   Transportation Needs:   . Film/video editor (Medical):   Marland Kitchen Lack of Transportation (Non-Medical):   Physical Activity:   . Days of Exercise per Week:   . Minutes of Exercise per Session:   Stress:   . Feeling of Stress :   Social Connections:   . Frequency of Communication with Friends and Family:   . Frequency of Social Gatherings with Friends and Family:   . Attends Religious Services:   . Active Member of Clubs or Organizations:   . Attends Archivist Meetings:   Marland Kitchen Marital Status:   Intimate Partner Violence:   . Fear of Current or Ex-Partner:   . Emotionally Abused:   Marland Kitchen Physically  Abused:   . Sexually Abused:     Current Outpatient Medications  Medication Sig Dispense Refill  . Ascorbic Acid (VITAMIN C) 1000 MG tablet Take 1,000 mg by mouth daily.    Marland Kitchen atorvastatin (LIPITOR) 40 MG tablet TAKE ONE TABLET BY MOUTH EVERY DAY 90 tablet 3  . ELIQUIS 5 MG TABS tablet TAKE ONE TABLET BY MOUTH TWICE DAILY 60 tablet 11  . hydrochlorothiazide (HYDRODIURIL) 12.5 MG tablet TAKE 1 TABLET BY MOUTH DAILY ALONG WITH THE LOSARTAN 50MG  (THE COMBINATION TABLET IS CURRENTLY UNAVAILABLE) 90 tablet 3  . losartan (COZAAR) 50 MG tablet TAKE 1 TABLET BY MOUTH DAILY ALONG WITH THE HCTZ 12.5MG  (THE COMBINATION TABLET IS CURRENTLY UNAVAILABLE) 90 tablet 3  . VITAMIN D PO Take by mouth.     No current facility-administered medications for this visit.    Not on File    Review of Systems:   General:  Good appetite, good energy, no weight change  Cardiac:  Hypertension   Respiratory:  Uses CPAP at night; history of PE  GI:   Negative  GU:   Negative  Vascular:  Positive DVT,  Neuro:   Denies  Musculoskeletal: Positive arthritis, status post THA  Skin:   Negative  Psych:   Negative  Eyes:    wears glasses or contacts  ENT:   Negative  Hematologic:   easy bruising on Eliquis,   Endocrine:  No diabetes, does not check CBG's at home     Physical Exam:   BP (!) 143/82 (BP Location: Left Arm, Patient Position: Sitting, Cuff Size: Normal)   Pulse (!) 59   Temp 98.1 F (36.7 C) (Temporal)   Resp 20   Ht 6\' 2"  (1.88 m)   Wt 117 kg   SpO2 96% Comment: RA  BMI 33.13 kg/m   General:   well-appearing  HEENT:  Unremarkable   Neck:   no JVD, no bruits, no adenopathy   Chest:   clear to auscultation, symmetrical breath sounds, no wheezes, no rhonchi   CV:   RRR, no  murmur   Abdomen:  soft, non-tender, no masses   Extremities:  warm, well-perfused, pulses intact, no LE edema  Rectal/GU  Deferred  Neuro:   Grossly non-focal and symmetrical throughout  Skin:   Clean and dry, no  rashes, no breakdown   Diagnostic Tests:  I have reviewed his available imaging studies including his most recent CT angio chest which demonstrated 4.8 cm ascending aortic aneurysm and a bovine aortic arch.   Impression:  69 year old gentleman with a roughly 5 cm ascending aortic aneurysm.  Based on historical imaging, this has been relatively stable for at least 10 years.  He has well-controlled hypertension and does not smoke.  Therefore his risk of imminent aortic catastrophe is considered very low.   Plan:  Follow-up in 1 year with repeat noncontrast CT examination of the chest   I spent in excess of 40 minutes during the conduct of this office consultation and >50% of this time involved direct face-to-face encounter with the patient for counseling and/or coordination of their care.          Level 3 Office Consult = 40 minutes         Level 4 Office Consult = 60 minutes         Level 5 Office Consult = 80 minutes B.  Murvin Natal, MD 05/08/2020 6:14 AM

## 2020-06-05 ENCOUNTER — Other Ambulatory Visit: Payer: Self-pay | Admitting: Internal Medicine

## 2020-08-16 ENCOUNTER — Encounter: Payer: Self-pay | Admitting: Adult Health

## 2020-08-16 ENCOUNTER — Other Ambulatory Visit: Payer: Self-pay

## 2020-08-16 ENCOUNTER — Ambulatory Visit: Payer: PPO | Admitting: Adult Health

## 2020-08-16 VITALS — BP 126/74 | HR 75 | Temp 97.3°F | Ht 74.0 in | Wt 268.0 lb

## 2020-08-16 DIAGNOSIS — E669 Obesity, unspecified: Secondary | ICD-10-CM

## 2020-08-16 DIAGNOSIS — G4733 Obstructive sleep apnea (adult) (pediatric): Secondary | ICD-10-CM | POA: Diagnosis not present

## 2020-08-16 DIAGNOSIS — Z23 Encounter for immunization: Secondary | ICD-10-CM

## 2020-08-16 DIAGNOSIS — Z6834 Body mass index (BMI) 34.0-34.9, adult: Secondary | ICD-10-CM | POA: Diagnosis not present

## 2020-08-16 NOTE — Progress Notes (Signed)
@Patient  ID: Russell Garrett, male    DOB: 1951/04/04, 69 y.o.   MRN: [id number]  Chief Complaint  Patient presents with   Follow-up    OSA     Referring provider: Venia Carbon, MD  HPI: 69 year old male followed for severe obstructive sleep apnea on nocturnal CPAP  TEST/EVENTS :  Split-night sleep study July 2019 showed severe sleep apnea with AHI 42.7/hour New CPAP machine 2019 .   08/16/2020 Follow up : OSA  Patient presents for a 1 year follow-up for severe obstructive sleep apnea.  Patient is maintained on nocturnal CPAP.  Patient says he wears his machine every single night.  Cannot sleep without it.  Feels rested with no significant daytime sleepiness. CPAP download shows excellent compliance with 100% usage.  Daily average usage at 8.5 hours.  Patient is on auto CPAP 10 to 20 cm H2O.  AHI 0.1.  Minimum leaks. Uses nasal mask  Has been on CPAP for 20 years, cant sleep without it .   Not on File  Immunization History  Administered Date(s) Administered   Fluad Quad(high Dose 65+) 07/27/2019   H1N1 02/09/2009   Influenza Split 08/06/2011, 08/18/2012, 10/09/2015   Influenza Whole 08/16/2010   Influenza,inj,Quad PF,6+ Mos 09/20/2013, 10/04/2016, 09/25/2018   Influenza,inj,quad, With Preservative 11/23/2017   Influenza-Unspecified 09/24/2016   PFIZER SARS-COV-2 Vaccination 01/07/2020, 01/31/2020   Pneumococcal Conjugate-13 03/17/2017   Pneumococcal Polysaccharide-23 03/19/2018   Pneumococcal-Unspecified 02/22/2013   Td 12/06/1996, 01/25/2008   Tdap 03/19/2018   Zoster 03/11/2013    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   Diverticulitis    pt denies   Hyperlipidemia    Hypertension    Mild ascending aorta dilatation (HCC)    4.8cm in diamter per CT chest, see imaging in epic dated 02-25-18   OSA (obstructive sleep apnea)    Pulmonary air embolism ([place])  02/2018   lifetime eliquis since then    Sleep apnea    CPAP    Tobacco History: Social History   Tobacco Use  Smoking Status Former Smoker   Packs/day: 1.00   Years: 35.00   Pack years: 35.00   Types: Cigarettes   Quit date: 12/16/2015   Years since quitting: 4.6  Smokeless Tobacco Never Used   Counseling given: Not Answered   Outpatient Medications Prior to Visit  Medication Sig Dispense Refill   Ascorbic Acid (VITAMIN C) 1000 MG tablet Take 1,000 mg by mouth daily.     atorvastatin (LIPITOR) 40 MG tablet TAKE ONE TABLET BY MOUTH EVERY DAY 90 tablet 3   ELIQUIS 5 MG TABS tablet TAKE ONE TABLET BY MOUTH TWICE DAILY 60 tablet 11   hydrochlorothiazide (HYDRODIURIL) 12.5 MG tablet TAKE 1 TABLET BY MOUTH DAILY ALONG WITH THE LOSARTAN 50MG  (THE COMBINATION TABLET IS CURRENTLY UNAVAILABLE) 90 tablet 3   losartan (COZAAR) 50 MG tablet TAKE 1 TABLET BY MOUTH DAILY ALONG WITH THE HCTZ 12.5MG  (THE COMBINATION TABLET IS CURRENTLY UNAVAILABLE) 90 tablet 3   VITAMIN D PO Take by mouth.     No facility-administered medications prior to visit.     Review of Systems:   Constitutional:   No  weight loss, night sweats,  Fevers, chills, fatigue, or  lassitude.  HEENT:   No headaches,  Difficulty swallowing,  Tooth/dental problems, or  Sore throat,  No sneezing, itching, ear ache, nasal congestion, post nasal drip,   CV:  No chest pain,  Orthopnea, PND, swelling in lower extremities, anasarca, dizziness, palpitations, syncope.   GI  No heartburn, indigestion, abdominal pain, nausea, vomiting, diarrhea, change in bowel habits, loss of appetite, bloody stools.   Resp: No shortness of breath with exertion or at rest.  No excess mucus, no productive cough,  No non-productive cough,  No coughing up of blood.  No change in color of mucus.  No wheezing.  No chest wall deformity  Skin: no rash or lesions.  GU: no dysuria, change in color of urine, no urgency or  frequency.  No flank pain, no hematuria   MS:  No joint pain or swelling.  No decreased range of motion.  No back pain.    Physical Exam  BP 126/74 (BP Location: Left Arm, Cuff Size: Normal)    Pulse 75    Temp (!) 97.3 F (36.3 C) (Temporal)    Ht 6\' 2"  (1.88 m)    Wt 268 lb (121.6 kg)    SpO2 97%    BMI 34.41 kg/m   GEN: A/Ox3; pleasant , NAD, well nourished    HEENT:  [place]/AT,   NOSE-clear, THROAT-clear, no lesions, no postnasal drip or exudate noted. Class 2 MP airway   NECK:  Supple w/ fair ROM; no JVD; normal carotid impulses w/o bruits; no thyromegaly or nodules palpated; no lymphadenopathy.    RESP  Clear  P & A; w/o, wheezes/ rales/ or rhonchi. no accessory muscle use, no dullness to percussion  CARD:  RRR, no m/r/g, tr  peripheral edema, pulses intact, no cyanosis or clubbing.  GI:   Soft & nt; nml bowel sounds; no organomegaly or masses detected.   Musco: Warm bil, no deformities or joint swelling noted.   Neuro: alert, no focal deficits noted.    Skin: Warm, no lesions or rashes    Lab Results:  BMET   BNP  ProBNP No results found for: PROBNP  Imaging: No results found.    No flowsheet data found.  No results found for: NITRICOXIDE      Assessment & Plan:   OBSTRUCTIVE SLEEP APNEA Excellent control and compliance on CPAP   Plan  Patient Instructions  Continue on CPAP at bedtime Keep up the good work Do not drive if sleepy Remain active and work on healthy weight  Flu shot  Follow-up in 1 year with Dr. Mortimer Fries and As needed       Obesity Healthy weight      Rexene Edison, NP 08/16/2020

## 2020-08-16 NOTE — Assessment & Plan Note (Signed)
Excellent control and compliance on CPAP   Plan  Patient Instructions  Continue on CPAP at bedtime Keep up the good work Do not drive if sleepy Remain active and work on healthy weight  Flu shot  Follow-up in 1 year with Dr. Mortimer Fries and As needed

## 2020-08-16 NOTE — Patient Instructions (Addendum)
Continue on CPAP at bedtime Keep up the good work Do not drive if sleepy Remain active and work on healthy weight  Flu shot  Follow-up in 1 year with Dr. Mortimer Fries and As needed

## 2020-08-16 NOTE — Assessment & Plan Note (Signed)
Healthy weight

## 2020-11-26 ENCOUNTER — Telehealth: Payer: Self-pay

## 2020-11-26 ENCOUNTER — Telehealth (INDEPENDENT_AMBULATORY_CARE_PROVIDER_SITE_OTHER): Payer: PPO | Admitting: Internal Medicine

## 2020-11-26 ENCOUNTER — Encounter: Payer: Self-pay | Admitting: Internal Medicine

## 2020-11-26 DIAGNOSIS — U071 COVID-19: Secondary | ICD-10-CM | POA: Diagnosis not present

## 2020-11-26 NOTE — Telephone Encounter (Signed)
I spoke with pts wife; (DPR signed) pt had home covid test and pt tested positive on 11/26/19. Pt has fever 100; pt took tylenol for fever; now pt has 97.6 and pt last took tylenol at 3 AM.initially pulse ox read 79%, then reck was 96%, the reck 90% - 89% - 93% - 89% -89% -91%. Checked BP 128/81 P 73 pulse ox 90%Chills over weekend, H/A, pt having slight upper abd pain that is dull like cramping over weekend; no abd pain now. Pt made statement over weekend he is achy all over like he has the flu.pt has dry cough, runny nose and S/T and when pt swallows throat hurts badly;no SOB and no CP and no dizziness. No diarrhea and no loss of taste or smell. No vomiting. No distress in breathing now. Now BP 128/81 P 73 pulse ox now is 90%.per pts problem list pt has hx of PE. Pt does not feel he needs to go to ED or UC. Pamala Hurry NP said will do video visit but if pulse ox is consistently below 90% or CP or SOB pt will go to ED. Sending note to Dr Alphonsus Sias as PCP and Pamala Hurry NP. Pt also encouraged to drink plenty of liquids, rest, tylenol for fever and self quarantining 10 days from 11/26/19.

## 2020-11-26 NOTE — Progress Notes (Signed)
Virtual Visit via Video Note  I connected with Russell Garrett on 11/26/20 at 11:00 AM EST by a video enabled telemedicine application and verified that I am speaking with the correct person using two identifiers.  Location: Patient: Home Provider: Office  Person's participating in this video call: Webb Silversmith, NP-C and Fanny Bien.   I discussed the limitations of evaluation and management by telemedicine and the availability of in person appointments. The patient expressed understanding and agreed to proceed.  History of Present Illness:  Pt reports headache, runny nose, sore throat, cough and fever. This started 3 days ago. He is blowing clear mucous out of his nose. He denies difficulty swallowing. The cough is dry and nonproductive. He denies nasal congestion, ear pain, loss of taste/smell. He ran a fever last night but denies chills or body aches. He has taken Tylenol and Nyquil with some relief of symptoms. He did test positive for Covid yesterday.    Past Medical History:  Diagnosis Date  . Arthritis    r hip  . 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 Diverticulitis    pt denies  . Hyperlipidemia   . Hypertension   . 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 OSA (obstructive sleep apnea)   . Pulmonary air embolism ([place]) 02/2018   lifetime eliquis since then   . Sleep apnea    CPAP    Current Outpatient Medications  Medication Sig Dispense Refill  . Ascorbic Acid (VITAMIN C) 1000 MG tablet Take 1,000 mg by mouth daily.    Marland Kitchen atorvastatin (LIPITOR) 40 MG tablet TAKE ONE TABLET BY MOUTH EVERY DAY 90 tablet 3  . ELIQUIS 5 MG TABS tablet TAKE ONE TABLET BY MOUTH TWICE DAILY 60 tablet 11  . hydrochlorothiazide (HYDRODIURIL) 12.5 MG tablet TAKE 1 TABLET BY MOUTH DAILY ALONG WITH THE LOSARTAN 50MG  (THE COMBINATION TABLET IS CURRENTLY UNAVAILABLE) 90 tablet 3  . losartan (COZAAR) 50 MG tablet  TAKE 1 TABLET BY MOUTH DAILY ALONG WITH THE HCTZ 12.5MG  (THE COMBINATION TABLET IS CURRENTLY UNAVAILABLE) 90 tablet 3  . VITAMIN D PO Take by mouth.     No current facility-administered medications for this visit.    No Known Allergies  Family History  Problem Relation Age of Onset  . Hypertension Mother   . Heart disease Mother   . Kidney disease Mother   . Heart disease Father   . Stroke Father   . Prostate cancer Father   . Kidney disease Father   . Heart disease Brother        cardioversion for arrhythmia  . Diabetes Neg Hx   . Colon cancer Neg Hx     Social History   Socioeconomic History  . Marital status: Married    Spouse name: Not on file  . Number of children: 2  . Years of education: Not on file  . Highest education level: Not on file  Occupational History  . Occupation: retired- part Electronics engineer  Tobacco Use  . Smoking status: Former Smoker    Packs/day: 1.00    Years: 35.00    Pack years: 35.00    Types: Cigarettes    Quit date: 12/16/2015    Years since quitting: 4.9  . Smokeless tobacco: Never Used  Vaping Use  . Vaping Use: Never used  Substance and Sexual Activity  .  Alcohol use: Yes    Alcohol/week: 0.0 standard drinks    Comment: occ  . Drug use: No  . Sexual activity: Not on file  Other Topics Concern  . Not on file  Social History Narrative   Widowed   Remarried June 2014      Has living will   Wife is health care POA--- alternate is daughter, Verlon Au   Would accept resuscitation    Probably no tube feeds if cognitively unaware.   Social Determinants of Health   Financial Resource Strain: Not on file  Food Insecurity: Not on file  Transportation Needs: Not on file  Physical Activity: Not on file  Stress: Not on file  Social Connections: Not on file  Intimate Partner Violence: Not on file     Constitutional: Pt reports headache, fever. Denies malaise, fatigue, or abrupt weight changes.  HEENT: Pt reports runny nose,  sore throat. Denies eye pain, eye redness, ear pain, ringing in the ears, wax buildup, runny nose, nasal congestion, bloody nose. Respiratory: Pt reports cough. Denies difficulty breathing, shortness of breath, or sputum production.   Cardiovascular: Denies chest pain, chest tightness, palpitations or swelling in the hands or feet.   No other specific complaints in a complete review of systems (except as listed in HPI above).   Observations/Objective:  Wt Readings from Last 3 Encounters:  08/16/20 268 lb (121.6 kg)  05/07/20 258 lb (117 kg)  04/24/20 258 lb (117 kg)    General: Appears his stated age, in NAD. HEENT: Head: normal shape and size; Nose: no congestion noted; Throat/Mouth: hoarseness noted. Pulmonary/Chest: Normal effort. No respiratory distress.  Neurological: Alert and oriented.   BMET    Component Value Date/Time   NA 141 04/24/2020 0912   K 4.3 04/24/2020 0912   CL 104 04/24/2020 0912   CO2 32 04/24/2020 0912   GLUCOSE 99 04/24/2020 0912   BUN 22 04/24/2020 0912   CREATININE 1.03 04/24/2020 0912   CALCIUM 9.2 04/24/2020 0912   GFRNONAA >60 09/28/2018 0419   GFRAA >60 09/28/2018 0419    Lipid Panel     Component Value Date/Time   CHOL 151 04/24/2020 0912   TRIG 52.0 04/24/2020 0912   HDL 54.60 04/24/2020 0912   CHOLHDL 3 04/24/2020 0912   VLDL 10.4 04/24/2020 0912   LDLCALC 86 04/24/2020 0912    CBC    Component Value Date/Time   WBC 3.9 (L) 04/24/2020 0912   RBC 4.69 04/24/2020 0912   HGB 14.4 04/24/2020 0912   HCT 43.2 04/24/2020 0912   PLT 145.0 (L) 04/24/2020 0912   MCV 92.1 04/24/2020 0912   MCH 29.2 09/28/2018 0419   MCHC 33.3 04/24/2020 0912   RDW 13.5 04/24/2020 0912   LYMPHSABS 1.3 03/17/2017 1036   MONOABS 0.5 03/17/2017 1036   EOSABS 0.3 03/17/2017 1036   BASOSABS 0.1 03/17/2017 1036    Hgb A1C No results found for: HGBA1C      Assessment and Plan:  Covid 19:  He feels like he is overall better He is monitoring  oxygen sats- currently 96 Continue Tylenol for fever and sore throat No indication for steroid, antibiotics or inhalers at this time He declines RX for cough syrup Encouraged masking, frequent handwashing, social distancing and self quarantine until symptoms resolve.  Return/ER precautions discussed Follow Up Instructions:    I discussed the assessment and treatment plan with the patient. The patient was provided an opportunity to ask questions and all were answered. The patient agreed  with the plan and demonstrated an understanding of the instructions.   The patient was advised to call back or seek an in-person evaluation if the symptoms worsen or if the condition fails to improve as anticipated.   Webb Silversmith, NP

## 2020-11-26 NOTE — Telephone Encounter (Signed)
[facility] Day - Client TELEPHONE ADVICE RECORD [facility] Patient Name: Russell Garrett Gender: Male DOB: 1951-10-15 Age: 70 Y 5 M 5 D Return Phone Number: [phone] (Primary) Address: City/State/Zip:  [place] [address] Client [facility] Day - Client Client Site [facility] - Day Physician AA - PHYSICIAN, Crissie Figures- MD Contact Type Call Who Is Calling Patient / Member / Family / Caregiver Call Type Triage / Clinical Caller Name Russell Garrett Relationship To Patient Other Return Phone Number [phone] (Primary) Chief Complaint Weakness, Generalized Reason for Call Symptomatic / Request for Health Information Initial Comment Caller states, positive for Covid. OT states are dropping, now got them up to 88-99. Fever normal now . Got it down with medication. Got booster on Thurday. Friday chills, fever. Translation No Disp. Time Lamount Cohen Time) Disposition Final User 11/26/2020 8:51:33 AM Attempt made - message left Baxter, RN, Gregary Signs 11/26/2020 9:06:12 AM FINAL ATTEMPT MADE - message left Yes Baxter, RN, Gregary Signs Comments User: Lorraine Lax, RN Date/Time Lamount Cohen Time): 11/26/2020 8:52:13 AM Caller answered phone, requested this nurse to hold then disconnected call. Will try call again.

## 2020-11-26 NOTE — Telephone Encounter (Signed)
[facility] Night - Client TELEPHONE ADVICE RECORD [facility] Patient Name: Russell Garrett Gender: Male DOB: 12-30-1950 Age: 70 Y 5 M 4 D Return Phone Number: [phone] (Primary) Address: City/State/Zip: [place] [address] Client [name] Night - Client Client Site [facility] - Night Physician Tillman Abide- MD Contact Type Call Who Is Calling Patient / Member / Family / Caregiver Call Type Triage / Clinical Caller Name Russell Garrett Relationship To Patient Spouse Return Phone Number [phone] (Primary) Chief Complaint Abdominal Pain Reason for Call Symptomatic / Request for Health Information Initial Comment RECORD 2 of 2 Caller and her husband have tested positive for covid. How can they get the tx? He is c/o cough, body aches and abd pain, he says may be from coughing Translation No Nurse Assessment Nurse: Letitia Libra, RN, Shaun Date/Time (Eastern Time): 11/25/2020 11:35:10 AM Confirm and document reason for call. If symptomatic, describe symptoms. ---Caller and her husband have tested positive for covid. How can they get the tx? He is c/o cough, body aches and abd pain, he says may be from coughing. Chills a couple days ago. No fever now. No other symptoms. Does the patient have any new or worsening symptoms? ---Yes Will a triage be completed? ---Yes Related visit to physician within the last 2 weeks? ---No Does the PT have any chronic conditions? (i.e. diabetes, asthma, this includes High risk factors for pregnancy, etc.) ---Yes List chronic conditions. ---PE two years ago. Former smoker. Is this a behavioral health or substance abuse call? ---No Guidelines Guideline Title Affirmed Question Affirmed Notes Nurse Date/Time (Eastern Time) COVID-19 - Diagnosed or Suspected HIGH RISK for severe COVID complications (e.g., age > 64 years, obesity with BMI > 25, pregnant, chronic lung disease or  other chronic medical condition) (Exception: Already seen Letitia Libra, RN, Shaun 11/25/2020 11:37:43 AM PLEASE NOTE: All timestamps contained within this report are represented as [place] Standard Time. CONFIDENTIALTY NOTICE: This fax transmission is intended only for the addressee. It contains information that is legally privileged, confidential or otherwise protected from use or disclosure. If you are not the intended recipient, you are strictly prohibited from reviewing, disclosing, copying using or disseminating any of this information or taking any action in reliance on or regarding this information. If you have received this fax in error, please notify us immediately by telephone so that we can arrange for its return to [place]. Phone: [phone], Toll-Free: [phone], Fax: [phone] Page: 2 of 2 Call Id: [id number] Guidelines Guideline Title Affirmed Question Affirmed Notes Nurse Date/Time Lamount Cohen Time) by PCP and no new or worsening symptoms.) Disp. Time Lamount Cohen Time) Disposition Final User 11/25/2020 11:20:09 AM Send To RN Personal Cox, RN, Allicon 11/25/2020 11:43:38 AM Call PCP Now Yes Letitia Libra, RN, Shaun Caller Disagree/Comply Comply Caller Understands Yes PreDisposition Did not know what to do Care Advice Given Per Guideline CALL PCP NOW: * You need to discuss this with your doctor (or NP/PA). GENERAL CARE ADVICE FOR COVID-19 SYMPTOMS: * The treatment is the same whether you have COVID-19, influenza or some other respiratory virus. CALL BACK IF: * You become worse CARE ADVICE given per COVID-19 - DIAGNOSED OR SUSPECTED (Adult) guideline. After Care Instructions Given Call Event Type User Date / Time Description Education document email Bobetta Lime, Sidonie Dickens 11/25/2020 11:45:35 AM COVID-19 Diagnosed or Suspected Education document email Letitia Libra, RN, Shaun 11/25/2020 11:45:35 AM What To Do If You Are Sick With COVID-19_English

## 2020-11-26 NOTE — Telephone Encounter (Signed)
See virtual visit today

## 2020-11-26 NOTE — Patient Instructions (Signed)
COVID-19 COVID-19 is a respiratory infection that is caused by a virus called severe acute respiratory syndrome coronavirus 2 (SARS-CoV-2). The disease is also known as coronavirus disease or novel coronavirus. In some people, the virus may not cause any symptoms. In others, it may cause a serious infection. The infection can get worse quickly and can lead to complications, such as:  Pneumonia, or infection of the lungs.  Acute respiratory distress syndrome or ARDS. This is a condition in which fluid build-up in the lungs prevents the lungs from filling with air and passing oxygen into the blood.  Acute respiratory failure. This is a condition in which there is not enough oxygen passing from the lungs to the body or when carbon dioxide is not passing from the lungs out of the body.  Sepsis or septic shock. This is a serious bodily reaction to an infection.  Blood clotting problems.  Secondary infections due to bacteria or fungus.  Organ failure. This is when your body's organs stop working. The virus that causes COVID-19 is contagious. This means that it can spread from person to person through droplets from coughs and sneezes (respiratory secretions). What are the causes? This illness is caused by a virus. You may catch the virus by:  Breathing in droplets from an infected person. Droplets can be spread by a person breathing, speaking, singing, coughing, or sneezing.  Touching something, like a table or a doorknob, that was exposed to the virus (contaminated) and then touching your mouth, nose, or eyes. What increases the risk? Risk for infection You are more likely to be infected with this virus if you:  Are within 6 feet (2 meters) of a person with COVID-19.  Provide care for or live with a person who is infected with COVID-19.  Spend time in crowded indoor spaces or live in shared housing. Risk for serious illness You are more likely to become seriously ill from the virus if you:   Are 50 years of age or older. The higher your age, the more you are at risk for serious illness.  Live in a nursing home or long-term care facility.  Have cancer.  Have a long-term (chronic) disease such as: ? Chronic lung disease, including chronic obstructive pulmonary disease or asthma. ? A long-term disease that lowers your body's ability to fight infection (immunocompromised). ? Heart disease, including heart failure, a condition in which the arteries that lead to the heart become narrow or blocked (coronary artery disease), a disease which makes the heart muscle thick, weak, or stiff (cardiomyopathy). ? Diabetes. ? Chronic kidney disease. ? Sickle cell disease, a condition in which red blood cells have an abnormal "sickle" shape. ? Liver disease.  Are obese. What are the signs or symptoms? Symptoms of this condition can range from mild to severe. Symptoms may appear any time from 2 to 14 days after being exposed to the virus. They include:  A fever or chills.  A cough.  Difficulty breathing.  Headaches, body aches, or muscle aches.  Runny or stuffy (congested) nose.  A sore throat.  New loss of taste or smell. Some people may also have stomach problems, such as nausea, vomiting, or diarrhea. Other people may not have any symptoms of COVID-19. How is this diagnosed? This condition may be diagnosed based on:  Your signs and symptoms, especially if: ? You live in an area with a COVID-19 outbreak. ? You recently traveled to or from an area where the virus is common. ? You   provide care for or live with a person who was diagnosed with COVID-19. ? You were exposed to a person who was diagnosed with COVID-19.  A physical exam.  Lab tests, which may include: ? Taking a sample of fluid from the back of your nose and throat (nasopharyngeal fluid), your nose, or your throat using a swab. ? A sample of mucus from your lungs (sputum). ? Blood tests.  Imaging tests, which  may include, X-rays, CT scan, or ultrasound. How is this treated? At present, there is no medicine to treat COVID-19. Medicines that treat other diseases are being used on a trial basis to see if they are effective against COVID-19. Your health care provider will talk with you about ways to treat your symptoms. For most people, the infection is mild and can be managed at home with rest, fluids, and over-the-counter medicines. Treatment for a serious infection usually takes places in a hospital intensive care unit (ICU). It may include one or more of the following treatments. These treatments are given until your symptoms improve.  Receiving fluids and medicines through an IV.  Supplemental oxygen. Extra oxygen is given through a tube in the nose, a face mask, or a hood.  Positioning you to lie on your stomach (prone position). This makes it easier for oxygen to get into the lungs.  Continuous positive airway pressure (CPAP) or bi-level positive airway pressure (BPAP) machine. This treatment uses mild air pressure to keep the airways open. A tube that is connected to a motor delivers oxygen to the body.  Ventilator. This treatment moves air into and out of the lungs by using a tube that is placed in your windpipe.  Tracheostomy. This is a procedure to create a hole in the neck so that a breathing tube can be inserted.  Extracorporeal membrane oxygenation (ECMO). This procedure gives the lungs a chance to recover by taking over the functions of the heart and lungs. It supplies oxygen to the body and removes carbon dioxide. Follow these instructions at home: Lifestyle  If you are sick, stay home except to get medical care. Your health care provider will tell you how long to stay home. Call your health care provider before you go for medical care.  Rest at home as told by your health care provider.  Do not use any products that contain nicotine or tobacco, such as cigarettes, e-cigarettes, and  chewing tobacco. If you need help quitting, ask your health care provider.  Return to your normal activities as told by your health care provider. Ask your health care provider what activities are safe for you. General instructions  Take over-the-counter and prescription medicines only as told by your health care provider.  Drink enough fluid to keep your urine pale yellow.  Keep all follow-up visits as told by your health care provider. This is important. How is this prevented?  There is no vaccine to help prevent COVID-19 infection. However, there are steps you can take to protect yourself and others from this virus. To protect yourself:   Do not travel to areas where COVID-19 is a risk. The areas where COVID-19 is reported change often. To identify high-risk areas and travel restrictions, check the CDC travel website: wwwnc.cdc.gov/travel/notices  If you live in, or must travel to, an area where COVID-19 is a risk, take precautions to avoid infection. ? Stay away from people who are sick. ? Wash your hands often with soap and water for 20 seconds. If soap and water   are not available, use an alcohol-based hand sanitizer. ? Avoid touching your mouth, face, eyes, or nose. ? Avoid going out in public, follow guidance from your state and local health authorities. ? If you must go out in public, wear a cloth face covering or face mask. Make sure your mask covers your nose and mouth. ? Avoid crowded indoor spaces. Stay at least 6 feet (2 meters) away from others. ? Disinfect objects and surfaces that are frequently touched every day. This may include:  Counters and tables.  Doorknobs and light switches.  Sinks and faucets.  Electronics, such as phones, remote controls, keyboards, computers, and tablets. To protect others: If you have symptoms of COVID-19, take steps to prevent the virus from spreading to others.  If you think you have a COVID-19 infection, contact your health care  provider right away. Tell your health care team that you think you may have a COVID-19 infection.  Stay home. Leave your house only to seek medical care. Do not use public transport.  Do not travel while you are sick.  Wash your hands often with soap and water for 20 seconds. If soap and water are not available, use alcohol-based hand sanitizer.  Stay away from other members of your household. Let healthy household members care for children and pets, if possible. If you have to care for children or pets, wash your hands often and wear a mask. If possible, stay in your own room, separate from others. Use a different bathroom.  Make sure that all people in your household wash their hands well and often.  Cough or sneeze into a tissue or your sleeve or elbow. Do not cough or sneeze into your hand or into the air.  Wear a cloth face covering or face mask. Make sure your mask covers your nose and mouth. Where to find more information  Centers for Disease Control and Prevention: www.cdc.gov/coronavirus/2019-ncov/index.html  World Health Organization: www.who.int/health-topics/coronavirus Contact a health care provider if:  You live in or have traveled to an area where COVID-19 is a risk and you have symptoms of the infection.  You have had contact with someone who has COVID-19 and you have symptoms of the infection. Get help right away if:  You have trouble breathing.  You have pain or pressure in your chest.  You have confusion.  You have bluish lips and fingernails.  You have difficulty waking from sleep.  You have symptoms that get worse. These symptoms may represent a serious problem that is an emergency. Do not wait to see if the symptoms will go away. Get medical help right away. Call your local emergency services (911 in the [place] Do not drive yourself to the hospital. Let the emergency medical personnel know if you think you have COVID-19. Summary  COVID-19 is a  respiratory infection that is caused by a virus. It is also known as coronavirus disease or novel coronavirus. It can cause serious infections, such as pneumonia, acute respiratory distress syndrome, acute respiratory failure, or sepsis.  The virus that causes COVID-19 is contagious. This means that it can spread from person to person through droplets from breathing, speaking, singing, coughing, or sneezing.  You are more likely to develop a serious illness if you are 50 years of age or older, have a weak immune system, live in a nursing home, or have chronic disease.  There is no medicine to treat COVID-19. Your health care provider will talk with you about ways to treat your symptoms.    Take steps to protect yourself and others from infection. Wash your hands often and disinfect objects and surfaces that are frequently touched every day. Stay away from people who are sick and wear a mask if you are sick. This information is not intended to replace advice given to you by your health care provider. Make sure you discuss any questions you have with your health care provider. Document Revised: 09/09/2019 Document Reviewed: 12/16/2018 Elsevier Patient Education  2020 [employer]  

## 2020-11-26 NOTE — Telephone Encounter (Signed)
Will discuss at upcoming appt.

## 2020-11-28 ENCOUNTER — Emergency Department: Payer: PPO

## 2020-11-28 ENCOUNTER — Other Ambulatory Visit: Payer: Self-pay

## 2020-11-28 ENCOUNTER — Emergency Department
Admission: EM | Admit: 2020-11-28 | Discharge: 2020-11-28 | Disposition: A | Discharge status: Home / Self Care | Payer: PPO | Attending: Emergency Medicine | Admitting: Emergency Medicine

## 2020-11-28 ENCOUNTER — Telehealth: Payer: Self-pay | Admitting: Internal Medicine

## 2020-11-28 DIAGNOSIS — S0990XA Unspecified injury of head, initial encounter: Secondary | ICD-10-CM

## 2020-11-28 DIAGNOSIS — S022XXA Fracture of nasal bones, initial encounter for closed fracture: Secondary | ICD-10-CM | POA: Diagnosis not present

## 2020-11-28 DIAGNOSIS — Z79899 Other long term (current) drug therapy: Secondary | ICD-10-CM | POA: Diagnosis not present

## 2020-11-28 DIAGNOSIS — Z96641 Presence of right artificial hip joint: Secondary | ICD-10-CM | POA: Insufficient documentation

## 2020-11-28 DIAGNOSIS — R55 Syncope and collapse: Secondary | ICD-10-CM | POA: Diagnosis not present

## 2020-11-28 DIAGNOSIS — I10 Essential (primary) hypertension: Secondary | ICD-10-CM | POA: Insufficient documentation

## 2020-11-28 DIAGNOSIS — W06XXXA Fall from bed, initial encounter: Secondary | ICD-10-CM | POA: Diagnosis not present

## 2020-11-28 DIAGNOSIS — Z87891 Personal history of nicotine dependence: Secondary | ICD-10-CM | POA: Insufficient documentation

## 2020-11-28 DIAGNOSIS — Z7901 Long term (current) use of anticoagulants: Secondary | ICD-10-CM | POA: Diagnosis not present

## 2020-11-28 DIAGNOSIS — G319 Degenerative disease of nervous system, unspecified: Secondary | ICD-10-CM | POA: Diagnosis not present

## 2020-11-28 DIAGNOSIS — R220 Localized swelling, mass and lump, head: Secondary | ICD-10-CM | POA: Diagnosis not present

## 2020-11-28 DIAGNOSIS — Y92003 Bedroom of unspecified non-institutional (private) residence as the place of occurrence of the external cause: Secondary | ICD-10-CM | POA: Diagnosis not present

## 2020-11-28 DIAGNOSIS — W19XXXA Unspecified fall, initial encounter: Secondary | ICD-10-CM | POA: Diagnosis not present

## 2020-11-28 DIAGNOSIS — R0902 Hypoxemia: Secondary | ICD-10-CM | POA: Diagnosis not present

## 2020-11-28 LAB — CBC
HCT: 44.1 % (ref 39.0–52.0)
Hemoglobin: 14.9 g/dL (ref 13.0–17.0)
MCH: 30.7 pg (ref 26.0–34.0)
MCHC: 33.8 g/dL (ref 30.0–36.0)
MCV: 90.9 fL (ref 80.0–100.0)
Platelets: 125 10*3/uL — ABNORMAL LOW (ref 150–400)
RBC: 4.85 MIL/uL (ref 4.22–5.81)
RDW: 12.9 % (ref 11.5–15.5)
WBC: 3.3 10*3/uL — ABNORMAL LOW (ref 4.0–10.5)
nRBC: 0 % (ref 0.0–0.2)

## 2020-11-28 LAB — BASIC METABOLIC PANEL
Anion gap: 11 (ref 5–15)
BUN: 26 mg/dL — ABNORMAL HIGH (ref 8–23)
CO2: 26 mmol/L (ref 22–32)
Calcium: 8.8 mg/dL — ABNORMAL LOW (ref 8.9–10.3)
Chloride: 98 mmol/L (ref 98–111)
Creatinine, Ser: 1.27 mg/dL — ABNORMAL HIGH (ref 0.61–1.24)
GFR, Estimated: >60 mL/min (ref >60)
Glucose, Bld: 137 mg/dL — ABNORMAL HIGH (ref 70–99)
Potassium: 4 mmol/L (ref 3.5–5.1)
Sodium: 135 mmol/L (ref 135–145)

## 2020-11-28 MED ORDER — ONDANSETRON 4 MG PO TBDP
4.0000 mg | ORAL_TABLET | Freq: Once | ORAL | Status: AC
  Administered 2020-11-28: 4 mg via ORAL
  Filled 2020-11-28: qty 1

## 2020-11-28 NOTE — ED Provider Notes (Signed)
[facility] Emergency Department Provider Note ____________________________________________   Event Date/Time   First MD Initiated Contact with Patient 11/28/20 1555     (approximate)  I have reviewed the triage vital signs and the nursing notes.   HISTORY  Chief Complaint Fall and Weakness    HPI Russell Garrett is a 70 y.o. male with PMH as noted below including pulmonary embolism on Eliquis who presents with a head injury, acute onset this morning when the patient got up from bed to go to the bathroom, got lightheaded, sat down on the bed, and then apparently fell.  He believes he may have briefly lost consciousness, causing him to fall.  He states that he now feels back to his baseline.  He denies any other injuries.  He was diagnosed with Covid last week and states that he has been having improving symptoms, but still has a decreased appetite and has not really been eating much over the last few days.  Past Medical History:  Diagnosis Date  . Arthritis    r hip  . 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 Diverticulitis    pt denies  . Hyperlipidemia   . Hypertension   . 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 OSA (obstructive sleep apnea)   . Pulmonary air embolism ([place]) 02/2018   lifetime eliquis since then   . Sleep apnea    CPAP    Patient Active Problem List   Diagnosis Date Noted  . 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 Obesity 03/19/2018  . Advance directive discussed with patient 03/19/2018  . Pulmonary embolism and infarction ([place]) 03/04/2018  . Thoracic aortic aneurysm without rupture ([place]) 03/04/2018  . Aortic atherosclerosis ([place]) 03/04/2018  . Routine general medical examination at a health care facility 02/16/2012  . OBSTRUCTIVE SLEEP APNEA 08/01/2008  . Hyperlipemia 03/09/2007  . Essential hypertension, benign 03/09/2007  . DIVERTICULOSIS,  COLON 03/09/2007    Past Surgical History:  Procedure Laterality Date  . BACK SURGERY    . COLONOSCOPY    . THORACENTESIS  08/2006   left pleural effusion  . 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 Authorizing Provider  Ascorbic Acid (VITAMIN C) 1000 MG tablet Take 1,000 mg by mouth daily.    [provider]  atorvastatin (LIPITOR) 40 MG tablet TAKE ONE TABLET BY MOUTH EVERY DAY 04/03/20   Venia Carbon, MD  ELIQUIS 5 MG TABS tablet TAKE ONE TABLET BY MOUTH TWICE DAILY 04/04/20   Viviana Simpler I, MD  hydrochlorothiazide (HYDRODIURIL) 12.5 MG tablet TAKE 1 TABLET BY MOUTH DAILY ALONG WITH THE LOSARTAN 50MG  (THE COMBINATION TABLET IS CURRENTLY UNAVAILABLE) 06/05/20   Venia Carbon, MD  losartan (COZAAR) 50 MG tablet TAKE 1 TABLET BY MOUTH DAILY ALONG WITH THE HCTZ 12.5MG  (THE COMBINATION TABLET IS CURRENTLY UNAVAILABLE) 06/05/20   Venia Carbon, MD  VITAMIN D PO Take by mouth.    [provider]  lisinopril-hydrochlorothiazide (PRINZIDE,ZESTORETIC) 10-12.5 MG per tablet Take 1 tablet by mouth daily. 03/26/11 02/16/12  Venia Carbon, MD    Allergies Patient has no known allergies.  Family History  Problem Relation Age of Onset  . Hypertension Mother   . Heart disease Mother   . Kidney disease Mother   . Heart disease Father   . Stroke Father   . Prostate cancer Father   . Kidney disease Father   . Heart disease Brother        cardioversion for arrhythmia  . Diabetes Neg Hx   . Colon cancer Neg Hx     Social History Social History   Tobacco Use  . Smoking status: Former Smoker    Packs/day: 1.00    Years: 35.00    Pack years: 35.00    Types: Cigarettes    Quit date: 12/16/2015    Years since quitting: 4.9   . Smokeless tobacco: Never Used  Vaping Use  . Vaping Use: Never used  Substance Use Topics  . Alcohol use: Yes    Alcohol/week: 0.0 standard drinks    Comment: occ  . Drug use: No    Review of Systems  Constitutional: No fever. Eyes: No redness. ENT: No neck pain. Cardiovascular: Denies chest pain. Respiratory: Denies shortness of breath. Gastrointestinal: No vomiting. Genitourinary: Negative for dysuria.  Musculoskeletal: Negative for back pain. Skin: Negative for rash. Neurological: Negative for headaches, focal weakness or numbness.   ____________________________________________   PHYSICAL EXAM:  VITAL SIGNS: ED Triage Vitals  Enc Vitals Group     BP 11/28/20 1244 107/73     Pulse Rate 11/28/20 1244 83     Resp 11/28/20 1244 18     Temp 11/28/20 1244 98.4 F (36.9 C)     Temp Source 11/28/20 1244 Oral     SpO2 11/28/20 1244 95 %     Weight 11/28/20 1245 265 lb (120.2 kg)     Height 11/28/20 1245 6\' 2"  (1.88 m)     Head Circumference --      Peak Flow --      Pain Score 11/28/20 1244 5     Pain Loc --      Pain Edu? --      Excl. in [place]? --     Constitutional: Alert and oriented. Well appearing and in no acute distress. Eyes: Conjunctivae are normal.  Head: 1 cm superficial abrasion to nasal bridge. Nose: No congestion/rhinnorhea. Mouth/Throat: Mucous membranes are moist.   Neck: Normal range of motion.  No midline cervical spinal tenderness. Cardiovascular: Normal rate, regular rhythm.  Good peripheral circulation. Respiratory: Normal respiratory effort.  No retractions. Gastrointestinal:  No distention.  Musculoskeletal: Extremities warm and well perfused.  Neurologic:  Normal speech and language.  No facial droop.  5/5 motor strength and intact sensation all extremities.  Normal for the patient with no ataxia on finger-to-nose.  No pronator drift.   Skin:  Skin is warm and dry. No rash noted. Psychiatric: Mood and affect are normal. Speech and  behavior are normal.  ____________________________________________   LABS (all labs ordered are listed, but only abnormal results are displayed)  Labs Reviewed  BASIC METABOLIC PANEL - Abnormal; Notable for the following components:      Result Value   Glucose, Bld 137 (*)    BUN 26 (*)    Creatinine, Ser 1.27 (*)    Calcium 8.8 (*)    All other components within normal limits  CBC - Abnormal; Notable for the following components:   WBC 3.3 (*)    Platelets 125 (*)    All other components within normal limits  URINALYSIS, COMPLETE (UACMP) WITH MICROSCOPIC  CBG MONITORING, ED   ____________________________________________  EKG  ED ECG REPORT I, Dionne Bucy, the attending physician, personally viewed and interpreted this ECG.  Date: 11/28/2020 EKG Time: 1247 Rate: 83 Rhythm: normal sinus rhythm QRS Axis: Left axis Intervals: normal ST/T Wave abnormalities: normal Narrative Interpretation: no evidence of acute ischemia  ____________________________________________  RADIOLOGY  CT head: Possible mildly displaced nasal bone fracture.  No ICH or other acute abnormality ____________________________________________   PROCEDURES  Procedure(s) performed: No  Procedures  Critical Care performed: No ____________________________________________   INITIAL IMPRESSION / ASSESSMENT AND PLAN / ED COURSE  Pertinent labs & imaging results that were available during my care of the patient were reviewed by me and considered in my medical decision making (see chart for details).  70 year old male with PMH as noted above including history of PE on Eliquis presents after an episode of apparent syncope or near syncope when he got up early this morning to use the bathroom.  He hit his head and sustained an abrasion to his nasal bridge.  On exam, the patient is well-appearing.  His vital signs are normal.  Physical exam is unremarkable except for mild tenderness and abrasion  to the nasal bridge.  Neurologic exam is normal.  CT head was obtained from triage and shows a possible minimally displaced nasal bone fracture but no other acute abnormalities.  EKG shows no ischemic changes or signs of arrhythmia, and basic labs are baseline for the patient.  Overall presentation is consistent with possible vasovagal syncope, especially in the context of having stood up quickly from bed.  The patient was recently diagnosed with COVID-19 and states his symptoms are improving but he has not had a great appetite over the last few days.  At this time, there is no evidence of cardiac or CNS cause.  The patient feels well and would like to go home.  I feel that discharge is reasonable.  He agrees to follow-up with his primary care doctor.  I gave him thorough return precautions and he expresses understanding.  ____________________________________________   FINAL CLINICAL IMPRESSION(S) / ED DIAGNOSES  Final diagnoses:  Minor head injury, initial encounter  Syncope, unspecified syncope type  Closed fracture of nasal bone, initial encounter      NEW MEDICATIONS STARTED DURING THIS VISIT:  New Prescriptions   No medications on file     Note:  This document was prepared using Dragon voice recognition software and may include unintentional dictation errors.   Dionne Bucy, MD 11/28/20 1704

## 2020-11-28 NOTE — Telephone Encounter (Signed)
Pt is in the ER now.

## 2020-11-28 NOTE — Discharge Instructions (Signed)
Follow-up with your primary care doctor within the next few weeks.  Return to the ER for new, worsening, or persistent severe headache, confusion, weakness, recurrent episodes of feeling lightheaded or passing out, or any other new or worsening symptoms that concern you.

## 2020-11-28 NOTE — Telephone Encounter (Signed)
At the video visit 1/3--he stated he was feeling better and his oxygen sats were good. If he is hypoxic and mentally off, this is serious and she should call back EMS and make sure he goes to the ER for evaluation

## 2020-11-28 NOTE — ED Triage Notes (Signed)
Pt comes from home with a fall this morning. States that he lost his balance. Covid pos on Friday. Hit bridge of nose. Is on eliquis.

## 2020-11-28 NOTE — Telephone Encounter (Signed)
Patients wife called in stating that she is upset cause it seems nothing is being done for her husband. Patient has been triaged several times for low o2 stats and covid positive. She states today that he is very lethargic and just staring out in space. Patient went to bathroom and fell and busted his head open and was bleeding every where. Patients wife states that he lost all color in his face and is very weak. Got him up and called 911  And they came out to assess him and they told him he needed to go to the hospital patient denied care. Told the wife because he denied care there was nothing they could do. Explained to patients wife that I was not clinical and that at this time the only thing we can do is have him triaged again in which I did. I told her that we have no availablity and that our resources are limited and that we would have a nurse call them back. EM

## 2020-11-28 NOTE — Telephone Encounter (Signed)
Left message on VM for wife to call back and ask for either [facility].

## 2020-11-28 NOTE — Telephone Encounter (Signed)
[place] [facility] Day - Client TELEPHONE ADVICE RECORD [facility] Patient Name: Russell Garrett Gender: Male DOB: 1951/10/22 Age: 70 Y 5 M 7 D Return Phone Number: [phone] (Primary) Address: City/State/Zip: [place] [address] Client [place] [facility] Day - Client Client Site  [facility] [place] - Day Physician Tillman Abide- MD Contact Type Call Who Is Calling Patient / Member / Family / Caregiver Call Type Triage / Clinical Caller Name Lindley Hiney Relationship To Patient Spouse Return Phone Number [phone] (Primary) Chief Complaint Cuts and Lacerations Reason for Call Symptomatic / Request for Health Information Initial Comment Transferred from answering service, PT is positive for Covid, has been triaged several times by office, PT is now not acting right, fell and hit his head and cut it open and now very weak. Paramedics came out and he declined care. Caller disconnected before getting transferred. GOTO Facility Not Listed [facility] ER Translation No Nurse Assessment Nurse: Mayford Knife, RN, Rinaldo Cloud Date/Time Lamount Cohen Time): 11/28/2020 10:49:35 AM Confirm and document reason for call. If symptomatic, describe symptoms. ---per wife, husband fell today and paramedics came out and he declined to go to ER. cut on nose and nose bleed. (takes blood thinners). semi unconscious and sweating. Covid positive on Sunday the 1.3.21 Vaccines for COVID and Booster. exposed to COVID on Dec 27th. No fever now. Does the patient have any new or worsening symptoms? ---Yes Will a triage be completed? ---Yes Related visit to physician within the last 2 weeks? ---No Does the PT have any chronic conditions? (i.e. diabetes, asthma, this includes High risk factors for pregnancy, etc.) ---Yes List chronic conditions. ---HTN, PE lung 2 yrs ago Is this a behavioral health or substance abuse call? ---No Guidelines Guideline Title Affirmed Question  Affirmed Notes Nurse Date/Time (Eastern Time) Head Injury Taking Coumadin (warfarin) or other strong blood thinner, or known bleeding disorder (e.g., thrombocytopenia) Mayford Knife, RN, Rinaldo Cloud 11/28/2020 10:54:24 AM PLEASE NOTE: All timestamps contained within this report are represented as [place] Standard Time. CONFIDENTIALTY NOTICE: This fax transmission is intended only for the addressee. It contains information that is legally privileged, confidential or otherwise protected from use or disclosure. If you are not the intended recipient, you are strictly prohibited from reviewing, disclosing, copying using or disseminating any of this information or taking any action in reliance on or regarding this information. If you have received this fax in error, please notify us immediately by telephone so that we can arrange for its return to [place]. Phone: [phone], Toll-Free: [phone], Fax: [phone] Page: 2 of 2 Call Id: [id number] Disp. Time Lamount Cohen Time) Disposition Final User 11/28/2020 11:05:53 AM 911 Outcome Documentation Mayford Knife, RN, Rinaldo Cloud Reason: calling EMS wife decision 11/28/2020 11:05:09 AM Call EMS 911 Now Yes Mayford Knife, RN, Rinaldo Cloud Disposition Overriden: Go to ED Now (or PCP triage) Override Reason: Specify reason. (Please document in 'advice recommended' section) Caller Disagree/Comply Comply Caller Understands Yes PreDisposition Call Doctor Care Advice Given Per Guideline GO TO ED NOW (OR PCP TRIAGE): BLEEDING: * If there is a scrape or cut, wash it off with soap and water. * Then apply pressure with a sterile gauze for 10 minutes to stop any bleeding. Comments User: Octavio Manns, RN Date/Time Lamount Cohen Time): 11/28/2020 11:04:32 AM upgraded to Call EMS Now per wife decision. She does not want to have wait time in ER Lobby. Referrals GO TO FACILITY OTHER - SPECIFY

## 2020-11-28 NOTE — ED Notes (Signed)
First Rn note:  Pt comes into the ED via [facility] from home c/o fall this morning.  Pt currently COVID positive and has a nose bleed and is on Eliquis.  Pt fell around 7 am and initially refused EMS.

## 2020-11-29 NOTE — Telephone Encounter (Signed)
Please set him up for a follow up appt later next week ---from the COVID and the ER visit for fall

## 2020-11-30 NOTE — Telephone Encounter (Addendum)
Spoke with pt's wife, Juliann Pulse. States that pt has been having a horrible time with coughing. He received his COVID booster on 11/22/20 and on 11/23/20 started having symptoms. She tested positive for COVID on 11/20/20 so they decided to test him at home and found out he was positive on 11/25/20. Reports having issues with chest congestion as well, he is unable to cough any of the congestion up. Denies chest tightness, wheezing, shortness of breath or fever at this time. They have a pulse ox at home and she has been checking his O2. The lowest it's been was 87% on room air, after several deep breaths it increases to 97% on room air. Juliann Pulse was very concerned that she could not get an appointment for him before 12/05/20. She feels like he needs to be evaluated before then or at least given some recommendations on what to do for him until he can be seen on 12/05/20.

## 2020-11-30 NOTE — Telephone Encounter (Signed)
Called patient to schedule ER follow up. LVM to call back.

## 2020-11-30 NOTE — Telephone Encounter (Signed)
Patient's wife called in from message. Patient apparently had rough night last night and spent all night coughing. Wife not happy we are not doing much for patient and would like advice on what to do in regards to cough and other symptoms. Scheduled patient a virtual visit for 1/12, although wife not happy and stated patient may not be alive by then. Please advise patient and family on what able to do for symptoms.

## 2020-11-30 NOTE — Telephone Encounter (Signed)
Contacted pt to check on status. Pt reports chest congestion with cough but unable to cough anything up. Pt reports feeling a little better, no longer sore throat, no SOB, no fever.. O2 sat is currently 98%, and no fever. Pt reports he thinks he is doing better overall but his wife is concerned. Advised he could have VV with Rollene Fare B tomorrow. Inquired if wife wanted to talk. HE said yes. Wife advised pt still has a lot of chest congestion and coughs all night long. She did not sleep at all last night. She said his O2 sat dropped to the 80s last night while he was sleeping so she woke him up and sat him up and it went up to 95% immediatly. She is concerned about the cough and congestion. She is worried he has not been offered any treatment like the antibody infusion. Educated her about the infusion and how the supply is now limited and it has been over 7 days now so the pt does not qualify for it. She is interested in information about the oral medication. Advised this would be better discussed with a provider that had more knowledge about the medication and its availability and timing for treatment. Advised a VV could be set up for tomorrow to discuss. Pt's wife did want to join pt in the VV. Scheduled pt for VV with Regina B at 1240. Advised of ER precautions. Juliann Pulse verbalized understanding.

## 2020-11-30 NOTE — Telephone Encounter (Signed)
LMTCB x1 for pt's wife, Kathy.  

## 2020-12-01 ENCOUNTER — Encounter: Payer: Self-pay | Admitting: Internal Medicine

## 2020-12-01 ENCOUNTER — Telehealth (INDEPENDENT_AMBULATORY_CARE_PROVIDER_SITE_OTHER): Payer: PPO | Admitting: Internal Medicine

## 2020-12-01 VITALS — BP 126/77 | Temp 97.5°F | Ht 74.0 in | Wt 265.0 lb

## 2020-12-01 DIAGNOSIS — R059 Cough, unspecified: Secondary | ICD-10-CM

## 2020-12-01 DIAGNOSIS — U071 COVID-19: Secondary | ICD-10-CM

## 2020-12-01 MED ORDER — HYDROCOD POLST-CPM POLST ER 10-8 MG/5ML PO SUER: 5.0000 mL | Freq: Every evening | ORAL | 0 refills | Status: DC | PRN

## 2020-12-01 MED ORDER — PREDNISONE 10 MG PO TABS: ORAL_TABLET | ORAL | 0 refills | Status: DC

## 2020-12-01 NOTE — Telephone Encounter (Signed)
Will discuss at upcoming appt.

## 2020-12-02 ENCOUNTER — Encounter: Payer: Self-pay | Admitting: Internal Medicine

## 2020-12-02 NOTE — Progress Notes (Signed)
Virtual Visit via Video Note  I connected with Russell Garrett on 12/02/20 at 12:40 PM EST by a video enabled telemedicine application and verified that I am speaking with the correct person using two identifiers.  Location: Patient: Home Provider: Home  Persons participating in this video call: Russell Silversmith, NP and Russell Garrett   I discussed the limitations of evaluation and management by telemedicine and the availability of in person appointments. The patient expressed understanding and agreed to proceed.  History of Present Illness:  Patient reports cough.  This started about 6 days ago.  The cough is dry and nonproductive.  The cough is worse at night.  He did have a headache and sore throat that has since resolved.  He denies runny nose, nasal congestion, ear pain, loss of taste/smell or shortness of breath.  He denies fever, chills or body aches.  He has tried NyQuil OTC with minimal relief of symptoms.  He did test positive for COVID on 1/2.   He has had all 3 of his COVID vaccines.   Past Medical History:  Diagnosis Date  . Arthritis    r hip  . 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 Diverticulitis    pt denies  . Hyperlipidemia   . Hypertension   . 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 OSA (obstructive sleep apnea)   . Pulmonary air embolism ([place]) 02/2018   lifetime eliquis since then   . Sleep apnea    CPAP    Current Outpatient Medications  Medication Sig Dispense Refill  . Ascorbic Acid (VITAMIN C) 1000 MG tablet Take 1,000 mg by mouth daily.    Marland Kitchen atorvastatin (LIPITOR) 40 MG tablet TAKE ONE TABLET BY MOUTH EVERY DAY 90 tablet 3  . chlorpheniramine-HYDROcodone (TUSSIONEX PENNKINETIC ER) 10-8 MG/5ML SUER Take 5 mLs by mouth at bedtime as needed. 140 mL 0  . ELIQUIS 5 MG TABS tablet TAKE ONE TABLET BY MOUTH TWICE DAILY 60 tablet 11  . hydrochlorothiazide (HYDRODIURIL) 12.5 MG  tablet TAKE 1 TABLET BY MOUTH DAILY ALONG WITH THE LOSARTAN 50MG  (THE COMBINATION TABLET IS CURRENTLY UNAVAILABLE) 90 tablet 3  . losartan (COZAAR) 50 MG tablet TAKE 1 TABLET BY MOUTH DAILY ALONG WITH THE HCTZ 12.5MG  (THE COMBINATION TABLET IS CURRENTLY UNAVAILABLE) 90 tablet 3  . predniSONE (DELTASONE) 10 MG tablet Take 6 tabs day 1, 5 tabs day 2, 4 tabs day 3, 3 tabs day 4, 2 tabs day 5, 1 tab day 6 21 tablet 0  . VITAMIN D PO Take by mouth.     No current facility-administered medications for this visit.    No Known Allergies  Family History  Problem Relation Age of Onset  . Hypertension Mother   . Heart disease Mother   . Kidney disease Mother   . Heart disease Father   . Stroke Father   . Prostate cancer Father   . Kidney disease Father   . Heart disease Brother        cardioversion for arrhythmia  . Diabetes Neg Hx   . Colon cancer Neg Hx     Social History   Socioeconomic History  . Marital status: Married    Spouse name: Not on file  . Number of children: 2  . Years of education: Not on file  . Highest education level: Not on file  Occupational History  .  Occupation: retired- part Electronics engineer  Tobacco Use  . Smoking status: Former Smoker    Packs/day: 1.00    Years: 35.00    Pack years: 35.00    Types: Cigarettes    Quit date: 12/16/2015    Years since quitting: 4.9  . Smokeless tobacco: Never Used  Vaping Use  . Vaping Use: Never used  Substance and Sexual Activity  . Alcohol use: Yes    Alcohol/week: 0.0 standard drinks    Comment: occ  . Drug use: No  . Sexual activity: Not on file  Other Topics Concern  . Not on file  Social History Narrative   Widowed   Remarried June 2014      Has living will   Wife is health care POA--- alternate is daughter, Russell Garrett   Would accept resuscitation    Probably no tube feeds if cognitively unaware.   Social Determinants of Health   Financial Resource Strain: Not on file  Food Insecurity: Not on file   Transportation Needs: Not on file  Physical Activity: Not on file  Stress: Not on file  Social Connections: Not on file  Intimate Partner Violence: Not on file     Constitutional: Denies fever, malaise, fatigue, headache or abrupt weight changes.  HEENT: Denies eye pain, eye redness, ear pain, ringing in the ears, wax buildup, runny nose, nasal congestion, bloody nose, or sore throat. Respiratory: Patient reports cough.  Denies difficulty breathing, shortness of breath,  or sputum production.   Cardiovascular: Denies chest pain, chest tightness, palpitations or swelling in the hands or feet.  Gastrointestinal: Denies abdominal pain, bloating, constipation, diarrhea or blood in the stool.   No other specific complaints in a complete review of systems (except as listed in HPI above).  Observations/Objective:  BP 126/77   Temp (!) 97.5 F (36.4 C) (Temporal)   Ht 6\' 2"  (1.88 m)   Wt 265 lb (120.2 kg)   SpO2 95%   BMI 34.02 kg/m  Wt Readings from Last 3 Encounters:  12/01/20 265 lb (120.2 kg)  11/28/20 265 lb (120.2 kg)  08/16/20 268 lb (121.6 kg)    General: Appears his stated age, in NAD. HEENT: Nose: No congestion noted; Throat/Mouth: No hoarseness noted.  Pulmonary/Chest: Dry cough noted.  No respiratory distress.  Neurological: Alert and oriented.   BMET    Component Value Date/Time   NA 135 11/28/2020 1246   K 4.0 11/28/2020 1246   CL 98 11/28/2020 1246   CO2 26 11/28/2020 1246   GLUCOSE 137 (H) 11/28/2020 1246   BUN 26 (H) 11/28/2020 1246   CREATININE 1.27 (H) 11/28/2020 1246   CALCIUM 8.8 (L) 11/28/2020 1246   GFRNONAA >60 11/28/2020 1246   GFRAA >60 09/28/2018 0419    Lipid Panel     Component Value Date/Time   CHOL 151 04/24/2020 0912   TRIG 52.0 04/24/2020 0912   HDL 54.60 04/24/2020 0912   CHOLHDL 3 04/24/2020 0912   VLDL 10.4 04/24/2020 0912   LDLCALC 86 04/24/2020 0912    CBC    Component Value Date/Time   WBC 3.3 (L) 11/28/2020 1246    RBC 4.85 11/28/2020 1246   HGB 14.9 11/28/2020 1246   HCT 44.1 11/28/2020 1246   PLT 125 (L) 11/28/2020 1246   MCV 90.9 11/28/2020 1246   MCH 30.7 11/28/2020 1246   MCHC 33.8 11/28/2020 1246   RDW 12.9 11/28/2020 1246   LYMPHSABS 1.3 03/17/2017 1036   MONOABS 0.5 03/17/2017 1036  EOSABS 0.3 03/17/2017 1036   BASOSABS 0.1 03/17/2017 1036    Hgb A1C No results found for: HGBA1C     Assessment and Plan:  Cough secondary to COVID-19:  Rx for prednisone x6 days Rx for Tussionex cough syrup No indication for antibiotics at this time Continue Tylenol as needed Encourage rest and fluids Encourage masking, social distancing, frequent handwashing and self quarantine until greater than 24 hours fever free  Return/ER precautions discussed  Follow Up Instructions:    I discussed the assessment and treatment plan with the patient. The patient was provided an opportunity to ask questions and all were answered. The patient agreed with the plan and demonstrated an understanding of the instructions.   The patient was advised to call back or seek an in-person evaluation if the symptoms worsen or if the condition fails to improve as anticipated.    Russell Silversmith, NP

## 2020-12-02 NOTE — Patient Instructions (Signed)
COVID-19 COVID-19 is a respiratory infection that is caused by a virus called severe acute respiratory syndrome coronavirus 2 (SARS-CoV-2). The disease is also known as coronavirus disease or novel coronavirus. In some people, the virus may not cause any symptoms. In others, it may cause a serious infection. The infection can get worse quickly and can lead to complications, such as:  Pneumonia, or infection of the lungs.  Acute respiratory distress syndrome or ARDS. This is a condition in which fluid build-up in the lungs prevents the lungs from filling with air and passing oxygen into the blood.  Acute respiratory failure. This is a condition in which there is not enough oxygen passing from the lungs to the body or when carbon dioxide is not passing from the lungs out of the body.  Sepsis or septic shock. This is a serious bodily reaction to an infection.  Blood clotting problems.  Secondary infections due to bacteria or fungus.  Organ failure. This is when your body's organs stop working. The virus that causes COVID-19 is contagious. This means that it can spread from person to person through droplets from coughs and sneezes (respiratory secretions). What are the causes? This illness is caused by a virus. You may catch the virus by:  Breathing in droplets from an infected person. Droplets can be spread by a person breathing, speaking, singing, coughing, or sneezing.  Touching something, like a table or a doorknob, that was exposed to the virus (contaminated) and then touching your mouth, nose, or eyes. What increases the risk? Risk for infection You are more likely to be infected with this virus if you:  Are within 6 feet (2 meters) of a person with COVID-19.  Provide care for or live with a person who is infected with COVID-19.  Spend time in crowded indoor spaces or live in shared housing. Risk for serious illness You are more likely to become seriously ill from the virus if you:   Are 50 years of age or older. The higher your age, the more you are at risk for serious illness.  Live in a nursing home or long-term care facility.  Have cancer.  Have a long-term (chronic) disease such as: ? Chronic lung disease, including chronic obstructive pulmonary disease or asthma. ? A long-term disease that lowers your body's ability to fight infection (immunocompromised). ? Heart disease, including heart failure, a condition in which the arteries that lead to the heart become narrow or blocked (coronary artery disease), a disease which makes the heart muscle thick, weak, or stiff (cardiomyopathy). ? Diabetes. ? Chronic kidney disease. ? Sickle cell disease, a condition in which red blood cells have an abnormal "sickle" shape. ? Liver disease.  Are obese. What are the signs or symptoms? Symptoms of this condition can range from mild to severe. Symptoms may appear any time from 2 to 14 days after being exposed to the virus. They include:  A fever or chills.  A cough.  Difficulty breathing.  Headaches, body aches, or muscle aches.  Runny or stuffy (congested) nose.  A sore throat.  New loss of taste or smell. Some people may also have stomach problems, such as nausea, vomiting, or diarrhea. Other people may not have any symptoms of COVID-19. How is this diagnosed? This condition may be diagnosed based on:  Your signs and symptoms, especially if: ? You live in an area with a COVID-19 outbreak. ? You recently traveled to or from an area where the virus is common. ? You   provide care for or live with a person who was diagnosed with COVID-19. ? You were exposed to a person who was diagnosed with COVID-19.  A physical exam.  Lab tests, which may include: ? Taking a sample of fluid from the back of your nose and throat (nasopharyngeal fluid), your nose, or your throat using a swab. ? A sample of mucus from your lungs (sputum). ? Blood tests.  Imaging tests, which  may include, X-rays, CT scan, or ultrasound. How is this treated? At present, there is no medicine to treat COVID-19. Medicines that treat other diseases are being used on a trial basis to see if they are effective against COVID-19. Your health care provider will talk with you about ways to treat your symptoms. For most people, the infection is mild and can be managed at home with rest, fluids, and over-the-counter medicines. Treatment for a serious infection usually takes places in a hospital intensive care unit (ICU). It may include one or more of the following treatments. These treatments are given until your symptoms improve.  Receiving fluids and medicines through an IV.  Supplemental oxygen. Extra oxygen is given through a tube in the nose, a face mask, or a hood.  Positioning you to lie on your stomach (prone position). This makes it easier for oxygen to get into the lungs.  Continuous positive airway pressure (CPAP) or bi-level positive airway pressure (BPAP) machine. This treatment uses mild air pressure to keep the airways open. A tube that is connected to a motor delivers oxygen to the body.  Ventilator. This treatment moves air into and out of the lungs by using a tube that is placed in your windpipe.  Tracheostomy. This is a procedure to create a hole in the neck so that a breathing tube can be inserted.  Extracorporeal membrane oxygenation (ECMO). This procedure gives the lungs a chance to recover by taking over the functions of the heart and lungs. It supplies oxygen to the body and removes carbon dioxide. Follow these instructions at home: Lifestyle  If you are sick, stay home except to get medical care. Your health care provider will tell you how long to stay home. Call your health care provider before you go for medical care.  Rest at home as told by your health care provider.  Do not use any products that contain nicotine or tobacco, such as cigarettes, e-cigarettes, and  chewing tobacco. If you need help quitting, ask your health care provider.  Return to your normal activities as told by your health care provider. Ask your health care provider what activities are safe for you. General instructions  Take over-the-counter and prescription medicines only as told by your health care provider.  Drink enough fluid to keep your urine pale yellow.  Keep all follow-up visits as told by your health care provider. This is important. How is this prevented?  There is no vaccine to help prevent COVID-19 infection. However, there are steps you can take to protect yourself and others from this virus. To protect yourself:   Do not travel to areas where COVID-19 is a risk. The areas where COVID-19 is reported change often. To identify high-risk areas and travel restrictions, check the CDC travel website: wwwnc.cdc.gov/travel/notices  If you live in, or must travel to, an area where COVID-19 is a risk, take precautions to avoid infection. ? Stay away from people who are sick. ? Wash your hands often with soap and water for 20 seconds. If soap and water   are not available, use an alcohol-based hand sanitizer. ? Avoid touching your mouth, face, eyes, or nose. ? Avoid going out in public, follow guidance from your state and local health authorities. ? If you must go out in public, wear a cloth face covering or face mask. Make sure your mask covers your nose and mouth. ? Avoid crowded indoor spaces. Stay at least 6 feet (2 meters) away from others. ? Disinfect objects and surfaces that are frequently touched every day. This may include:  Counters and tables.  Doorknobs and light switches.  Sinks and faucets.  Electronics, such as phones, remote controls, keyboards, computers, and tablets. To protect others: If you have symptoms of COVID-19, take steps to prevent the virus from spreading to others.  If you think you have a COVID-19 infection, contact your health care  provider right away. Tell your health care team that you think you may have a COVID-19 infection.  Stay home. Leave your house only to seek medical care. Do not use public transport.  Do not travel while you are sick.  Wash your hands often with soap and water for 20 seconds. If soap and water are not available, use alcohol-based hand sanitizer.  Stay away from other members of your household. Let healthy household members care for children and pets, if possible. If you have to care for children or pets, wash your hands often and wear a mask. If possible, stay in your own room, separate from others. Use a different bathroom.  Make sure that all people in your household wash their hands well and often.  Cough or sneeze into a tissue or your sleeve or elbow. Do not cough or sneeze into your hand or into the air.  Wear a cloth face covering or face mask. Make sure your mask covers your nose and mouth. Where to find more information  Centers for Disease Control and Prevention: www.cdc.gov/coronavirus/2019-ncov/index.html  World Health Organization: www.who.int/health-topics/coronavirus Contact a health care provider if:  You live in or have traveled to an area where COVID-19 is a risk and you have symptoms of the infection.  You have had contact with someone who has COVID-19 and you have symptoms of the infection. Get help right away if:  You have trouble breathing.  You have pain or pressure in your chest.  You have confusion.  You have bluish lips and fingernails.  You have difficulty waking from sleep.  You have symptoms that get worse. These symptoms may represent a serious problem that is an emergency. Do not wait to see if the symptoms will go away. Get medical help right away. Call your local emergency services (911 in the [place] Do not drive yourself to the hospital. Let the emergency medical personnel know if you think you have COVID-19. Summary  COVID-19 is a  respiratory infection that is caused by a virus. It is also known as coronavirus disease or novel coronavirus. It can cause serious infections, such as pneumonia, acute respiratory distress syndrome, acute respiratory failure, or sepsis.  The virus that causes COVID-19 is contagious. This means that it can spread from person to person through droplets from breathing, speaking, singing, coughing, or sneezing.  You are more likely to develop a serious illness if you are 50 years of age or older, have a weak immune system, live in a nursing home, or have chronic disease.  There is no medicine to treat COVID-19. Your health care provider will talk with you about ways to treat your symptoms.    Take steps to protect yourself and others from infection. Wash your hands often and disinfect objects and surfaces that are frequently touched every day. Stay away from people who are sick and wear a mask if you are sick. This information is not intended to replace advice given to you by your health care provider. Make sure you discuss any questions you have with your health care provider. Document Revised: 09/09/2019 Document Reviewed: 12/16/2018 Elsevier Patient Education  2020 [employer]  

## 2020-12-05 ENCOUNTER — Encounter: Payer: Self-pay | Admitting: Internal Medicine

## 2020-12-05 ENCOUNTER — Telehealth: Payer: PPO | Admitting: Internal Medicine

## 2020-12-05 ENCOUNTER — Other Ambulatory Visit: Payer: Self-pay

## 2020-12-05 DIAGNOSIS — R55 Syncope and collapse: Secondary | ICD-10-CM | POA: Insufficient documentation

## 2020-12-05 NOTE — Progress Notes (Signed)
Visit canceled by patient--he feels fine now

## 2020-12-05 NOTE — Assessment & Plan Note (Signed)
Full evaluation in the ER He apparently feels better and decided to abort the visit after waiting 15-20 minutes for me

## 2020-12-07 ENCOUNTER — Telehealth: Payer: Self-pay

## 2020-12-07 NOTE — Telephone Encounter (Signed)
Called and spoke with patient who states he is feeling better he just has some fatigue. Encouraged rest, fluids and a well balanced meal. Patient verbalized understanding.

## 2020-12-07 NOTE — Telephone Encounter (Signed)
[facility] - Client TELEPHONE ADVICE RECORD [facility] Patient Name: Russell Garrett Gender: Male DOB: [birth year] Age: 70 Y 29 M 16 D Return Phone Number: [phone] (Primary), [phone] (Secondary) Address: City/State/Zip: [place] [address] Client [facility] - Client Client Site [facility] - Day Physician Viviana Simpler- MD Contact Type Call Who Is Calling Patient / Member / Family / Caregiver Call Type Triage / Clinical Caller Name Braylon Grenda Relationship To Patient Spouse Return Phone Number [phone] (Primary) Chief Complaint Fatigue (>THREE MONTHS) Reason for Call Symptomatic / Request for [place] states husband tested pos for Jan 2; fatigue his only sx; still pos a couple of days ago; Translation No Nurse Assessment Nurse: Raenette Rover, RN, Zella Ball Date/Time (Eastern Time): 12/07/2020 12:36:55 PM Confirm and document reason for call. If symptomatic, describe symptoms. ---Caller states husband tested pos for covid Jan 2; He had actually been covid exposed dec 28th and He got the booster the 30th. He thought he was having a reaction to the booster the next day. On 11/26/19 he was positive. 0/3/70 complications of fainting went to the hospital. 1/8 He was given steroids and cough syrup. Finished prednisone today and now without symptoms except very tired. Does the patient have any new or worsening symptoms? ---Yes Will a triage be completed? ---Yes Related visit to physician within the last 2 weeks? ---No Does the PT have any chronic conditions? (i.e. diabetes, asthma, this includes High risk factors for pregnancy, etc.) ---No Is this a behavioral health or substance abuse call? ---No Guidelines Guideline Title Affirmed Question Affirmed Notes Nurse Date/Time Eilene Ghazi Time) COVID-19 - Persisting Symptoms Follow-up Call [1] PERSISTING SYMPTOMS OF COVID-19 AND  [2] symptoms Karie Kirks, RN, Zella Ball 12/07/2020 12:42:39 PM Disp. Time Eilene Ghazi Time) Disposition Final User 12/07/2020 12:48:45 PM See PCP within 24 Hours Yes Raenette Rover, RN, Zella Ball PLEASE NOTE: All timestamps contained within this report are represented as [place] Standard Time. CONFIDENTIALTY NOTICE: This fax transmission is intended only for the addressee. It contains information that is legally privileged, confidential or otherwise protected from use or disclosure. If you are not the intended recipient, you are strictly prohibited from reviewing, disclosing, copying using or disseminating any of this information or taking any action in reliance on or regarding this information. If you have received this fax in error, please notify us immediately by telephone so that we can arrange for its return to [place]. Phone: [phone], Toll-Free: [phone], Fax: [phone] Page: 2 of 2 Call Id: [id number] [place] Disagree/Comply Comply Caller Understands Yes PreDisposition Did not know what to do Care Advice Given Per Guideline SEE PCP WITHIN 24 HOURS: CALL BACK IF: * You become worse CARE ADVICE given per COVID-19 - Persisting Symptoms Follow-Up Call (Adult) guideline. Comments User: Wilson Singer, RN Date/Time Eilene Ghazi Time): 12/07/2020 12:42:52 PM takes eliquis for sleep apnea. Referrals REFERRED TO PCP OFFICE

## 2020-12-08 ENCOUNTER — Telehealth: Payer: PPO | Admitting: Family Medicine

## 2020-12-08 ENCOUNTER — Other Ambulatory Visit: Payer: Self-pay

## 2020-12-09 NOTE — Telephone Encounter (Signed)
okay

## 2021-04-02 ENCOUNTER — Other Ambulatory Visit: Payer: Self-pay | Admitting: Internal Medicine

## 2021-04-15 ENCOUNTER — Other Ambulatory Visit: Payer: Self-pay | Admitting: Cardiothoracic Surgery

## 2021-04-15 DIAGNOSIS — I712 Thoracic aortic aneurysm, without rupture, unspecified: Secondary | ICD-10-CM

## 2021-04-26 ENCOUNTER — Encounter: Payer: Self-pay | Admitting: Internal Medicine

## 2021-04-26 ENCOUNTER — Ambulatory Visit (INDEPENDENT_AMBULATORY_CARE_PROVIDER_SITE_OTHER): Payer: PPO | Admitting: Internal Medicine

## 2021-04-26 ENCOUNTER — Other Ambulatory Visit: Payer: Self-pay

## 2021-04-26 VITALS — BP 140/86 | HR 69 | Temp 97.9°F | Ht 72.75 in | Wt 286.0 lb

## 2021-04-26 DIAGNOSIS — Z7189 Other specified counseling: Secondary | ICD-10-CM

## 2021-04-26 DIAGNOSIS — I712 Thoracic aortic aneurysm, without rupture, unspecified: Secondary | ICD-10-CM

## 2021-04-26 DIAGNOSIS — I700 Atherosclerosis of aorta: Secondary | ICD-10-CM | POA: Diagnosis not present

## 2021-04-26 DIAGNOSIS — I10 Essential (primary) hypertension: Secondary | ICD-10-CM

## 2021-04-26 DIAGNOSIS — Z86711 Personal history of pulmonary embolism: Secondary | ICD-10-CM

## 2021-04-26 DIAGNOSIS — Z0000 Encounter for general adult medical examination without abnormal findings: Secondary | ICD-10-CM

## 2021-04-26 LAB — CBC
HCT: 43.6 % (ref 39.0–52.0)
Hemoglobin: 14.8 g/dL (ref 13.0–17.0)
MCHC: 34 g/dL (ref 30.0–36.0)
MCV: 89.5 fl (ref 78.0–100.0)
Platelets: 159 10*3/uL (ref 150.0–400.0)
RBC: 4.87 Mil/uL (ref 4.22–5.81)
RDW: 13.5 % (ref 11.5–15.5)
WBC: 4.1 10*3/uL (ref 4.0–10.5)

## 2021-04-26 MED ORDER — HYDROCHLOROTHIAZIDE 12.5 MG PO TABS: 12.5000 mg | ORAL_TABLET | Freq: Every day | ORAL | 3 refills | Status: DC

## 2021-04-26 MED ORDER — LOSARTAN POTASSIUM 50 MG PO TABS: 50.0000 mg | ORAL_TABLET | Freq: Every day | ORAL | 3 refills | Status: DC

## 2021-04-26 MED ORDER — ATORVASTATIN CALCIUM 40 MG PO TABS: 1.0000 | ORAL_TABLET | Freq: Every day | ORAL | 3 refills | Status: DC

## 2021-04-26 NOTE — Progress Notes (Signed)
Hearing Screening   125Hz  250Hz  500Hz  1000Hz  2000Hz  3000Hz  4000Hz  6000Hz  8000Hz   Right ear:   20 20 20  20     Left ear:   20 20 25  20       Visual Acuity Screening   Right eye Left eye Both eyes  Without correction:     With correction: 20/20 20/20 20/15

## 2021-04-26 NOTE — Assessment & Plan Note (Signed)
Is on statin. 

## 2021-04-26 NOTE — Progress Notes (Signed)
Subjective:    Patient ID: Russell Garrett, male    DOB: March 11, 1951, 70 y.o.   MRN: [id number]  HPI Here for Medicare wellness visit and follow up of chronic health conditions This visit occurred during the SARS-CoV-2 public health emergency.  Safety protocols were in place, including screening questions prior to the visit, additional usage of staff PPE, and extensive cleaning of exam room while observing appropriate contact time as indicated for disinfecting solutions.   Reviewed form and advanced directives Reviewed other doctors Occasional beer No tobacco Not walking as much since [place] is okay. No problems with hearing Had 1 fall--during COVID. Broke nose Not depressed. Some degree of anhedonia Independent with instrumental ADLs--still does yard work Some memory issues  Feels his memory is bad---names DOE is slowing improving  Did finally go out and play golf again (first time)  Does have follow up with Dr Orvan Seen for the [facility] Known aortic atherosclerosis  No chest pain DOE---but this is improving since COVID No palpitations No dizziness or syncope Some edema---some increase with weight gain  Current Outpatient Medications on File Prior to Visit  Medication Sig Dispense Refill  . Ascorbic Acid (VITAMIN C) 1000 MG tablet Take 1,000 mg by mouth daily.    Marland Kitchen atorvastatin (LIPITOR) 40 MG tablet TAKE ONE TABLET BY MOUTH EVERY DAY 90 tablet 3  . ELIQUIS 5 MG TABS tablet TAKE ONE TABLET BY MOUTH TWICE DAILY 180 tablet 3  . hydrochlorothiazide (HYDRODIURIL) 12.5 MG tablet TAKE 1 TABLET BY MOUTH DAILY ALONG WITH THE LOSARTAN 50MG  (THE COMBINATION TABLET IS CURRENTLY UNAVAILABLE) 90 tablet 3  . losartan (COZAAR) 50 MG tablet TAKE 1 TABLET BY MOUTH DAILY ALONG WITH THE HCTZ 12.5MG  (THE COMBINATION TABLET IS CURRENTLY UNAVAILABLE) 90 tablet 3  . VITAMIN D PO Take by mouth.    . [DISCONTINUED] lisinopril-hydrochlorothiazide (PRINZIDE,ZESTORETIC) 10-12.5 MG per tablet Take 1 tablet  by mouth daily. 30 tablet 11   No current facility-administered medications on file prior to visit.    No Known Allergies  Past Medical History:  Diagnosis Date  . Arthritis    r hip  . 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 Diverticulitis    pt denies  . Hyperlipidemia   . Hypertension   . 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 OSA (obstructive sleep apnea)   . Pulmonary air embolism ([place]) 02/2018   lifetime eliquis since then   . Sleep apnea    CPAP    Past Surgical History:  Procedure Laterality Date  . BACK SURGERY    . COLONOSCOPY    . THORACENTESIS  08/2006   left pleural effusion  . 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 Hypertension Mother   . Heart disease Mother   . Kidney disease Mother   . Heart disease Father   . Stroke Father   . Prostate cancer Father   . Kidney disease Father   . Heart disease Brother        cardioversion for arrhythmia  . Diabetes Neg Hx   . Colon cancer Neg Hx     Social History   Socioeconomic History  .  Marital status: Married    Spouse name: Not on file  . Number of children: 2  . Years of education: Not on file  . Highest education level: Not on file  Occupational History  . Occupation: retired- part Electronics engineer  Tobacco Use  . Smoking status: Former Smoker    Packs/day: 1.00    Years: 35.00    Pack years: 35.00    Types: Cigarettes    Quit date: 12/16/2015    Years since quitting: 5.3  . Smokeless tobacco: Never Used  Vaping Use  . Vaping Use: Never used  Substance and Sexual Activity  . Alcohol use: Yes    Alcohol/week: 0.0 standard drinks    Comment: occ  . Drug use: No  . Sexual activity:  Not on file  Other Topics Concern  . Not on file  Social History Narrative   Widowed   Remarried June 2014      Has living will   Wife is health care POA--- alternate is daughter, Magda Paganini   Would accept resuscitation    Probably no tube feeds if cognitively unaware.   Social Determinants of Health   Financial Resource Strain: Not on file  Food Insecurity: Not on file  Transportation Needs: Not on file  Physical Activity: Not on file  Stress: Not on file  Social Connections: Not on file  Intimate Partner Violence: Not on file   Review of Systems Looking to set up with dermatologist --discussed options. No suspicious lesions now Gained 20#---has not had much energy since COVID Sleeps okay---CPAP nightly with success Teeth okay--keeps up with dentist No heartburn or dysphagia Wears seat belt Bowels are okay---no blood Typical joint aches/back pain--sees chiropractor monthly    Objective:   Physical Exam Constitutional:      Appearance: Normal appearance.  HENT:     Mouth/Throat:     Pharynx: No oropharyngeal exudate or posterior oropharyngeal erythema.  Eyes:     Conjunctiva/sclera: Conjunctivae normal.     Pupils: Pupils are equal, round, and reactive to light.  Cardiovascular:     Rate and Rhythm: Normal rate and regular rhythm.     Heart sounds: No murmur heard. No gallop.      Comments: Faint pedal pulses Pulmonary:     Effort: Pulmonary effort is normal.     Breath sounds: Normal breath sounds. No wheezing or rales.  Abdominal:     Palpations: Abdomen is soft.     Tenderness: There is no abdominal tenderness.  Musculoskeletal:     Cervical back: Neck supple.     Comments: Trace to 1+ ankle edema  Lymphadenopathy:     Cervical: No cervical adenopathy.  Skin:    General: Skin is warm.     Findings: No rash.  Neurological:     Mental Status: He is alert and oriented to person, place, and time.     Comments: President--- "Zoila Shutter, not  Clinton--?" [id number] Recall 3/3  Psychiatric:        Mood and Affect: Mood normal.        Behavior: Behavior normal.            Assessment & Plan:

## 2021-04-26 NOTE — Assessment & Plan Note (Signed)
Due for follow up soon

## 2021-04-26 NOTE — Assessment & Plan Note (Signed)
Unprovoked so will continue eliquis indefinitely

## 2021-04-26 NOTE — Assessment & Plan Note (Signed)
See social history 

## 2021-04-26 NOTE — Assessment & Plan Note (Signed)
BP Readings from Last 3 Encounters:  04/26/21 140/86  12/01/20 126/77  11/28/20 113/71   Okay on losartan and HCTZ Will check labs

## 2021-04-26 NOTE — Patient Instructions (Signed)

## 2021-04-26 NOTE — Assessment & Plan Note (Signed)
I have personally reviewed the Medicare Annual Wellness questionnaire and have noted 1. The patient's medical and social history 2. Their use of alcohol, tobacco or illicit drugs 3. Their current medications and supplements 4. The patient's functional ability including ADL's, fall risks, home safety risks and hearing or visual             impairment. 5. Diet and physical activities 6. Evidence for depression or mood disorders  The patients weight, height, BMI and visual acuity have been recorded in the chart I have made referrals, counseling and provided education to the patient based review of the above and I have provided the pt with a written personalized care plan for preventive services.  I have provided you with a copy of your personalized plan for preventive services. Please take the time to review along with your updated medication list.  Shingrix at [place] booster soon Flu vaccine Colon in 2028 Defer PSA to next year Needs to improve lifestyle

## 2021-04-29 LAB — COMPREHENSIVE METABOLIC PANEL
ALT: 24 U/L (ref 0–53)
AST: 25 U/L (ref 0–37)
Albumin: 4.2 g/dL (ref 3.5–5.2)
Alkaline Phosphatase: 65 U/L (ref 39–117)
BUN: 23 mg/dL (ref 6–23)
CO2: 26 mEq/L (ref 19–32)
Calcium: 9.2 mg/dL (ref 8.4–10.5)
Chloride: 102 mEq/L (ref 96–112)
Creatinine, Ser: 1.15 mg/dL (ref 0.40–1.50)
GFR: 64.78 mL/min (ref >60.00)
Glucose, Bld: 90 mg/dL (ref 70–99)
Potassium: 4 mEq/L (ref 3.5–5.1)
Sodium: 137 mEq/L (ref 135–145)
Total Bilirubin: 0.7 mg/dL (ref 0.2–1.2)
Total Protein: 7.8 g/dL (ref 6.0–8.3)

## 2021-04-29 LAB — LIPID PANEL
Cholesterol: 176 mg/dL (ref 0–200)
HDL: 57.4 mg/dL (ref >39.00)
LDL Cholesterol: 103 mg/dL — ABNORMAL HIGH (ref 0–99)
NonHDL: 118.46
Total CHOL/HDL Ratio: 3
Triglycerides: 75 mg/dL (ref 0.0–149.0)
VLDL: 15 mg/dL (ref 0.0–40.0)

## 2021-05-16 ENCOUNTER — Ambulatory Visit
Admission: RE | Admit: 2021-05-16 | Discharge: 2021-05-16 | Disposition: A | Discharge status: Home / Self Care | Payer: PPO | Source: Ambulatory Visit | Attending: Cardiothoracic Surgery | Admitting: Cardiothoracic Surgery

## 2021-05-16 ENCOUNTER — Other Ambulatory Visit: Payer: Self-pay

## 2021-05-16 ENCOUNTER — Encounter: Payer: Self-pay | Admitting: Cardiothoracic Surgery

## 2021-05-16 ENCOUNTER — Ambulatory Visit: Payer: PPO | Admitting: Cardiothoracic Surgery

## 2021-05-16 VITALS — BP 130/70 | HR 70 | Resp 20 | Ht 72.75 in | Wt 286.0 lb

## 2021-05-16 DIAGNOSIS — I2510 Atherosclerotic heart disease of native coronary artery without angina pectoris: Secondary | ICD-10-CM | POA: Diagnosis not present

## 2021-05-16 DIAGNOSIS — I712 Thoracic aortic aneurysm, without rupture, unspecified: Secondary | ICD-10-CM

## 2021-05-16 DIAGNOSIS — J9811 Atelectasis: Secondary | ICD-10-CM | POA: Diagnosis not present

## 2021-05-16 DIAGNOSIS — I700 Atherosclerosis of aorta: Secondary | ICD-10-CM | POA: Diagnosis not present

## 2021-05-16 NOTE — Progress Notes (Signed)
Chief complaint: Aneurysm surveillance  History of present illness:  70 year old male presents for annual surveillance of ascending aortic aneurysm.  Since he was last seen 1 year ago, he has been doing reasonably well.  He did have symptomatic COVID infection around the turn of the calendar year.  He has been slow to recover from this.  He remains on anticoagulation for a pulmonary embolus which preceded his COVID infection.  He is on 3 blood pressure medications and this is controlling his blood pressure well.  He does note some shortness of breath and dyspnea on exertion for example when he walks for an extended period or when he is working in the yard.  He denies angina .   Active Ambulatory Problems    Diagnosis Date Noted   Hyperlipemia 03/09/2007   OBSTRUCTIVE SLEEP APNEA 08/01/2008   Essential hypertension, benign 03/09/2007   DIVERTICULOSIS, COLON 03/09/2007   Routine general medical examination at a health care facility 02/16/2012   History of pulmonary embolism 03/04/2018   Thoracic aortic aneurysm without rupture ([place]) 03/04/2018   Aortic atherosclerosis ([place]) 03/04/2018   Obesity 03/19/2018   Advance directive discussed with patient 03/19/2018   Osteoarthritis of back 09/27/2018   Resolved Ambulatory Problems    Diagnosis Date Noted   Acute bronchitis 12/16/2011   Chest pain, atypical 09/20/2013   Smoker 09/21/2013   DOE (dyspnea on exertion) 07/24/2015   AKI (acute kidney injury) ([place]) 07/24/2015   Fatigue 04/25/2016   Welcome to Medicare preventive visit 03/17/2017   Right knee pain 03/17/2017   Right hip OA, Advanced 03/24/2017   Preoperative evaluation to rule out surgical contraindication 07/21/2018   Syncope 12/05/2020   Past Medical History:  Diagnosis Date   Arthritis    Birth defect    Diverticulitis    Hyperlipidemia    Hypertension    Mild ascending aorta dilatation (HCC)    OSA (obstructive sleep apnea)    Pulmonary air embolism ([place]) 02/2018    Sleep apnea     Current Outpatient Medications on File Prior to Visit  Medication Sig Dispense Refill   Ascorbic Acid (VITAMIN C) 1000 MG tablet Take 1,000 mg by mouth daily.     atorvastatin (LIPITOR) 40 MG tablet Take 1 tablet (40 mg total) by mouth daily. 90 tablet 3   ELIQUIS 5 MG TABS tablet TAKE ONE TABLET BY MOUTH TWICE DAILY 180 tablet 3   hydrochlorothiazide (HYDRODIURIL) 12.5 MG tablet Take 1 tablet (12.5 mg total) by mouth daily. 90 tablet 3   losartan (COZAAR) 50 MG tablet Take 1 tablet (50 mg total) by mouth daily. 90 tablet 3   VITAMIN D PO Take by mouth.     [DISCONTINUED] lisinopril-hydrochlorothiazide (PRINZIDE,ZESTORETIC) 10-12.5 MG per tablet Take 1 tablet by mouth daily. 30 tablet 11   No current facility-administered medications on file prior to visit.  Physical exam: BP 130/70 (BP Location: Left Arm, Patient Position: Sitting, Cuff Size: Large)   Pulse 70   Resp 20   Ht 6' 0.75" (1.848 m)   Wt 129.7 kg   SpO2 95% Comment: RA  BMI 37.99 kg/m  Well-appearing man no acute distress HEENT: Normocephalic atraumatic no carotid bruits Chest: Clear to auscultation bilaterally Heart: Regular rate and rhythm no murmur detected Extremities: Mild bilateral lower extremity edema  Imaging: I personally reviewed his available imaging studiesIncluding CT scan of the chest from today which demonstrates a stable 5 cm ascending aortic aneurysm; he also has recognizable coronary calcifications particularly in the LAD  distribution.  Impression/plan: 70 year old man with stable ascending aortic aneurysm.  This can be followed up in 1 year Suggest cardiology evaluation given his multiple risk factors for coronary disease and his shortness of breath which I am not completely convinced is related to [place].  The family has requested consultation with Dr. Jenkins Rouge. [name] Z. Orvan Seen, [place]

## 2021-07-15 ENCOUNTER — Telehealth: Payer: Self-pay | Admitting: *Deleted

## 2021-07-15 NOTE — Chronic Care Management (AMB) (Signed)
  Chronic Care Management   Note  07/15/2021 Name: JAVARIUS TSOSIE MRN: [id number] DOB: 05-20-1951  Lawanda Cousins is a 70 y.o. year old male who is a primary care patient of Venia Carbon, MD. I reached out to Lawanda Cousins by phone today in response to a referral sent by Mr. Tyjai Matuszak Stewart's PCP Venia Carbon, MD     Mr. Shugart was given information about Chronic Care Management services today including:  CCM service includes personalized support from designated clinical staff supervised by his physician, including individualized plan of care and coordination with other care providers 24/7 contact phone numbers for assistance for urgent and routine care needs. Service will only be billed when office clinical staff spend 20 minutes or more in a month to coordinate care. Only one practitioner may furnish and bill the service in a calendar month. The patient may stop CCM services at any time (effective at the end of the month) by phone call to the office staff. The patient will be responsible for cost sharing (co-pay) of up to 20% of the service fee (after annual deductible is met).  Patient agreed to services and verbal consent obtained.   Follow up plan: Telephone appointment with care management team member scheduled for: 08/09/2021  Julian Hy, [facility]  Direct Dial: [phone]

## 2021-07-15 NOTE — Chronic Care Management (AMB) (Signed)
  Chronic Care Management   Outreach Note  07/15/2021 Name: [name] MRN: [id number] DOB: 10/09/1951  Russell Garrett is a 70 y.o. year old male who is a primary care patient of Venia Carbon, MD. I reached out to Russell Garrett by phone today in response to a referral sent by Mr. Regionald Schuelke Sanabia's PCP Venia Carbon, MD     An unsuccessful telephone outreach was attempted today. The patient was referred to the case management team for assistance with care management and care coordination.   Follow Up Plan: A HIPAA compliant phone message was left for the patient providing contact information and requesting a return call.  If patient returns call to provider office, please advise to call Embedded Care Management Care Guide Jaythan Hinely at [place] [place] [facility]  Direct Dial: [phone]

## 2021-08-09 ENCOUNTER — Ambulatory Visit: Payer: PPO

## 2021-08-09 NOTE — Chronic Care Management (AMB) (Signed)
  Care Management   Outreach Note  08/09/2021 Name: Russell Garrett MRN: [id number] DOB: November 24, 1951  Referred by: Venia Carbon, MD Reason for referral : Care Coordination (Initial assessment)   Successful contact was made with the patient to discuss care management and care coordination services. Patient declines engagement at this time.   Follow Up Plan:  The patient has been provided with contact information for the care management team and has been advised to call with any health related questions or concerns.   Quinn Plowman RN,BSN,CCM RN Case Manager [place]  [phone]

## 2021-08-09 NOTE — Patient Instructions (Signed)
.  cc[facility] Visit Information  Thank you for allowing me to share the care management and care coordination services that are available to you as part of your health plan and services through your primary care provider and medical home. Please reach out to me at [phone] if the care management/care coordination team may be of assistance to you in the future.   Quinn Plowman RN,BSN,CCM RN Case Manager [place]  [phone]

## 2021-08-26 NOTE — Progress Notes (Signed)
CARDIOLOGY CONSULT NOTE       Patient ID: Russell Garrett MRN: [id number] DOB/AGE: [birth year] 70 y.o.  Admit date: (Not on file) Referring Physician: Orvan Seen  Primary Physician: Venia Carbon, MD Primary Cardiologist: New Reason for Consultation: CAD Risk/Aneurysm/Dyspnea   Active Problems:   * No active hospital problems. *   HPI:  70 y.o. referred by Dr Silvio Pate for aortic aneurysm and CAD risk. History of PE prior to COVID infection earlier this year.  OSA HLD and HTN.  He follows with Dr Orvan Seen CVTS for his anuerysm.  CTA done 05/16/21 ascending aorta 4.7 cm  Noted some calcifications in LAD  Lung parenchyma with only mild linear atelectasis /scarring right lung base Echo done 02/26/18 showed EF 55-60% no LVH and tri leaflet AV He had a normal ETT 08/01/15   Since COVID beginning of year he has had dyspnea persist Dr Orvan Seen concerned that SOB Not just from 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   Diverticulitis    pt denies   Hyperlipidemia    Hypertension    Mild ascending aorta dilatation (HCC)    4.8cm in diamter per CT chest, see imaging in epic dated 02-25-18   OSA (obstructive sleep apnea)    Pulmonary air embolism ([place]) 02/2018   lifetime eliquis since then    Sleep apnea    CPAP    Family History  Problem Relation Age of Onset   Hypertension Mother    Heart  disease Mother    Kidney disease Mother    Heart disease Father    Stroke Father    Prostate cancer Father    Kidney disease Father    Heart disease Brother        cardioversion for arrhythmia   Diabetes Neg Hx    Colon cancer Neg Hx     Social History   Socioeconomic History   Marital status: Married    Spouse name: Not on file   Number of children: 2   Years of education: Not on file   Highest education level: Not on file  Occupational History   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  Tobacco Use   Smoking status: Former    Packs/day: 1.00    Years: 35.00    Pack years: 35.00    Types: Cigarettes    Quit date: 12/16/2015    Years since quitting: 5.7   Smokeless tobacco: Never  Vaping Use   Vaping Use: Never used  Substance and Sexual Activity   Alcohol use: Yes    Alcohol/week: 0.0 standard drinks    Comment: occ   Drug use: No   Sexual activity: Not on file  Other Topics Concern   Not on file  Social History Narrative   Widowed  Remarried June 2014      Has living will   Wife is health care POA--- alternate is daughter, Magda Paganini   Would accept resuscitation    Probably no tube feeds if cognitively unaware.   Social Determinants of Health   Financial Resource Strain: Not on file  Food Insecurity: Not on file  Transportation Needs: Not on file  Physical Activity: Not on file  Stress: Not on file  Social Connections: Not on file  Intimate Partner Violence: Not on file    Past Surgical History:  Procedure Laterality Date   BACK SURGERY     COLONOSCOPY     THORACENTESIS  08/2006   left pleural effusion   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   Procedure: RIGHT TOTAL HIP ARTHROPLASTY ANTERIOR APPROACH;  Surgeon: Gaynelle Arabian, MD;  Location: [facility];  Service: Orthopedics;  Laterality: Right;  117min      Current Outpatient Medications:    Ascorbic Acid (VITAMIN C) 1000 MG tablet, Take 1,000 mg by mouth daily., Disp: , Rfl:    atorvastatin  (LIPITOR) 40 MG tablet, Take 1 tablet (40 mg total) by mouth daily., Disp: 90 tablet, Rfl: 3   ELIQUIS 5 MG TABS tablet, TAKE ONE TABLET BY MOUTH TWICE DAILY, Disp: 180 tablet, Rfl: 3   hydrochlorothiazide (HYDRODIURIL) 12.5 MG tablet, Take 1 tablet (12.5 mg total) by mouth daily., Disp: 90 tablet, Rfl: 3   losartan (COZAAR) 50 MG tablet, Take 1 tablet (50 mg total) by mouth daily., Disp: 90 tablet, Rfl: 3   VITAMIN D PO, Take by mouth., Disp: , Rfl:     Physical Exam: Blood pressure 110/60, pulse 78, height 6\' 2"  (1.88 m), weight (!) 136.4 kg, SpO2 98 %.    Affect appropriate Healthy:  appears stated age 70: normal Neck supple with no adenopathy JVP normal no bruits no thyromegaly Lungs clear with no wheezing and good diaphragmatic motion Heart:  S1/S2 no murmur, no rub, gallop or click PMI normal Abdomen: benighn, BS positve, no tenderness, no AAA no bruit.  No HSM or HJR Distal pulses intact with no bruits No edema Neuro non-focal Skin warm and dry No muscular weakness   Labs:   Lab Results  Component Value Date   WBC 4.1 04/26/2021   HGB 14.8 04/26/2021   HCT 43.6 04/26/2021   MCV 89.5 04/26/2021   PLT 159.0 04/26/2021   No results for input(s): NA, K, CL, CO2, BUN, CREATININE, CALCIUM, PROT, BILITOT, ALKPHOS, ALT, AST, GLUCOSE in the last 168 hours.  Invalid input(s): LABALBU Lab Results  Component Value Date   TROPONINI <0.03 02/25/2018    Lab Results  Component Value Date   CHOL 176 04/26/2021   CHOL 151 04/24/2020   CHOL 176 03/17/2017   Lab Results  Component Value Date   HDL 57.40 04/26/2021   HDL 54.60 04/24/2020   HDL 47.80 03/17/2017   Lab Results  Component Value Date   LDLCALC 103 (H) 04/26/2021   LDLCALC 86 04/24/2020   LDLCALC 115 (H) 03/17/2017   Lab Results  Component Value Date   TRIG 75.0 04/26/2021   TRIG 52.0 04/24/2020   TRIG 69.0 03/17/2017   Lab Results  Component Value Date   CHOLHDL 3 04/26/2021   CHOLHDL 3  04/24/2020   CHOLHDL 4 03/17/2017   Lab Results  Component Value Date   LDLDIRECT 161.3 02/03/2011   LDLDIRECT 149.5 02/12/2010   LDLDIRECT 180.9 01/26/2008      Radiology: No results found.  EKG:  SR rate 83 LAD 11/28/20    ASSESSMENT AND PLAN:   Dyspnea:  likely combination of previous smoking, pneumonia, scarring, COVID and history of PE. Update echo for RV/LV function , PFTls with DLCO CAD:  calcification of LAD on CT normal ETT 2016 given above check lexiscan myovue HLD:  on lipitor 40 mg  HTN:  Well controlled.  Continue current medications and low sodium Dash type diet.   Aortic aneurysm:  f/u CVTS Dr Orvan Seen CTA June 2023 use gated chest protocol   Lexiscan myovue TTE PFTls with DLCO  F/U in a year pending tests   Signed: Jenkins Rouge 08/28/2021, 2:04 PM

## 2021-08-28 ENCOUNTER — Other Ambulatory Visit: Payer: Self-pay

## 2021-08-28 ENCOUNTER — Ambulatory Visit: Payer: PPO | Admitting: Cardiovascular Disease

## 2021-08-28 ENCOUNTER — Ambulatory Visit: Payer: PPO | Admitting: Internal Medicine

## 2021-08-28 ENCOUNTER — Encounter: Payer: Self-pay | Admitting: Internal Medicine

## 2021-08-28 VITALS — BP 132/74 | HR 57 | Temp 97.3°F | Ht 74.0 in | Wt 222.7 lb

## 2021-08-28 VITALS — BP 110/60 | HR 78 | Ht 74.0 in | Wt 300.8 lb

## 2021-08-28 DIAGNOSIS — G4733 Obstructive sleep apnea (adult) (pediatric): Secondary | ICD-10-CM

## 2021-08-28 DIAGNOSIS — Z23 Encounter for immunization: Secondary | ICD-10-CM

## 2021-08-28 DIAGNOSIS — R0600 Dyspnea, unspecified: Secondary | ICD-10-CM

## 2021-08-28 DIAGNOSIS — I712 Thoracic aortic aneurysm, without rupture, unspecified: Secondary | ICD-10-CM

## 2021-08-28 DIAGNOSIS — I2510 Atherosclerotic heart disease of native coronary artery without angina pectoris: Secondary | ICD-10-CM

## 2021-08-28 NOTE — Patient Instructions (Addendum)
Continue therapy with auto CPAP machine for sleep apnea Flu shot to be given today

## 2021-08-28 NOTE — Progress Notes (Signed)
@Patient  ID: Russell Garrett, male    DOB: Oct 23, 1951, 70 y.o.   MRN: [id number]   TEST/EVENTS :  Split-night sleep study July 2019 showed severe sleep apnea with AHI 42.7/hour New CPAP machine 2019 .   Chief complaint Follow-up OSA   HPI: 70 year old male followed for severe obstructive sleep apnea on nocturnal CPAP   08/28/2021 Follow up : OSA  1 year follow-up for severe sleep apnea Maintained on nocturnal CPAP Excellent compliance report reviewed with patient AHI reduced to 0.2 Auto CPAP 10-20 Minimal leaks Nasal mask Has been on CPAP for 20 years, cant sleep without it .   No Known Allergies  Immunization History  Administered Date(s) Administered   Fluad Quad(high Dose 65+) 07/27/2019, 08/16/2020   H1N1 02/09/2009   Influenza Split 08/06/2011, 08/18/2012, 10/09/2015   Influenza Whole 08/16/2010   Influenza,inj,Quad PF,6+ Mos 09/20/2013, 10/04/2016, 09/25/2018   Influenza,inj,quad, With Preservative 11/23/2017   Influenza-Unspecified 09/24/2016   PFIZER(Purple Top)SARS-COV-2 Vaccination 01/07/2020, 01/31/2020, 11/22/2020   Pneumococcal Conjugate-13 03/17/2017   Pneumococcal Polysaccharide-23 03/19/2018   Pneumococcal-Unspecified 02/22/2013   Td 12/06/1996, 01/25/2008   Tdap 03/19/2018   Zoster Recombinat (Shingrix) 05/02/2021   Zoster, Live 03/11/2013    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   Diverticulitis    pt denies   Hyperlipidemia    Hypertension    Mild ascending aorta dilatation (HCC)    4.8cm in diamter per CT chest, see imaging in epic dated 02-25-18   OSA (obstructive sleep apnea)    Pulmonary air embolism ([place]) 02/2018   lifetime eliquis since then    Sleep apnea    CPAP    Tobacco History: Social History   Tobacco Use  Smoking Status Former   Packs/day: 1.00   Years: 35.00   Pack years: 35.00   Types: Cigarettes   Quit date: 12/16/2015   Years since  quitting: 5.7  Smokeless Tobacco Never   Counseling given: Not Answered   Outpatient Medications Prior to Visit  Medication Sig Dispense Refill   Ascorbic Acid (VITAMIN C) 1000 MG tablet Take 1,000 mg by mouth daily.     atorvastatin (LIPITOR) 40 MG tablet Take 1 tablet (40 mg total) by mouth daily. 90 tablet 3   ELIQUIS 5 MG TABS tablet TAKE ONE TABLET BY MOUTH TWICE DAILY 180 tablet 3   hydrochlorothiazide (HYDRODIURIL) 12.5 MG tablet Take 1 tablet (12.5 mg total) by mouth daily. 90 tablet 3   losartan (COZAAR) 50 MG tablet Take 1 tablet (50 mg total) by mouth daily. 90 tablet 3   VITAMIN D PO Take by mouth.     No facility-administered medications prior to visit.     Review of Systems:  Gen:  Denies  fever, sweats, chills weight loss  HEENT: Denies blurred vision, double vision, ear pain, eye pain, hearing loss, nose bleeds, sore throat Cardiac:  No dizziness, chest pain or heaviness, chest tightness,edema, No JVD Resp:   No cough, -sputum production, -shortness of breath,-wheezing, -hemoptysis,  Gi: Denies swallowing difficulty, stomach pain, nausea or vomiting, diarrhea, constipation, bowel incontinence Gu:  Denies bladder incontinence, burning urine Ext:   Denies Joint pain, stiffness or swelling Skin: Denies  skin rash, easy bruising or bleeding or hives Endoc:  Denies polyuria, polydipsia , polyphagia or weight change Psych:   Denies depression, insomnia or hallucinations  Other:  All other systems negative  Physical Exam  BP 132/74 (BP Location: Left Arm, Patient Position: Sitting, Cuff Size: Large)   Pulse (!) 57   Temp (!) 97.3 F (36.3 C)   Ht 6\' 2"  (1.88 m)   Wt 222 lb 10.6 oz (101 kg)   SpO2 99%   BMI 28.59 kg/m   Physical Examination:   General Appearance: No distress  EYES PERRLA, EOM intact.   NECK Supple, No JVD Pulmonary: normal breath sounds, No wheezing.  CardiovascularNormal S1,S2.  No m/r/g.   Abdomen: Benign, Soft, non-tender. Skin:    warm, no rashes, no ecchymosis  Extremities: normal, no cyanosis, clubbing. Neuro:without focal findings,  speech normal  PSYCHIATRIC: Mood, affect within normal limits.   ALL OTHER ROS ARE NEGATIVE     Assessment & Plan:   70 year old pleasant white male seen today for follow-up assessment for sleep apnea Patient is very compliant Excellent compliance report reviewed in detail with the patient Continue auto CPAP as prescribed  Obesity -recommend significant weight loss -recommend changing diet  Deconditioned state -Recommend increased daily activity and exercise   Patient  satisfied with Plan of action and management. All questions answered  Follow up 1 year  Total Time Spent  12 mins  Flu shot to be given today   Corrin Parker, M.D.  [facility]  Medical Director [place] Director [facility] Cardio-Pulmonary Department

## 2021-08-28 NOTE — Patient Instructions (Addendum)
Medication Instructions:  *If you need a refill on your cardiac medications before your next appointment, please call your pharmacy*  Lab Work: If you have labs (blood work) drawn today and your tests are completely normal, you will receive your results only by: [place] (if you have MyChart) OR A paper copy in the mail If you have any lab test that is abnormal or we need to change your treatment, we will call you to review the results.  Testing/Procedures: Your physician has requested that you have a lexiscan myoview. For further information please visit [email or web address]. Please follow instruction sheet, as given.  Your physician has requested that you have an echocardiogram. Echocardiography is a painless test that uses sound waves to create images of your heart. It provides your doctor with information about the size and shape of your heart and how well your heart's chambers and valves are working. This procedure takes approximately one hour. There are no restrictions for this procedure.  Your physician has recommended that you have a pulmonary function test. Pulmonary Function Tests are a group of tests that measure how well air moves in and out of your lungs.  Follow-Up: At [facility], you and your health needs are our priority.  As part of our continuing mission to provide you with exceptional heart care, we have created designated Provider Care Teams.  These Care Teams include your primary Cardiologist (physician) and Advanced Practice Providers (APPs -  Physician Assistants and Nurse Practitioners) who all work together to provide you with the care you need, when you need it.  We recommend signing up for the patient portal called "MyChart".  Sign up information is provided on this After Visit Summary.  MyChart is used to connect with patients for Virtual Visits (Telemedicine).  Patients are able to view lab/test results, encounter notes, upcoming appointments, etc.   Non-urgent messages can be sent to your provider as well.   To learn more about what you can do with MyChart, go to [email or web address].    Your next appointment:   Follow up after test are complete  The format for your next appointment:   In Person  Provider:   You may see Dr. Johnsie Cancel or one of the following Advanced Practice Providers on your designated Care Team:   Cecilie Kicks, NP

## 2021-08-29 ENCOUNTER — Ambulatory Visit (INDEPENDENT_AMBULATORY_CARE_PROVIDER_SITE_OTHER): Payer: PPO

## 2021-08-29 DIAGNOSIS — I712 Thoracic aortic aneurysm, without rupture, unspecified: Secondary | ICD-10-CM

## 2021-08-29 DIAGNOSIS — I2510 Atherosclerotic heart disease of native coronary artery without angina pectoris: Secondary | ICD-10-CM

## 2021-08-29 DIAGNOSIS — R0600 Dyspnea, unspecified: Secondary | ICD-10-CM

## 2021-08-29 LAB — ECHOCARDIOGRAM COMPLETE
AR max vel: 4.23 cm2
AV Area VTI: 4.42 cm2
AV Area mean vel: 4.68 cm2
AV Mean grad: 4 mmHg
AV Peak grad: 7.6 mmHg
Ao pk vel: 1.38 m/s
Area-P 1/2: 3.53 cm2
Calc EF: 64.6 %
S' Lateral: 4.05 cm
Single Plane A2C EF: 67.7 %
Single Plane A4C EF: 61.8 %

## 2021-09-04 ENCOUNTER — Telehealth (HOSPITAL_COMMUNITY): Payer: Self-pay | Admitting: *Deleted

## 2021-09-04 NOTE — Telephone Encounter (Signed)
Patient given detailed instructions per Myocardial Perfusion Study Information Sheet for the test on  09/11/21  Patient notified to arrive 15 minutes early and that it is imperative to arrive on time for appointment to keep from having the test rescheduled.  If you need to cancel or reschedule your appointment, please call the office within 24 hours of your appointment. . Patient verbalized understanding. Kirstie Peri

## 2021-09-11 ENCOUNTER — Ambulatory Visit (HOSPITAL_COMMUNITY): Payer: PPO | Attending: Cardiology

## 2021-09-11 ENCOUNTER — Other Ambulatory Visit: Payer: Self-pay

## 2021-09-11 DIAGNOSIS — I712 Thoracic aortic aneurysm, without rupture, unspecified: Secondary | ICD-10-CM | POA: Diagnosis not present

## 2021-09-11 DIAGNOSIS — I2510 Atherosclerotic heart disease of native coronary artery without angina pectoris: Secondary | ICD-10-CM | POA: Diagnosis not present

## 2021-09-11 DIAGNOSIS — R0600 Dyspnea, unspecified: Secondary | ICD-10-CM | POA: Diagnosis not present

## 2021-09-11 MED ORDER — TECHNETIUM TC 99M TETROFOSMIN IV KIT
32.1000 | PACK | Freq: Once | INTRAVENOUS | Status: AC | PRN
  Administered 2021-09-11: 32.1 via INTRAVENOUS
  Filled 2021-09-11: qty 33

## 2021-09-11 MED ORDER — REGADENOSON 0.4 MG/5ML IV SOLN
0.4000 mg | Freq: Once | INTRAVENOUS | Status: AC
  Administered 2021-09-11: 0.4 mg via INTRAVENOUS

## 2021-09-12 ENCOUNTER — Ambulatory Visit (HOSPITAL_COMMUNITY): Payer: PPO | Attending: Internal Medicine

## 2021-09-12 LAB — MYOCARDIAL PERFUSION IMAGING
LV dias vol: 118 mL (ref 62–150)
LV sys vol: 53 mL
Nuc Stress EF: 55 %
Peak HR: 99 {beats}/min
Rest HR: 61 {beats}/min
Rest Nuclear Isotope Dose: 29.5 mCi
SDS: 1
SRS: 0
SSS: 1
ST Depression (mm): 0 mm
Stress Nuclear Isotope Dose: 32.1 mCi
TID: 1.16

## 2021-09-12 MED ORDER — TECHNETIUM TC 99M TETROFOSMIN IV KIT
29.5000 | PACK | Freq: Once | INTRAVENOUS | Status: AC | PRN
  Administered 2021-09-12: 29.5 via INTRAVENOUS
  Filled 2021-09-12: qty 30

## 2021-09-16 ENCOUNTER — Other Ambulatory Visit: Payer: Self-pay

## 2021-09-16 ENCOUNTER — Ambulatory Visit (INDEPENDENT_AMBULATORY_CARE_PROVIDER_SITE_OTHER): Payer: PPO | Admitting: Internal Medicine

## 2021-09-16 DIAGNOSIS — I2510 Atherosclerotic heart disease of native coronary artery without angina pectoris: Secondary | ICD-10-CM

## 2021-09-16 DIAGNOSIS — I712 Thoracic aortic aneurysm, without rupture, unspecified: Secondary | ICD-10-CM | POA: Diagnosis not present

## 2021-09-16 DIAGNOSIS — R0600 Dyspnea, unspecified: Secondary | ICD-10-CM

## 2021-09-16 LAB — PULMONARY FUNCTION TEST
DL/VA % pred: 141 %
DL/VA: 5.59 ml/min/mmHg/L
DLCO cor % pred: 80 %
DLCO cor: 24.33 ml/min/mmHg
DLCO unc % pred: 80 %
DLCO unc: 24.33 ml/min/mmHg
FEF 25-75 Post: 2.19 L/sec
FEF 25-75 Pre: 2.3 L/sec
FEF2575-%Change-Post: -5 %
FEF2575-%Pred-Post: 73 %
FEF2575-%Pred-Pre: 77 %
FEV1-%Change-Post: -2 %
FEV1-%Pred-Post: 57 %
FEV1-%Pred-Pre: 58 %
FEV1-Post: 2.23 L
FEV1-Pre: 2.28 L
FEV1FVC-%Change-Post: -1 %
FEV1FVC-%Pred-Pre: 110 %
FEV6-%Change-Post: -1 %
FEV6-%Pred-Post: 55 %
FEV6-%Pred-Pre: 55 %
FEV6-Post: 2.78 L
FEV6-Pre: 2.81 L
FEV6FVC-%Pred-Post: 105 %
FEV6FVC-%Pred-Pre: 105 %
FVC-%Change-Post: -1 %
FVC-%Pred-Post: 52 %
FVC-%Pred-Pre: 53 %
FVC-Post: 2.78 L
FVC-Pre: 2.81 L
Post FEV1/FVC ratio: 80 %
Post FEV6/FVC ratio: 100 %
Pre FEV1/FVC ratio: 81 %
Pre FEV6/FVC Ratio: 100 %
RV % pred: 92 %
RV: 2.52 L
TLC % pred: 65 %
TLC: 5.28 L

## 2021-09-16 NOTE — Patient Instructions (Signed)
Full PFT performed today. °

## 2021-09-16 NOTE — Progress Notes (Signed)
Full PFT performed today. °

## 2021-09-23 NOTE — Progress Notes (Signed)
CARDIOLOGY CONSULT NOTE       Patient ID: Russell Garrett MRN: [id number] DOB/AGE: 01/03/1951 70 y.o.  Admit date: (Not on file) Referring Physician: Orvan Seen  Primary Physician: Venia Carbon, MD Primary Cardiologist: New Reason for Consultation: CAD Risk/Aneurysm/Dyspnea   Active Problems:   * No active hospital problems. *   HPI:  70 y.o. referred by Dr Silvio Pate for aortic aneurysm and CAD risk. History of PE prior to COVID infection earlier this year.  OSA HLD and HTN.  He follows with Dr Orvan Seen CVTS for his anuerysm.  CTA done 05/16/21 ascending aorta 4.7 cm  Noted some calcifications in LAD  Lung parenchyma with only mild linear atelectasis /scarring right lung base Echo done 02/26/18 showed EF 55-60% no LVH and tri leaflet AV He had a normal ETT 08/01/15   Since COVID beginning of year he has had dyspnea persist Dr Orvan Seen concerned that SOB Not just from [place] His PE was diagnosed in 02/25/18 with small burden in left lower lobe with no RV dilatation or hepatic reflux Hct is stable 43.6 LDL 103 He did have recurrent pneumonia's in 2008 and saw Dr Arlyce Dice which probably explains the RLL scarring 35 pack year history of smoking quit in 2017   BP well controlled On chronic eliquis for PE On statin for HLD  Remarried last 9 years to high school friend He is overweight Likes to hunt and does walk his dog regularly   Having more dyspnea W/U does not indicate cardiac etiology   TTE done 08/29/21 EF 60-65% mild MR Ao 41 mm  Myovue 08/29/21 normal no ischemia EF 55%  PFT;s 09/16/21 severe restriction high DLCO no bronchodilator response  Sees a doctor in [place] for OSA/CPAP    ROS All other systems reviewed and negative except as noted above  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   Diverticulitis    pt denies   Hyperlipidemia    Hypertension    Mild ascending aorta dilatation (HCC)    4.8cm in  diamter per CT chest, see imaging in epic dated 02-25-18   OSA (obstructive sleep apnea)    Pulmonary air embolism ([place]) 02/2018   lifetime eliquis since then    Sleep apnea    CPAP    Family History  Problem Relation Age of Onset   Hypertension Mother    Heart disease Mother    Kidney disease Mother    Heart disease Father    Stroke Father    Prostate cancer Father    Kidney disease Father    Heart disease Brother        cardioversion for arrhythmia   Diabetes Neg Hx    Colon cancer Neg Hx     Social History   Socioeconomic History   Marital status: Married    Spouse name: Not on file   Number of children: 2   Years of education: Not on file   Highest education level: Not on file  Occupational History   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  Tobacco Use   Smoking status: Former    Packs/day: 1.00    Years: 35.00    Pack years: 35.00    Types: Cigarettes    Quit date: 12/16/2015    Years since quitting: 5.7   Smokeless tobacco: Never  Vaping Use   Vaping Use: Never used  Substance and Sexual Activity   Alcohol use: Yes    Alcohol/week: 0.0 standard drinks    Comment: occ   Drug use: No   Sexual activity: Not on file  Other Topics Concern   Not on file  Social History Narrative   Widowed   Remarried June 2014      Has living will   Wife is health care POA--- alternate is daughter, Magda Paganini   Would accept resuscitation    Probably no tube feeds if cognitively unaware.   Social Determinants of Health   Financial Resource Strain: Not on file  Food Insecurity: Not on file  Transportation Needs: Not on file  Physical Activity: Not on file  Stress: Not on file  Social Connections: Not on file  Intimate Partner Violence: Not on file    Past Surgical History:  Procedure Laterality Date   BACK SURGERY     COLONOSCOPY     THORACENTESIS  08/2006   left pleural effusion   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   Procedure: RIGHT TOTAL HIP  ARTHROPLASTY ANTERIOR APPROACH;  Surgeon: Gaynelle Arabian, MD;  Location: [facility];  Service: Orthopedics;  Laterality: Right;  162min      Current Outpatient Medications:    Ascorbic Acid (VITAMIN C) 1000 MG tablet, Take 1,000 mg by mouth daily., Disp: , Rfl:    atorvastatin (LIPITOR) 40 MG tablet, Take 1 tablet (40 mg total) by mouth daily., Disp: 90 tablet, Rfl: 3   ELIQUIS 5 MG TABS tablet, TAKE ONE TABLET BY MOUTH TWICE DAILY, Disp: 180 tablet, Rfl: 3   hydrochlorothiazide (HYDRODIURIL) 12.5 MG tablet, Take 1 tablet (12.5 mg total) by mouth daily., Disp: 90 tablet, Rfl: 3   losartan (COZAAR) 50 MG tablet, Take 1 tablet (50 mg total) by mouth daily., Disp: 90 tablet, Rfl: 3   VITAMIN D PO, Take by mouth., Disp: , Rfl:     Physical Exam: Blood pressure 120/64, pulse 72, height 6\' 3"  (1.905 m), weight (!) 302 lb (137 kg).    Affect appropriate Healthy:  appears stated age 70: normal Neck supple with no adenopathy JVP normal no bruits no thyromegaly Lungs clear with no wheezing and good diaphragmatic motion Heart:  S1/S2 no murmur, no rub, gallop or click PMI normal Abdomen: benighn, BS positve, no tenderness, no AAA no bruit.  No HSM or HJR Distal pulses intact with no bruits No edema Neuro non-focal Skin warm and dry No muscular weakness   Labs:   Lab Results  Component Value Date   WBC 4.1 04/26/2021   HGB 14.8 04/26/2021   HCT 43.6 04/26/2021   MCV 89.5 04/26/2021   PLT 159.0 04/26/2021   No results for input(s): NA, K, CL, CO2, BUN, CREATININE, CALCIUM, PROT, BILITOT, ALKPHOS, ALT, AST, GLUCOSE in the last 168 hours.  Invalid input(s): LABALBU Lab Results  Component Value Date   TROPONINI <0.03 02/25/2018    Lab Results  Component Value Date   CHOL 176 04/26/2021   CHOL 151 04/24/2020   CHOL 176 03/17/2017   Lab Results  Component Value Date   HDL 57.40 04/26/2021   HDL 54.60 04/24/2020   HDL 47.80 03/17/2017   Lab Results  Component Value Date    LDLCALC 103 (H) 04/26/2021   LDLCALC 86 04/24/2020   LDLCALC 115 (H) 03/17/2017   Lab Results  Component Value Date   TRIG 75.0 04/26/2021   TRIG 52.0 04/24/2020   TRIG 69.0 03/17/2017   Lab Results  Component Value  Date   CHOLHDL 3 04/26/2021   CHOLHDL 3 04/24/2020   CHOLHDL 4 03/17/2017   Lab Results  Component Value Date   LDLDIRECT 161.3 02/03/2011   LDLDIRECT 149.5 02/12/2010   LDLDIRECT 180.9 01/26/2008      Radiology: MYOCARDIAL PERFUSION IMAGING  Result Date: 09/12/2021   The study is normal. The study is low risk.   No ST deviation was noted.   LV perfusion is normal. There is no evidence of ischemia.   Left ventricular function is normal. Nuclear stress EF: 55 %. The left ventricular ejection fraction is normal (55-65%). End diastolic cavity size is normal. End systolic cavity size is mildly enlarged.   Prior study not available for comparison. Findings: Negative for stress induced arrhythmias. No evidence of ischemia or infarction. Conclusions: Stress test is negative. This is a low risk study.  ECHOCARDIOGRAM COMPLETE  Result Date: 08/29/2021    ECHOCARDIOGRAM REPORT   Patient Name:   Russell Garrett Reth Date of Exam: 08/29/2021 Medical Rec #:  [id number]    Height:       74.0 in Accession #:    [id number]   Weight:       300.8 lb Date of Birth:  Sep 24, 1951    BSA:          2.585 m Patient Age:    27 years     BP:           130/85 mmHg Patient Gender: M            HR:           65 bpm. Exam Location:  [facility] Procedure: 2D Echo, Cardiac Doppler and Color Doppler Indications:    I71.2 Ascending aortic aneurysm; R06.9 DOE  History:        Patient has prior history of Echocardiogram examinations, most                 recent 02/26/2018. CAD, Signs/Symptoms:Shortness of Breath and                 Dyspnea; Risk Factors:Hypertension, Sleep Apnea, Dyslipidemia                 and Former Smoker.  Sonographer:    Caesar Chestnut RDCS, RVT Referring Phys: [place]  1.  Left ventricular ejection fraction, by estimation, is 60 to 65%. The left ventricle has normal function. The left ventricle has no regional wall motion abnormalities. The left ventricular internal cavity size was mildly dilated. Left ventricular diastolic parameters are consistent with Grade I diastolic dysfunction (impaired relaxation).  2. Right ventricular systolic function is normal. The right ventricular size is normal.  3. The mitral valve is normal in structure. Mild mitral valve regurgitation. No evidence of mitral stenosis.  4. The aortic valve was not well visualized. Unable to determine number of leaflets. Aortic valve regurgitation is not visualized. No aortic stenosis is present.  5. There is mild dilatation of the aortic root, measuring 41 mm. There is mild dilatation of the ascending aorta, measuring 43 mm. Aortic arch 2.8 cm  6. The inferior vena cava is normal in size with greater than 50% respiratory variability, suggesting right atrial pressure of 3 mmHg. Comparison(s): LVEF 55-60% Asc. Ao 4.1 cm. FINDINGS  Left Ventricle: Left ventricular ejection fraction, by estimation, is 60 to 65%. The left ventricle has normal function. The left ventricle has no regional wall motion abnormalities. The left ventricular internal cavity size was mildly dilated. There  is  no left ventricular hypertrophy. Left ventricular diastolic parameters are consistent with Grade I diastolic dysfunction (impaired relaxation). Right Ventricle: The right ventricular size is normal. No increase in right ventricular wall thickness. Right ventricular systolic function is normal. Left Atrium: Left atrial size was normal in size. Right Atrium: Right atrial size was normal in size. Pericardium: There is no evidence of pericardial effusion. Mitral Valve: The mitral valve is normal in structure. Mild mitral valve regurgitation. No evidence of mitral valve stenosis. Tricuspid Valve: The tricuspid valve is normal in structure. Tricuspid  valve regurgitation is not demonstrated. No evidence of tricuspid stenosis. Aortic Valve: The aortic valve was not well visualized. Aortic valve regurgitation is not visualized. No aortic stenosis is present. Aortic valve mean gradient measures 4.0 mmHg. Aortic valve peak gradient measures 7.6 mmHg. Aortic valve area, by VTI measures 4.42 cm. Pulmonic Valve: The pulmonic valve was normal in structure. Pulmonic valve regurgitation is not visualized. No evidence of pulmonic stenosis. Aorta: The aortic root is normal in size and structure. There is mild dilatation of the aortic root, measuring 41 mm. There is mild dilatation of the ascending aorta, measuring 43 mm. Venous: The inferior vena cava is normal in size with greater than 50% respiratory variability, suggesting right atrial pressure of 3 mmHg. IAS/Shunts: No atrial level shunt detected by color flow Doppler.  LEFT VENTRICLE PLAX 2D LVIDd:         5.60 cm      Diastology LVIDs:         4.05 cm      LV e' medial:    8.70 cm/s LV PW:         1.05 cm      LV E/e' medial:  10.4 LV IVS:        1.15 cm      LV e' lateral:   11.90 cm/s LVOT diam:     2.40 cm      LV E/e' lateral: 7.6 LV SV:         131 LV SV Index:   51 LVOT Area:     4.52 cm  LV Volumes (MOD) LV vol d, MOD A2C: 109.0 ml LV vol d, MOD A4C: 117.0 ml LV vol s, MOD A2C: 35.2 ml LV vol s, MOD A4C: 44.7 ml LV SV MOD A2C:     73.8 ml LV SV MOD A4C:     117.0 ml LV SV MOD BP:      73.2 ml RIGHT VENTRICLE RV Basal diam:  4.40 cm RV Mid diam:    3.60 cm RV S prime:     14.70 cm/s TAPSE (M-mode): 3.2 cm LEFT ATRIUM           Index        RIGHT ATRIUM           Index LA Vol (A2C): 33.9 ml 13.11 ml/m  RA Area:     12.60 cm LA Vol (A4C): 35.9 ml 13.89 ml/m  RA Volume:   27.40 ml  10.60 ml/m  AORTIC VALVE                    PULMONIC VALVE AV Area (Vmax):    4.23 cm     PV Vmax:       0.89 m/s AV Area (Vmean):   4.68 cm     PV Vmean:      53.200 cm/s AV Area (VTI):     4.42 cm  PV VTI:        0.174 m AV  Vmax:           138.00 cm/s  PV Peak grad:  3.2 mmHg AV Vmean:          91.700 cm/s  PV Mean grad:  1.0 mmHg AV VTI:            0.296 m AV Peak Grad:      7.6 mmHg AV Mean Grad:      4.0 mmHg LVOT Vmax:         129.00 cm/s LVOT Vmean:        94.800 cm/s LVOT VTI:          0.289 m LVOT/AV VTI ratio: 0.98  AORTA Ao Root diam: 4.10 cm Ao Asc diam:  4.27 cm Ao Arch diam: 2.8 cm MITRAL VALVE               TRICUSPID VALVE MV Area (PHT): 3.53 cm    TR Peak grad:   4.2 mmHg MV Decel Time: 215 msec    TR Vmax:        102.00 cm/s MV E velocity: 90.30 cm/s MV A velocity: 97.40 cm/s  SHUNTS MV E/A ratio:  0.93        Systemic VTI:  0.29 m                            Systemic Diam: 2.40 cm Ida Rogue MD Electronically signed by Ida Rogue MD Signature Date/Time: 08/29/2021/2:56:19 PM    Final     EKG: SR rate 83 LAD 11/28/20    ASSESSMENT AND PLAN:   Dyspnea:  likely combination of previous smoking, pneumonia, scarring, COVID and history of PE. Refer to pulmonary for restrictive lung dx CAD:  calcification of LAD on CT normal ETT 2016 and normal recent myovue 09/12/21 HLD:  on lipitor 40 mg LDL 103  HTN:  Well controlled.  Continue current medications and low sodium Dash type diet.   Aortic aneurysm:  f/u CVTS Dr Orvan Seen CTA June 2023 use gated chest protocol 4.7 cm stable by CT 05/16/21 OSA:  continue CPAP use discussed weight loss   Refer to pulmonary for restrictive lung dx  F/U in a year    Signed: Jenkins Rouge 09/27/2021, 10:09 AM

## 2021-09-27 ENCOUNTER — Other Ambulatory Visit: Payer: Self-pay

## 2021-09-27 ENCOUNTER — Ambulatory Visit: Payer: PPO | Admitting: Cardiovascular Disease

## 2021-09-27 VITALS — BP 120/64 | HR 72 | Ht 75.0 in | Wt 302.0 lb

## 2021-09-27 DIAGNOSIS — J984 Other disorders of lung: Secondary | ICD-10-CM

## 2021-09-27 DIAGNOSIS — I7123 Aneurysm of the descending thoracic aorta, without rupture: Secondary | ICD-10-CM | POA: Diagnosis not present

## 2021-09-27 DIAGNOSIS — R0609 Other forms of dyspnea: Secondary | ICD-10-CM | POA: Diagnosis not present

## 2021-09-27 DIAGNOSIS — I7121 Aneurysm of the ascending aorta, without rupture: Secondary | ICD-10-CM

## 2021-09-27 DIAGNOSIS — I10 Essential (primary) hypertension: Secondary | ICD-10-CM | POA: Diagnosis not present

## 2021-09-27 NOTE — Patient Instructions (Addendum)
Medication Instructions:  NO CHANGES *If you need a refill on your cardiac medications before your next appointment, please call your pharmacy*   Lab Work: NONE If you have labs (blood work) drawn today and your tests are completely normal, you will receive your results only by: [place] (if you have MyChart) OR A paper copy in the mail If you have any lab test that is abnormal or we need to change your treatment, we will call you to review the results.   Testing/Procedures: NONE   Follow-Up: At [facility], you and your health needs are our priority.  As part of our continuing mission to provide you with exceptional heart care, we have created designated Provider Care Teams.  These Care Teams include your primary Cardiologist (physician) and Advanced Practice Providers (APPs -  Physician Assistants and Nurse Practitioners) who all work together to provide you with the care you need, when you need it.  We recommend signing up for the patient portal called "MyChart".  Sign up information is provided on this After Visit Summary.  MyChart is used to connect with patients for Virtual Visits (Telemedicine).  Patients are able to view lab/test results, encounter notes, upcoming appointments, etc.  Non-urgent messages can be sent to your provider as well.   To learn more about what you can do with MyChart, go to [email or web address].    Your next appointment:   12 month(s)  The format for your next appointment:   In Person  Provider:    DR Johnsie Cancel     Other Instructions REFERRAL TO PULMONARY   REFERRAL TO DR Roxan Hockey

## 2021-10-08 ENCOUNTER — Other Ambulatory Visit: Payer: Self-pay

## 2021-10-08 ENCOUNTER — Ambulatory Visit: Payer: PPO | Admitting: Internal Medicine

## 2021-10-08 ENCOUNTER — Encounter: Payer: Self-pay | Admitting: Internal Medicine

## 2021-10-08 VITALS — BP 130/68 | HR 68 | Temp 97.5°F | Ht 75.0 in | Wt 300.2 lb

## 2021-10-08 DIAGNOSIS — Z87891 Personal history of nicotine dependence: Secondary | ICD-10-CM | POA: Diagnosis not present

## 2021-10-08 DIAGNOSIS — Z9989 Dependence on other enabling machines and devices: Secondary | ICD-10-CM | POA: Diagnosis not present

## 2021-10-08 DIAGNOSIS — G4733 Obstructive sleep apnea (adult) (pediatric): Secondary | ICD-10-CM

## 2021-10-08 DIAGNOSIS — R0602 Shortness of breath: Secondary | ICD-10-CM | POA: Diagnosis not present

## 2021-10-08 DIAGNOSIS — I7121 Aneurysm of the ascending aorta, without rupture: Secondary | ICD-10-CM | POA: Diagnosis not present

## 2021-10-08 NOTE — Progress Notes (Signed)
Russell Garrett    [id number]    Sep 19, 1951  Primary Care Physician:Letvak, Theophilus Kinds, MD Date of Appointment: 10/08/2021 Established Patient Visit  Chief complaint:   Chief Complaint  Patient presents with   Follow-up     HPI: Russell Garrett is a 70 y.o. man with OSA on CPAP.   Interval Updates: Here for establishing care with me for dyspnea.   Notes worsening dyspnea on exertion for the past year after he had Covid. He denies coughing, chest tightness, wheezing associated with this. Worse with exertion, improves with rest. Symptoms lasts for 1-2 minutes. He does everything he wants or needs to do, but it takes him longer.   Had pneumonia once 10-15 years ago. And has stable RLL scarring in his lungs.   He is following with CVTS and cardiology regarding a thoracic aortic aneurysm which is at 4.7 cm in diameter.   CPAP download reviewed - 100% adherence between 8/17 to 11/14 with excellent suppression of AHI. Some leak which patient says was positional while having a URI.   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 Lives in [place]. Lives at home with wife, pet dog.   30+ pack year smoking history. Quit in 2017.  I have reviewed the patient's family social and past medical history and updated as appropriate.   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   Diverticulitis    pt denies   Hyperlipidemia    Hypertension    Mild ascending aorta dilatation (HCC)    4.8cm in diamter per CT chest, see imaging in epic dated 02-25-18   OSA (obstructive sleep apnea)    Pulmonary air embolism ([place]) 02/2018   lifetime eliquis since then    Sleep apnea    CPAP    Past Surgical History:  Procedure Laterality Date   BACK SURGERY     COLONOSCOPY     THORACENTESIS  08/2006   left pleural effusion   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   Procedure: RIGHT TOTAL HIP ARTHROPLASTY ANTERIOR  APPROACH;  Surgeon: Gaynelle Arabian, MD;  Location: [facility];  Service: Orthopedics;  Laterality: Right;  160min    Family History  Problem Relation Age of Onset   Hypertension Mother    Heart disease Mother    Kidney disease Mother    Heart disease Father    Stroke Father    Prostate cancer Father    Kidney disease Father    Heart disease Brother        cardioversion for arrhythmia   Diabetes Neg Hx    Colon cancer Neg Hx     Social History   Occupational History   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  Gen:      No acute distress, obese ENT:  no nasal polyps, mucus membranes moist Lungs:    No increased respiratory effort, symmetric chest wall excursion, clear to auscultation bilaterally, no wheezes or crackles CV:         Regular rate and rhythm; no murmurs, rubs, or gallops.  No pedal edema   Data Reviewed: Imaging: I have personally reviewed the CT Chest June 2022 which shows stable RLL  post infectious scarring and a ascending aortic aneurysm. No emphysema. No nodules.   PFTs:   PFT Results Latest Ref Rng & Units 09/16/2021  FVC-Pre L 2.81  FVC-Predicted Pre % 53  FVC-Post L 2.78  FVC-Predicted Post % 52  Pre FEV1/FVC % % 81  Post FEV1/FCV % % 80  FEV1-Pre L 2.28  FEV1-Predicted Pre % 58  FEV1-Post L 2.23  DLCO uncorrected ml/min/mmHg 24.33  DLCO UNC% % 80  DLCO corrected  ml/min/mmHg 24.33  DLCO COR %Predicted % 80  DLVA Predicted % 141  TLC L 5.28  TLC % Predicted % 65  RV % Predicted % 92   I have personally reviewed the patient's PFTs and moderately severe restriction to ventilation secondary to body habitus.   Labs: Lab Results  Component Value Date   WBC 4.1 04/26/2021   HGB 14.8 04/26/2021   HCT 43.6 04/26/2021   MCV 89.5 04/26/2021   PLT 159.0 04/26/2021   Lab Results  Component Value Date   NA 137 04/26/2021   K 4.0 04/26/2021   CL 102 04/26/2021   CO2 26 04/26/2021    Immunization status: Immunization History  Administered Date(s) Administered   Fluad Quad(high Dose 65+) 07/27/2019, 08/16/2020, 08/28/2021   H1N1 02/09/2009   Influenza Split 08/06/2011, 08/18/2012, 10/09/2015   Influenza Whole 08/16/2010   Influenza,inj,Quad PF,6+ Mos 09/20/2013, 10/04/2016, 09/25/2018   Influenza,inj,quad, With Preservative 11/23/2017   Influenza-Unspecified 09/24/2016   PFIZER(Purple Top)SARS-COV-2 Vaccination 01/07/2020, 01/31/2020, 11/22/2020   Pneumococcal Conjugate-13 03/17/2017   Pneumococcal Polysaccharide-23 03/19/2018   Pneumococcal-Unspecified 02/22/2013   Td 12/06/1996, 01/25/2008   Tdap 03/19/2018   Zoster Recombinat (Shingrix) 05/02/2021   Zoster, Live 03/11/2013    Assessment:  Dyspnea OSA on CPAP Ascending Aortic Aneurysm History of Tobacco use  Plan/Recommendations: OSA controlled on current settings. Continue CPAP Dyspnea correlates strongly with his 60 lb weight gain over the last year, coupled with his decreased activity. Reviewed PFTs with him which show restriction to ventilation secondary to body habitus.  Fortunately I do not think he has any evidence of smoking related lung disease.  Continue to abstain from tobacco.  I don't think a trial of inhaler therapy would be necessarily useful at this time.  He has already had his flu shot.   I spent 30 minutes on 10/08/2021 in care of this patient including face to  face time and non-face to face time spent charting, review of outside records, and coordination of care.   Return to Care: Return in about 1 year (around 10/08/2022). For OSA.    Lenice Llamas, MD Pulmonary and [place]

## 2021-10-08 NOTE — Patient Instructions (Signed)
Please schedule follow up scheduled with myself in 12 months.  If my schedule is not open yet, we will contact you with a reminder closer to that time.

## 2021-10-22 ENCOUNTER — Other Ambulatory Visit: Payer: Self-pay

## 2021-10-22 ENCOUNTER — Institutional Professional Consult (permissible substitution): Payer: PPO | Admitting: Physician Assistant

## 2021-10-22 VITALS — BP 132/72 | HR 62 | Resp 20 | Ht 74.0 in | Wt 301.0 lb

## 2021-10-22 DIAGNOSIS — I7121 Aneurysm of the ascending aorta, without rupture: Secondary | ICD-10-CM

## 2021-10-22 NOTE — Progress Notes (Signed)
[address]       [place] [address]             [phone]        Russell Garrett [id number] 1951-05-25  History of Present Illness:  Russell Garrett is a 70 yo male with history of HLD, OSA, H/O PE, Obesity, Dyspnea, and Ascending aortic aneurysm.  The patient states he has had an aneurysm for over 15 years which has always remained stable.  He was last seen by Dr. Orvan Seen in June at which time the patient was complaining of progressive dyspnea.  There was concerned this was not related to this COVID 19 infection and warranted further workup.  The patient underwent cardiac workup by Dr. Johnsie Cancel which did not reveal any evidence of ischemic heart disease.  Echocardiogram showed only mild MR.  He was referred to Pulmonary medicine for evaluation and this also showed no source of dyspnea.  The patient believes his weight gain is the source of his shortness of breath.  He states ever since he had COVID he doesn't do anything.  He used to play golf 3 times per week and no longer does this.  He admits to gaining 60 lbs in the past year.  He states if he gets short of breath and takes a break it resolves.  He denies chest pain.  He is accompanied by his wife who is planning to help patient achieve weight loss which will hopefully improve how he feels overall.  He is a former smoker and quit back in 2015-16.  He was referred to the office today to meet Dr. Roxan Hockey as Dr. Orvan Seen is no longer with our practice.  Current Outpatient Medications on File Prior to Visit  Medication Sig Dispense Refill   Ascorbic Acid (VITAMIN C) 1000 MG tablet Take 1,000 mg by mouth daily.     atorvastatin (LIPITOR) 40 MG tablet Take 1 tablet (40 mg total) by mouth daily. 90 tablet 3   ELIQUIS 5 MG TABS tablet TAKE ONE TABLET BY MOUTH TWICE DAILY 180 tablet 3   hydrochlorothiazide (HYDRODIURIL) 12.5 MG tablet Take 1 tablet (12.5 mg total) by mouth daily. 90 tablet 3   losartan (COZAAR) 50 MG tablet Take 1  tablet (50 mg total) by mouth daily. 90 tablet 3   VITAMIN D PO Take by mouth.     [DISCONTINUED] lisinopril-hydrochlorothiazide (PRINZIDE,ZESTORETIC) 10-12.5 MG per tablet Take 1 tablet by mouth daily. 30 tablet 11   No current facility-administered medications on file prior to visit.   BP 132/72 (BP Location: Right Arm, Patient Position: Sitting)   Pulse 62   Resp 20   Ht 6\' 2"  (1.88 m)   Wt (!) 301 lb (136.5 kg)   SpO2 95% Comment: RA  BMI 38.65 kg/m   Physical Exam  Gen: no apparent distress Heart: RRR Lungs: CTA bilaterally  Abd: soft non-tender, non-distended Ext: no edema Neuro: grossly normal  CTA Results:  IMPRESSION: 1. Aneurysmal dilatation of the ascending aorta measuring up to 4.7 cm in maximal axial diameter. Ascending thoracic aortic aneurysm. Recommend semi-annual imaging followup by CTA or MRA and referral to cardiothoracic surgery if not already obtained. This recommendation follows 2010 ACCF/AHA/AATS/ACR/ASA/SCA/SCAI/SIR/STS/SVM Guidelines for the Diagnosis and Management of Patients With Thoracic Aortic Disease. Circulation. 2010; 121: G283-M629. Aortic aneurysm NOS (ICD10-I71.9) 2.  Aortic Atherosclerosis (ICD10-I70.0).  A/P:  Ascending Aortic Aneurysm- this has been stable on review of several scans at 4.7-4.8 HLD- continue statin  Morbid Obesity OSA Dyspnea- cardiac and pulmonary workup negative Dispo- patient with stable 4.8 cm aneurysm, will have him RTC in 6  months with CTA chest with Dr. Roxan Hockey  Risk Modification:  Statin: yes, H/O HLD  Smoking cessation instruction/counseling given:  commended patient for quitting and reviewed strategies for preventing relapses  Patient was counseled on importance of Blood Pressure Control.  Despite Medical intervention if the patient notices persistently elevated blood pressure readings.  They are instructed to contact their Primary Care Physician  Please avoid use of Fluoroquinolones as this can  potentially increase your risk of Aortic Rupture and/or Dissection  Patient educated on signs and symptoms of Aortic Dissection, handout also provided in AVS  Devika Dragovich, PA-C 10/22/21

## 2021-10-22 NOTE — Patient Instructions (Signed)
Patient is counseled regarding the importance of long term risk factor modification as they pertain to the presence of ischemic heart disease including avoiding the use of all tobacco products, dietary modifications and medical therapy for diabetes, cholesterol and lipid management, and regular exercise.     AVOID FLOUROQUINOLONES-- increases risk of aortic dissection

## 2022-01-23 ENCOUNTER — Other Ambulatory Visit: Payer: Self-pay

## 2022-01-23 ENCOUNTER — Ambulatory Visit (INDEPENDENT_AMBULATORY_CARE_PROVIDER_SITE_OTHER)
Admission: RE | Admit: 2022-01-23 | Discharge: 2022-01-23 | Disposition: A | Discharge status: Home / Self Care | Payer: PPO | Source: Ambulatory Visit | Attending: Family | Admitting: Family

## 2022-01-23 ENCOUNTER — Ambulatory Visit (INDEPENDENT_AMBULATORY_CARE_PROVIDER_SITE_OTHER): Payer: PPO | Admitting: Family

## 2022-01-23 ENCOUNTER — Encounter: Payer: Self-pay | Admitting: Family

## 2022-01-23 ENCOUNTER — Other Ambulatory Visit: Payer: Self-pay | Admitting: Family

## 2022-01-23 VITALS — BP 162/90 | HR 76 | Temp 98.0°F | Ht 75.0 in | Wt 300.0 lb

## 2022-01-23 DIAGNOSIS — R0989 Other specified symptoms and signs involving the circulatory and respiratory systems: Secondary | ICD-10-CM

## 2022-01-23 DIAGNOSIS — R062 Wheezing: Secondary | ICD-10-CM

## 2022-01-23 DIAGNOSIS — J209 Acute bronchitis, unspecified: Secondary | ICD-10-CM | POA: Insufficient documentation

## 2022-01-23 DIAGNOSIS — R0602 Shortness of breath: Secondary | ICD-10-CM | POA: Diagnosis not present

## 2022-01-23 LAB — BASIC METABOLIC PANEL
BUN: 17 mg/dL (ref 6–23)
CO2: 30 mEq/L (ref 19–32)
Calcium: 9.3 mg/dL (ref 8.4–10.5)
Chloride: 103 mEq/L (ref 96–112)
Creatinine, Ser: 1.07 mg/dL (ref 0.40–1.50)
GFR: 70.27 mL/min (ref >60.00)
Glucose, Bld: 97 mg/dL (ref 70–99)
Potassium: 4 mEq/L (ref 3.5–5.1)
Sodium: 139 mEq/L (ref 135–145)

## 2022-01-23 LAB — CBC
HCT: 40.7 % (ref 39.0–52.0)
Hemoglobin: 14 g/dL (ref 13.0–17.0)
MCHC: 34.3 g/dL (ref 30.0–36.0)
MCV: 89.8 fl (ref 78.0–100.0)
Platelets: 185 10*3/uL (ref 150.0–400.0)
RBC: 4.54 Mil/uL (ref 4.22–5.81)
RDW: 13.9 % (ref 11.5–15.5)
WBC: 8.5 10*3/uL (ref 4.0–10.5)

## 2022-01-23 LAB — BRAIN NATRIURETIC PEPTIDE: Pro B Natriuretic peptide (BNP): 17 pg/mL (ref 0.0–100.0)

## 2022-01-23 MED ORDER — PREDNISONE 20 MG PO TABS
ORAL_TABLET | ORAL | 0 refills | Status: DC
Start: 2022-01-23 — End: 2022-02-05

## 2022-01-23 MED ORDER — ALBUTEROL SULFATE HFA 108 (90 BASE) MCG/ACT IN AERS: 2.0000 | INHALATION_SPRAY | Freq: Four times a day (QID) | RESPIRATORY_TRACT | 0 refills | Status: DC | PRN

## 2022-01-23 MED ORDER — AMOXICILLIN-POT CLAVULANATE 875-125 MG PO TABS: 1.0000 | ORAL_TABLET | Freq: Two times a day (BID) | ORAL | 0 refills | Status: DC

## 2022-01-23 NOTE — Progress Notes (Signed)
Established Patient Office Visit  Subjective:  Patient ID: Russell Garrett, male    DOB: 01-Jun-1951  Age: 71 y.o. MRN: [id number]  CC:  Chief Complaint  Patient presents with   Nasal Congestion    Pt stated--chest congested, coughing with green mucus.--4 weeks Tried Mucenix it help Some.    HPI Russell Garrett is here today with concerns.   C/o chest congestion, cough with green mucous. Nasal congestion.  Tried mucinex with some relief. No sob/ no more than normal.  Can't seem to shake this off.   No ear pain no sore throat no sinus pressure Sneezing on occasion, 6-7 times in a row. Once daily.  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   Procedure: RIGHT TOTAL HIP ARTHROPLASTY ANTERIOR APPROACH;  Surgeon: Gaynelle Arabian, MD;  Location: [facility];  Service: Orthopedics;  Laterality: Right;  158min    Family History  Problem Relation Age of Onset   Hypertension Mother    Heart disease Mother    Kidney disease Mother    Heart disease Father    Stroke Father    Prostate cancer Father    Kidney disease Father    Heart disease Brother        cardioversion for arrhythmia   Diabetes Neg Hx    Colon cancer Neg Hx     Social History   Socioeconomic History   Marital status: Married    Spouse name: Not on file   Number of children: 2   Years of education: Not on file   Highest education level: Not on  file  Occupational History   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  Tobacco Use   Smoking status: Former    Packs/day: 1.00    Years: 35.00    Pack years: 35.00    Types: Cigarettes    Quit date: 12/16/2015    Years since quitting: 6.1   Smokeless tobacco: Never  Vaping Use   Vaping Use: Never used  Substance and Sexual Activity   Alcohol use: Yes    Alcohol/week: 0.0 standard drinks    Comment: occ   Drug use: No   Sexual activity: Not on file  Other Topics Concern   Not on file  Social History Narrative   Widowed   Remarried June 2014      Has living will   Wife is health care POA--- alternate is daughter, Magda Paganini   Would accept resuscitation    Probably no tube feeds if cognitively unaware.   Social Determinants of Health   Financial Resource Strain: Not on file  Food Insecurity: Not on file  Transportation Needs: Not on file  Physical Activity: Not on file  Stress: Not on  file  Social Connections: Not on file  Intimate Partner Violence: Not on file    Outpatient Medications Prior to Visit  Medication Sig Dispense Refill   Ascorbic Acid (VITAMIN C) 1000 MG tablet Take 1,000 mg by mouth daily.     atorvastatin (LIPITOR) 40 MG tablet Take 1 tablet (40 mg total) by mouth daily. 90 tablet 3   ELIQUIS 5 MG TABS tablet TAKE ONE TABLET BY MOUTH TWICE DAILY 180 tablet 3   hydrochlorothiazide (HYDRODIURIL) 12.5 MG tablet Take 1 tablet (12.5 mg total) by mouth daily. 90 tablet 3   losartan (COZAAR) 50 MG tablet Take 1 tablet (50 mg total) by mouth daily. 90 tablet 3   VITAMIN D PO Take by mouth.     No facility-administered medications prior to visit.    No Known Allergies  ROS Review of Systems  Constitutional:  Negative for chills and fever.  HENT:  Positive for congestion and postnasal drip. Negative for ear pain, sinus pain and sore throat.   Respiratory:  Positive for cough and chest tightness. Negative for shortness of breath and wheezing.    Cardiovascular:  Negative for chest pain and palpitations.     Objective:    Physical Exam Constitutional:      General: He is not in acute distress.    Appearance: Normal appearance. He is obese. He is not ill-appearing, toxic-appearing or diaphoretic.  HENT:     Head: Normocephalic.     Right Ear: Tympanic membrane normal.     Left Ear: Tympanic membrane normal.     Nose: Nose normal.     Mouth/Throat:     Mouth: Mucous membranes are moist.  Eyes:     Pupils: Pupils are equal, round, and reactive to light.  Cardiovascular:     Rate and Rhythm: Normal rate and regular rhythm.  Pulmonary:     Effort: Pulmonary effort is normal. No respiratory distress.     Breath sounds: Stridor present. Wheezing (inspiratory wheezing) and rhonchi (exp rhonchi all quadrants) present. No rales.     Comments: Decreased breath sounds right lower lobe  Neurological:     Mental Status: He is alert.    BP (!) 162/90    Pulse 76    Temp 98 F (36.7 C)    Ht 6\' 3"  (1.905 m)    Wt 300 lb (136.1 kg)    SpO2 98%    BMI 37.50 kg/m  Wt Readings from Last 3 Encounters:  01/23/22 300 lb (136.1 kg)  10/22/21 (!) 301 lb (136.5 kg)  10/08/21 (!) 300 lb 3.2 oz (136.2 kg)     Health Maintenance Due  Topic Date Due   COVID-19 Vaccine (4 - Booster for Pfizer series) 01/17/2021   Zoster Vaccines- Shingrix (2 of 2) 06/27/2021    There are no preventive care reminders to display for this patient.  Lab Results  Component Value Date   TSH 1.72 03/03/2014   Lab Results  Component Value Date   WBC 4.1 04/26/2021   HGB 14.8 04/26/2021   HCT 43.6 04/26/2021   MCV 89.5 04/26/2021   PLT 159.0 04/26/2021   Lab Results  Component Value Date   NA 137 04/26/2021   K 4.0 04/26/2021   CO2 26 04/26/2021   GLUCOSE 90 04/26/2021   BUN 23 04/26/2021   CREATININE 1.15 04/26/2021   BILITOT 0.7 04/26/2021   ALKPHOS 65 04/26/2021   AST 25 04/26/2021   ALT 24 04/26/2021   PROT 7.8 04/26/2021  ALBUMIN 4.2  04/26/2021   CALCIUM 9.2 04/26/2021   ANIONGAP 11 11/28/2020   GFR 64.78 04/26/2021   No results found for: HGBA1C    Assessment & Plan:   Problem List Items Addressed This Visit       Respiratory   Bronchitis with bronchospasm    Suspected pneumonia however awaiting results of the chest x-ray treating for bronchitis for now Augmentin sent to pharmacy as well as steroid taper as well as albuterol for shortness of breath.        Other   Rhonchi    Chest x-ray stat today suspected pneumonia Starting patient on Augmentin and sending albuterol inhaler and starting on prednisone.  If positive for pneumonia will hold doxycycline as well recommending probiotic and eating with meals.  If patient is to experience any sudden worsening of breath to immediately go to the emergency room      Relevant Medications   amoxicillin-clavulanate (AUGMENTIN) 875-125 MG tablet   albuterol (VENTOLIN HFA) 108 (90 Base) MCG/ACT inhaler   predniSONE (DELTASONE) 20 MG tablet   Other Relevant Orders   DG Chest 2 View   Wheezing - Primary    Albuterol inhaler sent to pharmacy.Stat chest x-ray pending results       Relevant Medications   amoxicillin-clavulanate (AUGMENTIN) 875-125 MG tablet   albuterol (VENTOLIN HFA) 108 (90 Base) MCG/ACT inhaler   predniSONE (DELTASONE) 20 MG tablet   Other Relevant Orders   DG Chest 2 View    Meds ordered this encounter  Medications   amoxicillin-clavulanate (AUGMENTIN) 875-125 MG tablet    Sig: Take 1 tablet by mouth 2 (two) times daily.    Dispense:  20 tablet    Refill:  0    Order Specific Question:   Supervising Provider    Answer:   BEDSOLE, AMY E [[id number]]   albuterol (VENTOLIN HFA) 108 (90 Base) MCG/ACT inhaler    Sig: Inhale 2 puffs into the lungs every 6 (six) hours as needed for wheezing or shortness of breath.    Dispense:  8 g    Refill:  0    Order Specific Question:   Supervising Provider    Answer:   BEDSOLE, AMY E [[id number]]   predniSONE  (DELTASONE) 20 MG tablet    Sig: Take two tablets daily for 3 days followed by one tablet daily for 4 days    Dispense:  10 tablet    Refill:  0    Order Specific Question:   Supervising Provider    Answer:   Diona Browner, AMY E [[id number]]    Follow-up: Return in about 1 week (around 01/30/2022) for with pcp .    Eugenia Pancoast, FNP

## 2022-01-23 NOTE — Assessment & Plan Note (Signed)
Suspected pneumonia however awaiting results of the chest x-ray treating for bronchitis for now Augmentin sent to pharmacy as well as steroid taper as well as albuterol for shortness of breath. ?

## 2022-01-23 NOTE — Assessment & Plan Note (Signed)
Chest x-ray stat today suspected pneumonia ?Starting patient on Augmentin and sending albuterol inhaler and starting on prednisone.  If positive for pneumonia will hold doxycycline as well recommending probiotic and eating with meals.  If patient is to experience any sudden worsening of breath to immediately go to the emergency room ?

## 2022-01-23 NOTE — Assessment & Plan Note (Addendum)
Albuterol inhaler sent to pharmacy.Stat chest x-ray pending results ? ?

## 2022-01-23 NOTE — Patient Instructions (Signed)
Suspected pneumonia chest xray today  ?Sending Augmentin and steroid to pharmacy to start, if positive for pneumonia will add doxycycline (another antibiotic)  ?Recommend taking with food and also recommend probiotic.  ? ?Follow up one week with PCP.  ? ?It was a pleasure seeing you today! Please do not hesitate to reach out with any questions and or concerns. ? ?Regards,  ? ?Charie Pinkus ?FNP-C ? ? ?

## 2022-02-05 ENCOUNTER — Ambulatory Visit (INDEPENDENT_AMBULATORY_CARE_PROVIDER_SITE_OTHER): Payer: PPO | Admitting: Internal Medicine

## 2022-02-05 ENCOUNTER — Other Ambulatory Visit: Payer: Self-pay

## 2022-02-05 ENCOUNTER — Encounter: Payer: Self-pay | Admitting: Internal Medicine

## 2022-02-05 DIAGNOSIS — J209 Acute bronchitis, unspecified: Secondary | ICD-10-CM | POA: Diagnosis not present

## 2022-02-05 NOTE — Assessment & Plan Note (Signed)
Did have symptoms of reactive airways with wheezing and SOB that improved with prednisone and brief albuterol ?PFTs consistent with restrictive disease--not really obstructive---but symptoms suggest otherwise ? ?Discussed with him ?Should start albuterol with any chest symptoms and early evaluation to consider prednisone if ongoing SOB/wheezing, etc ?

## 2022-02-05 NOTE — Progress Notes (Signed)
? ?Subjective:  ? ? Patient ID: Russell Garrett, male    DOB: [birth year], 71 y.o.   MRN: [id number] ? ?HPI ?Here for follow up of respiratory infection ? ?Had been having cold symptoms---"chest cold" for 4 weeks ?Whole family was sick--thinks they all got it at MIL's funeral ?Constant cough---productive of green phlegm ?No runny nose ?Has had SOB--even going back a year since COVID ?Had wheezing ? ?CXR was okay ?Feels much better after the antibiotic and prednisone ?Used albuterol inhaler at first---then didn't need it ? ?Current Outpatient Medications on File Prior to Visit  ?Medication Sig Dispense Refill  ? albuterol (VENTOLIN HFA) 108 (90 Base) MCG/ACT inhaler Inhale 2 puffs into the lungs every 6 (six) hours as needed for wheezing or shortness of breath. 8 g 0  ? Ascorbic Acid (VITAMIN C) 1000 MG tablet Take 1,000 mg by mouth daily.    ? atorvastatin (LIPITOR) 40 MG tablet Take 1 tablet (40 mg total) by mouth daily. 90 tablet 3  ? ELIQUIS 5 MG TABS tablet TAKE ONE TABLET BY MOUTH TWICE DAILY 180 tablet 3  ? hydrochlorothiazide (HYDRODIURIL) 12.5 MG tablet Take 1 tablet (12.5 mg total) by mouth daily. 90 tablet 3  ? losartan (COZAAR) 50 MG tablet Take 1 tablet (50 mg total) by mouth daily. 90 tablet 3  ? VITAMIN D PO Take by mouth.    ? [DISCONTINUED] lisinopril-hydrochlorothiazide (PRINZIDE,ZESTORETIC) 10-12.5 MG per tablet Take 1 tablet by mouth daily. 30 tablet 11  ? ?No current facility-administered medications on file prior to visit.  ? ? ?No Known Allergies ? ?Past Medical History:  ?Diagnosis Date  ? Arthritis   ? r hip  ? Birth defect   ? 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 Diverticulitis   ? pt denies  ? Hyperlipidemia   ? Hypertension   ? Mild ascending aorta dilatation (HCC)   ? 4.8cm in diamter per CT chest, see imaging in epic dated 02-25-18  ? OSA (obstructive sleep apnea)   ? Pulmonary air embolism ([place]) 02/2018  ? lifetime eliquis since then   ? Sleep apnea   ? CPAP  ? ? ?Past Surgical  History:  ?Procedure Laterality Date  ? BACK SURGERY    ? COLONOSCOPY    ? THORACENTESIS  08/2006  ? left pleural effusion  ? 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 Procedure: RIGHT TOTAL HIP ARTHROPLASTY ANTERIOR APPROACH;  Surgeon: Gaynelle Arabian, MD;  Location: [facility];  Service: Orthopedics;  Laterality: Right;  145mn  ? ? ?Family History  ?Problem Relation Age of Onset  ? Hypertension Mother   ? Heart disease Mother   ? Kidney disease Mother   ? Heart disease Father   ? Stroke Father   ? Prostate cancer Father   ? Kidney disease Father   ? Heart disease Brother   ?     cardioversion for arrhythmia  ? Diabetes Neg Hx   ? Colon cancer Neg Hx   ? ? ?Social History  ? ?Socioeconomic History  ? Marital status: Married  ?  Spouse name: Not on file  ? Number of children: 2  ? Years of education: Not on file  ? Highest education level: Not on file  ?Occupational History  ? Occupation: retired- part oElectronics engineer ?Tobacco Use  ? Smoking status: Former  ?  Packs/day: 1.00  ?  Years: 35.00  ?  Pack years: 35.00  ?  Types: Cigarettes  ?  Quit date: 12/16/2015  ?  Years since quitting: 6.1  ?  Passive exposure: Never  ? Smokeless tobacco: Never  ?Vaping Use  ? Vaping Use: Never used  ?Substance and Sexual Activity  ? Alcohol use: Yes  ?  Alcohol/week: 0.0 standard drinks  ?  Comment: occ  ? Drug use: No  ? Sexual activity: Not on file  ?Other Topics Concern  ? Not on file  ?Social History Narrative  ? Widowed  ? Remarried June 2014  ?   ? Has living will  ? Wife is health care POA--- alternate is daughter, Magda Paganini  ? Would accept resuscitation   ? Probably no tube feeds if cognitively unaware.  ? ?Social Determinants of Health  ? ?Financial Resource Strain: Not on file  ?Food Insecurity: Not on file  ?Transportation Needs: Not on file  ?Physical Activity: Not on file  ?Stress: Not on file  ?Social Connections: Not on file  ?Intimate Partner Violence: Not on file  ? ?Review of Systems ?Sleeps well ?No recent  cough---even at night ?Walking some---no cough then either ? ?   ?Objective:  ? Physical Exam ?Constitutional:   ?   Appearance: Normal appearance.  ?Cardiovascular:  ?   Rate and Rhythm: Normal rate and regular rhythm.  ?   Heart sounds: No murmur heard. ?  No gallop.  ?Pulmonary:  ?   Effort: Pulmonary effort is normal.  ?   Breath sounds: Normal breath sounds. No wheezing or rales.  ?Musculoskeletal:  ?   Cervical back: Neck supple.  ?Lymphadenopathy:  ?   Cervical: No cervical adenopathy.  ?Neurological:  ?   Mental Status: He is alert.  ?  ? ? ? ? ?   ?Assessment & Plan:  ? ?

## 2022-02-27 ENCOUNTER — Other Ambulatory Visit: Payer: Self-pay | Admitting: Thoracic Surgery (Cardiothoracic Vascular Surgery)

## 2022-02-27 ENCOUNTER — Other Ambulatory Visit: Payer: Self-pay | Admitting: Internal Medicine

## 2022-02-27 DIAGNOSIS — I7121 Aneurysm of the ascending aorta, without rupture: Secondary | ICD-10-CM

## 2022-04-08 ENCOUNTER — Other Ambulatory Visit: Payer: Self-pay | Admitting: Thoracic Surgery (Cardiothoracic Vascular Surgery)

## 2022-04-08 DIAGNOSIS — I7121 Aneurysm of the ascending aorta, without rupture: Secondary | ICD-10-CM

## 2022-04-22 ENCOUNTER — Ambulatory Visit: Payer: PPO | Admitting: Thoracic Surgery (Cardiothoracic Vascular Surgery)

## 2022-04-22 ENCOUNTER — Encounter: Payer: Self-pay | Admitting: Thoracic Surgery (Cardiothoracic Vascular Surgery)

## 2022-04-22 ENCOUNTER — Ambulatory Visit
Admission: RE | Admit: 2022-04-22 | Discharge: 2022-04-22 | Disposition: A | Discharge status: Home / Self Care | Payer: PPO | Source: Ambulatory Visit | Attending: Thoracic Surgery (Cardiothoracic Vascular Surgery) | Admitting: Thoracic Surgery (Cardiothoracic Vascular Surgery)

## 2022-04-22 ENCOUNTER — Other Ambulatory Visit: Payer: PPO

## 2022-04-22 VITALS — BP 155/90 | HR 74 | Resp 18 | Ht 75.0 in | Wt 305.6 lb

## 2022-04-22 DIAGNOSIS — I7121 Aneurysm of the ascending aorta, without rupture: Secondary | ICD-10-CM | POA: Diagnosis not present

## 2022-04-22 MED ORDER — IOPAMIDOL (ISOVUE-370) INJECTION 76%
75.0000 mL | Freq: Once | INTRAVENOUS | Status: AC | PRN
  Administered 2022-04-22: 75 mL via INTRAVENOUS

## 2022-04-22 MED ORDER — METOPROLOL SUCCINATE ER 25 MG PO TB24: 25.0000 mg | ORAL_TABLET | Freq: Every day | ORAL | 5 refills | Status: DC

## 2022-04-22 NOTE — Progress Notes (Signed)
[address]       [place] [address]             [phone]     HPI: [name] returns for follow-up of his ascending aneurysm  Russell Garrett is a 71 year old man with a history of an ascending aneurysm, thoracic aortic atherosclerosis, obesity, hypertension, hyperlipidemia, arthritis, pulmonary embolus, and restrictive pulmonary disease.  He is a former smoker with a 35-pack-year history prior to quitting in 2017.  He was noted to have an ascending aneurysm about a year ago but had been present on previous scans dating back several years earlier when he had his pulmonary embolus.  Most recent scan showed 4.7 cm ascending aneurysm.  In the interim since his last visit he has been feeling well.  He is not walking as much as he was back during [place].  He was doing about 6 miles a day back then had lost a lot of weight.  Unfortunately he has gained that weight back.  He is currently walking about 3 miles a day.  Not checking his blood pressure on a regular basis.  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 8 g 0   Ascorbic Acid (VITAMIN C) 1000 MG tablet Take 1,000 mg by mouth daily.     atorvastatin (LIPITOR) 40 MG tablet Take 1 tablet (40 mg total) by mouth daily. 90 tablet 3   ELIQUIS 5 MG TABS tablet TAKE ONE TABLET BY MOUTH TWICE DAILY 180 tablet 3   hydrochlorothiazide  (HYDRODIURIL) 12.5 MG tablet Take 1 tablet (12.5 mg total) by mouth daily. 90 tablet 3   losartan (COZAAR) 50 MG tablet Take 1 tablet (50 mg total) by mouth daily. 90 tablet 3   metoprolol succinate (TOPROL XL) 25 MG 24 hr tablet Take 1 tablet (25 mg total) by mouth daily. 30 tablet 5   VITAMIN D PO Take by mouth.     No current facility-administered medications for this visit.    Physical Exam BP (!) 155/90 (BP Location: Left Arm, Patient Position: Sitting, Cuff Size: Large)   Pulse 74   Resp 18   Ht '6\' 3"'$  (1.905 m)   Wt (!) 305 lb 9.6 oz (138.6 kg)   SpO2 95% Comment: RA  BMI 38.20 kg/m  Obese 71 year old man in no acute distress Alert and oriented x3 with no focal deficits Lungs clear with no rales or wheezing Cardiac regular rate and rhythm no murmur Trace edema lower extremities No carotid bruits  Diagnostic Tests: CT ANGIOGRAPHY CHEST WITH CONTRAST   TECHNIQUE: Multidetector CT imaging of the chest was performed using the standard protocol during bolus administration of intravenous contrast. Multiplanar CT image reconstructions and MIPs were obtained to evaluate the vascular  anatomy.   RADIATION DOSE REDUCTION: This exam was performed according to the departmental dose-optimization program which includes automated exposure control, adjustment of the mA and/or kV according to patient size and/or use of iterative reconstruction technique.   Creatinine was obtained on site at [facility] at [address]   Results: Creatinine 1.2 mg/dL.   CONTRAST:  31m ISOVUE-370 IOPAMIDOL (ISOVUE-370) INJECTION 76%   COMPARISON:  Multiple priors including most recent CT May 16, 2021   FINDINGS: Cardiovascular: Ascending thoracic aortic aneurysm measures 4.7 cm, unchanged. The aortic arch measures 2.9 cm in maximum dimension. Descending thoracic aorta at the level of the pulmonary outflow tract measures 2.8 cm. Aortic atherosclerosis. No central  pulmonary embolus on this nondedicated study. Normal size heart. No significant pericardial effusion/thickening. Coronary artery calcifications.   Mediastinum/Nodes: No enlarged mediastinal, hilar, or axillary lymph nodes. Thyroid gland, trachea, and esophagus demonstrate no significant findings.   Lungs/Pleura: Mild diffuse bronchial wall thickening. Similar scarring in the right lung base. No pleural effusion. No pneumothorax.   Upper Abdomen: No acute abnormality.   Musculoskeletal: Multilevel degenerative changes spine. No acute osseous abnormality.   Review of the MIP images confirms the above findings.   IMPRESSION: 1. Unchanged 4.7 cm ascending thoracic aortic aneurysm. Ascending thoracic aortic aneurysm. Recommend semi-annual imaging followup by CTA or MRA and referral to cardiothoracic surgery if not already obtained. This recommendation follows 2010 ACCF/AHA/AATS/ACR/ASA/SCA/SCAI/SIR/STS/SVM Guidelines for the Diagnosis and Management of Patients With Thoracic Aortic Disease. Circulation. 2010; 121:: G536-I680 Aortic aneurysm NOS (ICD10-I71.9) 2.  Aortic Atherosclerosis (ICD10-I70.0).     Electronically Signed   By: JDahlia BailiffM.D.   On: 04/22/2022 12:51 I personally reviewed the CT images.  There is a 4.7 cm ascending aneurysm.  Normal root and sinotubular junction.  Thoracic aortic atherosclerosis.  Impression: Russell Garrett a 71year old man with a history of an ascending aneurysm, thoracic aortic atherosclerosis, obesity, hypertension, hyperlipidemia, arthritis, pulmonary embolus, and restrictive pulmonary disease.  He is a former smoker with a 35-pack-year history prior to quitting in 2017.  Ascending thoracic aortic aneurysm/thoracic aortic atherosclerosis-aneurysm stable at 4.7 cm.  Reviewed indications for surgery.  No indication for surgery for Mr. Rotunno at this time.  Emphasized importance of blood pressure control.  He is on a statin for the  atherosclerosis.  Hypertension-blood pressure elevated at 150/90.  He is only on low-dose Cozaar and hydrochlorothiazide.  I recommended we start a low-dose beta-blocker with Toprol-XL 25 mg p.o. daily.  Also recommended that he check his blood pressure at home on a regular basis.  Goal is systolic less than 1321and ideally less than 130.  History of tobacco abuse-quit smoking about 6 years ago.  Does meet criteria for lung cancer screening, but will be having CTs to follow his aneurysm so no need for an additional scan.  Plan: Toprol-XL 25 mg daily, 30 tablets, 5 refills Encouraged exercise and weight loss. Return in 6 months with CT angio of chest  SMelrose Nakayama MD [facility] ([phone]

## 2022-04-30 ENCOUNTER — Encounter: Payer: PPO | Admitting: Internal Medicine

## 2022-05-06 DIAGNOSIS — H43811 Vitreous degeneration, right eye: Secondary | ICD-10-CM | POA: Diagnosis not present

## 2022-05-29 DIAGNOSIS — D2271 Melanocytic nevi of right lower limb, including hip: Secondary | ICD-10-CM | POA: Diagnosis not present

## 2022-05-29 DIAGNOSIS — D2261 Melanocytic nevi of right upper limb, including shoulder: Secondary | ICD-10-CM | POA: Diagnosis not present

## 2022-05-29 DIAGNOSIS — L821 Other seborrheic keratosis: Secondary | ICD-10-CM | POA: Diagnosis not present

## 2022-05-29 DIAGNOSIS — D2262 Melanocytic nevi of left upper limb, including shoulder: Secondary | ICD-10-CM | POA: Diagnosis not present

## 2022-05-29 DIAGNOSIS — D2272 Melanocytic nevi of left lower limb, including hip: Secondary | ICD-10-CM | POA: Diagnosis not present

## 2022-05-29 DIAGNOSIS — D225 Melanocytic nevi of trunk: Secondary | ICD-10-CM | POA: Diagnosis not present

## 2022-06-02 ENCOUNTER — Other Ambulatory Visit: Payer: Self-pay | Admitting: Internal Medicine

## 2022-06-06 DIAGNOSIS — H43811 Vitreous degeneration, right eye: Secondary | ICD-10-CM | POA: Diagnosis not present

## 2022-06-06 DIAGNOSIS — H2513 Age-related nuclear cataract, bilateral: Secondary | ICD-10-CM | POA: Diagnosis not present

## 2022-06-11 ENCOUNTER — Ambulatory Visit (INDEPENDENT_AMBULATORY_CARE_PROVIDER_SITE_OTHER): Payer: PPO | Admitting: Internal Medicine

## 2022-06-11 ENCOUNTER — Encounter: Payer: Self-pay | Admitting: Internal Medicine

## 2022-06-11 VITALS — BP 140/82 | HR 67 | Temp 98.1°F | Ht 73.0 in | Wt 304.0 lb

## 2022-06-11 DIAGNOSIS — Z125 Encounter for screening for malignant neoplasm of prostate: Secondary | ICD-10-CM

## 2022-06-11 DIAGNOSIS — I82409 Acute embolism and thrombosis of unspecified deep veins of unspecified lower extremity: Secondary | ICD-10-CM | POA: Diagnosis not present

## 2022-06-11 DIAGNOSIS — I10 Essential (primary) hypertension: Secondary | ICD-10-CM | POA: Diagnosis not present

## 2022-06-11 DIAGNOSIS — Z0000 Encounter for general adult medical examination without abnormal findings: Secondary | ICD-10-CM | POA: Diagnosis not present

## 2022-06-11 DIAGNOSIS — I712 Thoracic aortic aneurysm, without rupture, unspecified: Secondary | ICD-10-CM | POA: Diagnosis not present

## 2022-06-11 DIAGNOSIS — G4733 Obstructive sleep apnea (adult) (pediatric): Secondary | ICD-10-CM

## 2022-06-11 DIAGNOSIS — I700 Atherosclerosis of aorta: Secondary | ICD-10-CM

## 2022-06-11 LAB — CBC
HCT: 44.8 % (ref 39.0–52.0)
Hemoglobin: 14.9 g/dL (ref 13.0–17.0)
MCHC: 33.4 g/dL (ref 30.0–36.0)
MCV: 91.3 fl (ref 78.0–100.0)
Platelets: 147 10*3/uL — ABNORMAL LOW (ref 150.0–400.0)
RBC: 4.9 Mil/uL (ref 4.22–5.81)
RDW: 13.6 % (ref 11.5–15.5)
WBC: 4.8 10*3/uL (ref 4.0–10.5)

## 2022-06-11 LAB — COMPREHENSIVE METABOLIC PANEL
ALT: 26 U/L (ref 0–53)
AST: 24 U/L (ref 0–37)
Albumin: 4.6 g/dL (ref 3.5–5.2)
Alkaline Phosphatase: 72 U/L (ref 39–117)
BUN: 25 mg/dL — ABNORMAL HIGH (ref 6–23)
CO2: 30 mEq/L (ref 19–32)
Calcium: 9.5 mg/dL (ref 8.4–10.5)
Chloride: 103 mEq/L (ref 96–112)
Creatinine, Ser: 1.06 mg/dL (ref 0.40–1.50)
GFR: 70.87 mL/min (ref >60.00)
Glucose, Bld: 88 mg/dL (ref 70–99)
Potassium: 4 mEq/L (ref 3.5–5.1)
Sodium: 141 mEq/L (ref 135–145)
Total Bilirubin: 1.1 mg/dL (ref 0.2–1.2)
Total Protein: 7.5 g/dL (ref 6.0–8.3)

## 2022-06-11 LAB — LIPID PANEL
Cholesterol: 176 mg/dL (ref 0–200)
HDL: 54.3 mg/dL (ref >39.00)
LDL Cholesterol: 101 mg/dL — ABNORMAL HIGH (ref 0–99)
NonHDL: 121.7
Total CHOL/HDL Ratio: 3
Triglycerides: 104 mg/dL (ref 0.0–149.0)
VLDL: 20.8 mg/dL (ref 0.0–40.0)

## 2022-06-11 LAB — PSA, MEDICARE: PSA: 1.29 ng/ml (ref 0.10–4.00)

## 2022-06-11 NOTE — Progress Notes (Signed)
Subjective:    Patient ID: Russell Garrett, male    DOB: April 28, 1951, 71 y.o.   MRN: [id number]  HPI Here for Medicare wellness visit and follow up of chronic health conditions Reviewed advanced directives Reviewed other doctors---Dr Hendrickson--CT surgery, [facility], Dr Olivia Mackie, Dr Bonnielee Haff, Dr Kim--derm No hospitalizations or surgery Recent vision problems---flashes (but no detachment) Hearing is okay Occasional beer No tobacco Not exercising No falls No depression or anhedonia Independent with instrumental ADLs No significant memory issues  Pulse running in low 60's Checking BP-- usually 140/80's Now on metoprolol Still has follow up with thoracic surgery every 6 months--but aneurysm stable Known aortic atherosclerosis  Will nap if just sitting down in afternoon Sleeps with CPAP--does well with this  BMI now over 40 Gets DOE No chest pain No dizziness or syncope Stable edema--gone in the AM  Current Outpatient Medications on File Prior to Visit  Medication Sig Dispense Refill   albuterol (VENTOLIN HFA) 108 (90 Base) MCG/ACT inhaler Inhale 2 puffs into the lungs every 6 (six) hours as needed for wheezing or shortness of breath. 8 g 0   Ascorbic Acid (VITAMIN C) 1000 MG tablet Take 1,000 mg by mouth daily.     atorvastatin (LIPITOR) 40 MG tablet Take 1 tablet (40 mg total) by mouth daily. 90 tablet 3   ELIQUIS 5 MG TABS tablet TAKE ONE TABLET BY MOUTH TWICE DAILY 180 tablet 3   hydrochlorothiazide (HYDRODIURIL) 12.5 MG tablet TAKE 1 TABLET BY MOUTH ONCE DAILY 90 tablet 3   losartan (COZAAR) 50 MG tablet TAKE 1 TABLET BY MOUTH ONCE DAILY 90 tablet 3   metoprolol succinate (TOPROL XL) 25 MG 24 hr tablet Take 1 tablet (25 mg total) by mouth daily. 30 tablet 5   VITAMIN D PO Take by mouth.     [DISCONTINUED] lisinopril-hydrochlorothiazide (PRINZIDE,ZESTORETIC) 10-12.5 MG per tablet Take 1 tablet by mouth daily. 30 tablet 11   No current  facility-administered medications on file prior to visit.    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   Procedure: RIGHT TOTAL HIP ARTHROPLASTY ANTERIOR APPROACH;  Surgeon: Gaynelle Arabian, MD;  Location: [facility];  Service: Orthopedics;  Laterality: Right;  173mn    Family History  Problem Relation Age of Onset   Hypertension Mother    Heart disease Mother    Kidney disease Mother    Heart disease Father    Stroke Father    Prostate cancer Father    Kidney disease Father    Heart disease Brother        cardioversion for arrhythmia   Diabetes Neg Hx    Colon cancer Neg Hx     Social History   Socioeconomic History   Marital status:  Married    Spouse name: Not on file   Number of children: 2   Years of education: Not on file   Highest education level: Not on file  Occupational History   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  Tobacco Use   Smoking status: Former    Packs/day: 1.00    Years: 35.00    Total pack years: 35.00    Types: Cigarettes    Quit date: 12/16/2015    Years since quitting: 6.4    Passive exposure: Never   Smokeless tobacco: Never  Vaping Use   Vaping Use: Never used  Substance and Sexual Activity   Alcohol use: Yes    Alcohol/week: 0.0 standard drinks of alcohol    Comment: occ   Drug use: No   Sexual activity: Not on file  Other Topics Concern   Not on  file  Social History Narrative   Widowed   Remarried June 2014      Has living will   Wife is health care POA--- alternate is daughter, Magda Paganini   Would accept resuscitation    Probably no tube feeds if cognitively unaware.   Social Determinants of Health   Financial Resource Strain: Not on file  Food Insecurity: Not on file  Transportation Needs: Not on file  Physical Activity: Not on file  Stress: Not on file  Social Connections: Not on file  Intimate Partner Violence: Not on file   Review of Systems Appetite is okay Weight up slightly Wears seat belt Teeth okay---regular with dentist No suspicious skin lesions now No heartburn or dysphagia Bowels move fine--no blood Some back stiffness---new chiropractor (not satisfied) Voids okay--reasonable stream. Only occ nocturia    Objective:   Physical Exam Constitutional:      Appearance: Normal appearance. He is obese.  HENT:     Mouth/Throat:     Comments: No lesions Eyes:     Conjunctiva/sclera: Conjunctivae normal.     Pupils: Pupils are equal, round, and reactive to light.  Cardiovascular:     Rate and Rhythm: Normal rate and regular rhythm.     Pulses: Normal pulses.     Heart sounds: No murmur heard.    No gallop.  Pulmonary:     Effort: Pulmonary effort is normal.     Breath sounds: Normal breath sounds.  Abdominal:     Palpations: Abdomen is soft.     Tenderness: There is no abdominal tenderness.  Musculoskeletal:     Cervical back: Neck supple.     Right lower leg: No edema.     Left lower leg: No edema.  Lymphadenopathy:     Cervical: No cervical adenopathy.  Skin:    Findings: No lesion or rash.  Neurological:     General: No focal deficit present.     Mental Status: He is alert and oriented to person, place, and time.     Comments: Mini-cog normal  Psychiatric:        Mood and Affect: Mood normal.        Behavior: Behavior normal.            Assessment & Plan:

## 2022-06-11 NOTE — Assessment & Plan Note (Signed)
Also had unprovoked PE Will continue the eliquis 5 bid

## 2022-06-11 NOTE — Assessment & Plan Note (Signed)
Now on 6 month follow up On atorvastatin 40

## 2022-06-11 NOTE — Assessment & Plan Note (Signed)
I have personally reviewed the Medicare Annual Wellness questionnaire and have noted 1. The patient's medical and social history 2. Their use of alcohol, tobacco or illicit drugs 3. Their current medications and supplements 4. The patient's functional ability including ADL's, fall risks, home safety risks and hearing or visual             impairment. 5. Diet and physical activities 6. Evidence for depression or mood disorders  The patients weight, height, BMI and visual acuity have been recorded in the chart I have made referrals, counseling and provided education to the patient based review of the above and I have provided the pt with a written personalized care plan for preventive services.  I have provided you with a copy of your personalized plan for preventive services. Please take the time to review along with your updated medication list.  Last screening colon 2028 Will check last PSA today Really needs to work on fitness and weight loss Updated COVID and flu vaccines this fall

## 2022-06-11 NOTE — Assessment & Plan Note (Signed)
Uses CPAP every night

## 2022-06-11 NOTE — Assessment & Plan Note (Signed)
On imaging Is on the statin and eliquis

## 2022-06-11 NOTE — Progress Notes (Signed)
Hearing Screening - Comments:: Passed whisper test Vision Screening - Comments:: July 2023  

## 2022-06-11 NOTE — Assessment & Plan Note (Signed)
Needs to work on eating and exercise DASH info given

## 2022-06-11 NOTE — Assessment & Plan Note (Signed)
BP Readings from Last 3 Encounters:  06/11/22 140/82  04/22/22 (!) 155/90  02/05/22 122/74   Acceptable control with losartan 50/HCTZ 12.5 Should improve if loses weight Due for labs

## 2022-06-25 ENCOUNTER — Other Ambulatory Visit: Payer: Self-pay | Admitting: Internal Medicine

## 2022-07-01 ENCOUNTER — Other Ambulatory Visit: Payer: Self-pay

## 2022-07-01 ENCOUNTER — Telehealth: Payer: Self-pay | Admitting: Internal Medicine

## 2022-07-01 NOTE — Telephone Encounter (Signed)
Patient called in and wanted to give his Shingles shot dates. Thank You!  Shingles Shot First- 05/02/2021 Second- 07/22/2021

## 2022-07-01 NOTE — Patient Outreach (Signed)
  Care Coordination   Initial Visit Note   07/01/2022 Name: Russell Garrett MRN: [id number] DOB: 1951-01-25  Russell Garrett is a 71 y.o. year old male who sees Venia Carbon, MD for primary care. I spoke with  Russell Garrett by phone today  What matters to the patients health and wellness today?  Patient reports he has no needs.  He  reports having  physical with provider 2 weeks ago.  Contact number provided by [facility] and patient advised to call if services needed in the future.     Goals Addressed             This Visit's Progress    COMPLETED: Care coordination activities. No follow up required                SDOH assessments and interventions completed:  No     Care Coordination Interventions Activated:  No  Care Coordination Interventions:  No, not indicated   Follow up plan: No further intervention required.   Encounter Outcome:  Pt. Visit Completed   Quinn Plowman RN,BSN,CCM [place] Coordinator [phone]

## 2022-07-01 NOTE — Telephone Encounter (Signed)
Thank you. I have updated his record. 

## 2022-07-16 ENCOUNTER — Other Ambulatory Visit: Payer: Self-pay | Admitting: Thoracic Surgery (Cardiothoracic Vascular Surgery)

## 2022-07-25 HISTORY — PX: REFRACTIVE SURGERY: SHX103

## 2022-08-11 DIAGNOSIS — H25013 Cortical age-related cataract, bilateral: Secondary | ICD-10-CM | POA: Diagnosis not present

## 2022-08-11 DIAGNOSIS — H43813 Vitreous degeneration, bilateral: Secondary | ICD-10-CM | POA: Diagnosis not present

## 2022-08-11 DIAGNOSIS — H25813 Combined forms of age-related cataract, bilateral: Secondary | ICD-10-CM | POA: Diagnosis not present

## 2022-08-11 DIAGNOSIS — H43391 Other vitreous opacities, right eye: Secondary | ICD-10-CM | POA: Diagnosis not present

## 2022-08-11 DIAGNOSIS — H33321 Round hole, right eye: Secondary | ICD-10-CM | POA: Diagnosis not present

## 2022-08-11 DIAGNOSIS — H2513 Age-related nuclear cataract, bilateral: Secondary | ICD-10-CM | POA: Diagnosis not present

## 2022-08-11 DIAGNOSIS — H35371 Puckering of macula, right eye: Secondary | ICD-10-CM | POA: Diagnosis not present

## 2022-08-11 DIAGNOSIS — H52203 Unspecified astigmatism, bilateral: Secondary | ICD-10-CM | POA: Diagnosis not present

## 2022-08-11 DIAGNOSIS — H524 Presbyopia: Secondary | ICD-10-CM | POA: Diagnosis not present

## 2022-08-11 DIAGNOSIS — H43811 Vitreous degeneration, right eye: Secondary | ICD-10-CM | POA: Diagnosis not present

## 2022-08-11 DIAGNOSIS — H33301 Unspecified retinal break, right eye: Secondary | ICD-10-CM | POA: Diagnosis not present

## 2022-08-18 DIAGNOSIS — M25562 Pain in left knee: Secondary | ICD-10-CM | POA: Diagnosis not present

## 2022-08-20 ENCOUNTER — Other Ambulatory Visit: Payer: Self-pay | Admitting: *Deleted

## 2022-08-20 DIAGNOSIS — I7121 Aneurysm of the ascending aorta, without rupture: Secondary | ICD-10-CM

## 2022-08-27 DIAGNOSIS — H43811 Vitreous degeneration, right eye: Secondary | ICD-10-CM | POA: Diagnosis not present

## 2022-08-27 DIAGNOSIS — H35432 Paving stone degeneration of retina, left eye: Secondary | ICD-10-CM | POA: Diagnosis not present

## 2022-08-27 DIAGNOSIS — H31091 Other chorioretinal scars, right eye: Secondary | ICD-10-CM | POA: Diagnosis not present

## 2022-08-27 DIAGNOSIS — H33321 Round hole, right eye: Secondary | ICD-10-CM | POA: Diagnosis not present

## 2022-08-27 DIAGNOSIS — H43391 Other vitreous opacities, right eye: Secondary | ICD-10-CM | POA: Diagnosis not present

## 2022-08-27 DIAGNOSIS — H25813 Combined forms of age-related cataract, bilateral: Secondary | ICD-10-CM | POA: Diagnosis not present

## 2022-10-03 ENCOUNTER — Ambulatory Visit (INDEPENDENT_AMBULATORY_CARE_PROVIDER_SITE_OTHER): Payer: PPO | Admitting: Internal Medicine

## 2022-10-03 ENCOUNTER — Encounter: Payer: Self-pay | Admitting: Internal Medicine

## 2022-10-03 VITALS — BP 126/70 | HR 79 | Temp 97.6°F | Ht 74.0 in | Wt 310.4 lb

## 2022-10-03 DIAGNOSIS — G4733 Obstructive sleep apnea (adult) (pediatric): Secondary | ICD-10-CM

## 2022-10-03 DIAGNOSIS — Z23 Encounter for immunization: Secondary | ICD-10-CM

## 2022-10-03 NOTE — Progress Notes (Signed)
Russell Garrett    [id number]    Oct 21, 1951  Primary Care Physician:Letvak, Theophilus Kinds, MD Date of Appointment: 10/03/2022 Established Patient Visit  Chief complaint:   Chief Complaint  Patient presents with   Follow-up    Yearly OV.  Pt doing well.  Sleeping well. No sx noted today.  Would like flu vaccine today     HPI: Russell Garrett is a 71 y.o. man with OSA on CPAP.   Interval Updates: Here for one year follow up for CPAP. Reports good efficacy from CPAP therapy. No excessive daytime sleepiness or headaches.   CPAP download reviewed which shows 100% adherence over 8 hours, minimal leak, auto cpap 10-20 with median pressure 10.4 cm H20, AHI 0.3.   Interval history had laser surgery in his eyes. Having some knee pain.    Social Hx: Retired from working in a Leisure centre manager. Lives in [place]. Lives at home with wife, pet dog.  30+ pack year smoking history. Quit in 2017.  I have reviewed the patient's family social and past medical history and updated as appropriate.   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   Procedure: RIGHT TOTAL HIP ARTHROPLASTY ANTERIOR APPROACH;  Surgeon: Gaynelle Arabian, MD;  Location: [facility];  Service: Orthopedics;  Laterality: Right;  142mn    Family History  Problem Relation Age of Onset   Hypertension Mother    Heart disease Mother    Kidney disease Mother    Heart disease  Father    Stroke Father    Prostate cancer Father    Kidney disease Father    Heart disease Brother        cardioversion for arrhythmia   Diabetes Neg Hx    Colon cancer Neg Hx     Social History   Occupational History   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 Tobacco Use   Smoking status: Former    Packs/day: 1.00    Years: 35.00    Total pack years: 35.00    Types: Cigarettes    Quit date: 12/16/2015    Years since quitting: 6.8    Passive exposure: Never   Smokeless tobacco: Never  Vaping Use   Vaping Use: Never used  Substance and Sexual Activity   Alcohol use: Yes    Alcohol/week: 0.0 standard drinks of alcohol    Comment: occ   Drug use: No   Sexual activity: Not on file     Physical Exam: Blood pressure 126/70, pulse 79, temperature 97.6 F (36.4 C), temperature source Oral, height '6\' 2"'$  (1.88 m), weight (!) 310 lb 6.4 oz (140.8 kg), SpO2 96 %.  Gen:      No acute distress, obese ENT:  no nasal polyps, mucus membranes moist Lungs:    No increased respiratory effort, symmetric chest wall excursion, clear to auscultation bilaterally, no wheezes or crackles CV:         Regular rate and rhythm; no murmurs, rubs, or gallops.  No pedal edema   Data Reviewed: Imaging: I have personally reviewed the CT Chest June 2022 which shows stable RLL  post infectious scarring and a ascending aortic aneurysm. No emphysema. No nodules.   PFTs:      Latest Ref Rng & Units 09/16/2021    3:46 PM  PFT Results  FVC-Pre L 2.81   FVC-Predicted Pre % 53   FVC-Post L 2.78   FVC-Predicted Post % 52   Pre FEV1/FVC % % 81   Post FEV1/FCV % % 80   FEV1-Pre L 2.28   FEV1-Predicted Pre % 58   FEV1-Post L 2.23   DLCO uncorrected ml/min/mmHg 24.33   DLCO UNC% % 80   DLCO corrected ml/min/mmHg 24.33   DLCO COR %Predicted % 80   DLVA Predicted % 141   TLC L 5.28   TLC % Predicted % 65   RV % Predicted % 92    I have personally reviewed the patient's PFTs and  moderately severe restriction to ventilation secondary to body habitus.   Labs: Lab Results  Component Value Date   WBC 4.8 06/11/2022   HGB 14.9 06/11/2022   HCT 44.8 06/11/2022   MCV 91.3 06/11/2022   PLT 147.0 (L) 06/11/2022   Lab Results  Component Value Date   NA 141 06/11/2022   K 4.0 06/11/2022   CL 103 06/11/2022   CO2 30 06/11/2022    Immunization status: Immunization History  Administered Date(s) Administered   Fluad Quad(high Dose 65+) 07/27/2019, 08/16/2020, 08/28/2021   H1N1 02/09/2009   Influenza Split 08/06/2011, 08/18/2012, 10/09/2015   Influenza Whole 08/16/2010   Influenza,inj,Quad PF,6+ Mos 09/20/2013, 10/04/2016, 09/25/2018   Influenza,inj,quad, With Preservative 11/23/2017   Influenza-Unspecified 09/24/2016   PFIZER(Purple Top)SARS-COV-2 Vaccination 01/07/2020, 01/31/2020, 11/22/2020   Pneumococcal Conjugate-13 03/17/2017   Pneumococcal Polysaccharide-23 03/19/2018   Pneumococcal-Unspecified 02/22/2013   Td 12/06/1996, 01/25/2008   Tdap 03/19/2018   Tetanus 11/24/2016   Zoster Recombinat (Shingrix) 05/02/2021, 07/22/2021   Zoster, Live 03/11/2013    Assessment:  OSA on CPAP - well controlled Ascending Aortic Aneurysm History of Tobacco use  Plan/Recommendations: OSA controlled on current settings. Continue CPAP 10-20 cm H20  CPAP looks good! We'll see you in a year or sooner if any issues or concerns.   We will give you your flu shot today. You can get the RSV and covid vaccines at the pharmacy.   Return to Care: Return in about 1 year (around 10/04/2023). For OSA.    Lenice Llamas, MD [facility] [place]

## 2022-10-03 NOTE — Patient Instructions (Signed)
Please schedule follow up scheduled with myself in 1 year.  If my schedule is not open yet, we will contact you with a reminder closer to that time. Please call [phone] if you haven't heard from [place] a month before.   CPAP looks good! We'll see you in a year or sooner if any issues or concerns.   We will give you your flu shot today. You can get the RSV and covid vaccines at the pharmacy.

## 2022-10-10 ENCOUNTER — Encounter: Payer: Self-pay | Admitting: Internal Medicine

## 2022-10-21 ENCOUNTER — Ambulatory Visit: Payer: PPO | Admitting: Thoracic Surgery (Cardiothoracic Vascular Surgery)

## 2022-10-21 ENCOUNTER — Ambulatory Visit
Admission: RE | Admit: 2022-10-21 | Discharge: 2022-10-21 | Disposition: A | Discharge status: Home / Self Care | Payer: PPO | Source: Ambulatory Visit | Attending: Thoracic Surgery (Cardiothoracic Vascular Surgery) | Admitting: Thoracic Surgery (Cardiothoracic Vascular Surgery)

## 2022-10-21 ENCOUNTER — Encounter: Payer: Self-pay | Admitting: Thoracic Surgery (Cardiothoracic Vascular Surgery)

## 2022-10-21 VITALS — BP 173/94 | HR 61 | Resp 18 | Ht 74.0 in | Wt 308.0 lb

## 2022-10-21 DIAGNOSIS — I712 Thoracic aortic aneurysm, without rupture, unspecified: Secondary | ICD-10-CM | POA: Diagnosis not present

## 2022-10-21 DIAGNOSIS — I7121 Aneurysm of the ascending aorta, without rupture: Secondary | ICD-10-CM

## 2022-10-21 MED ORDER — IOPAMIDOL (ISOVUE-370) INJECTION 76%
75.0000 mL | Freq: Once | INTRAVENOUS | Status: AC | PRN
  Administered 2022-10-21: 75 mL via INTRAVENOUS

## 2022-10-21 NOTE — Progress Notes (Signed)
[address]       [place] [address]             [phone]     HPI: Mr. Russell Garrett returns for follow-up of his ascending aneurysm.  Russell Garrett is a 71 year old man with a history of an ascending aneurysm, thoracic aortic atherosclerosis, obesity, hypertension, hyperlipidemia, arthritis, pulmonary embolus, and restrictive pulmonary disease.  He is a former smoker.  35-pack-year history prior to quitting in 2017.  He was noted to have an ascending aneurysm on a CT in 2022.  In retrospect it was present dating back to 2019.  It was also present but smaller on a CT in 2008.  That was a noncontrast scan but it appeared to be about 4.3 cm at that time.  I last saw him in May.  His aneurysm was stable.  He has been walking about 6 miles a day and lost weight but he has had knee pain and has been unable to do much exercise recently.  He does not check his blood pressure on a regular basis at home.  No chest pain, pressure, or tightness.  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 8 g 0   Ascorbic Acid (VITAMIN C) 1000 MG tablet Take 1,000 mg by mouth daily.     atorvastatin (LIPITOR) 40 MG tablet TAKE 1 TABLET BY MOUTH ONCE DAILY 90 tablet 3   ELIQUIS 5 MG TABS tablet TAKE ONE TABLET BY MOUTH TWICE DAILY 180 tablet 3   hydrochlorothiazide (HYDRODIURIL) 12.5 MG tablet TAKE 1 TABLET  BY MOUTH ONCE DAILY 90 tablet 3   losartan (COZAAR) 50 MG tablet TAKE 1 TABLET BY MOUTH ONCE DAILY 90 tablet 3   metoprolol succinate (TOPROL-XL) 25 MG 24 hr tablet TAKE 1 TABLET BY MOUTH DAILY. 30 tablet 5   VITAMIN D PO Take by mouth.     No current facility-administered medications for this visit.    Physical Exam BP (!) 173/94 (BP Location: Right Arm, Patient Position: Sitting)   Pulse 61   Resp 18   Ht '6\' 2"'$  (1.88 m)   Wt (!) 308 lb (139.7 kg)   SpO2 95% Comment: RA  BMI 39.54 kg/m  Obese 71 year old man in no acute distress Alert and oriented x3 with no focal deficits No carotid bruits Lungs clear bilaterally Cardiac regular rate and rhythm  Diagnostic Tests: CT ANGIOGRAPHY CHEST WITH CONTRAST   TECHNIQUE: Multidetector CT imaging of the chest was performed using the standard protocol during bolus administration of intravenous contrast. Multiplanar CT image reconstructions and MIPs were obtained to evaluate the vascular anatomy.   RADIATION DOSE REDUCTION: This exam was performed according to the departmental dose-optimization program which includes automated exposure control, adjustment of  the mA and/or kV according to patient size and/or use of iterative reconstruction technique.   CONTRAST:  49m ISOVUE-370 IOPAMIDOL (ISOVUE-370) INJECTION 76%   COMPARISON:  Chest CTA 04/22/2022 and 05/16/2021.   FINDINGS: Cardiovascular: There is stable dilatation of the ascending aorta which has a maximal diameter of 4.7 cm. The aortic arch and descending aorta are normal in caliber. There is atherosclerosis of the aorta, great vessels and coronary arteries. No evidence of aortic dissection. No evidence of acute pulmonary embolism. The heart size is normal. There is no pericardial effusion.   Mediastinum/Nodes: There are no enlarged mediastinal, hilar or axillary lymph nodes. The thyroid gland, trachea and esophagus demonstrate no significant findings.   Lungs/Pleura:  No pleural effusion or pneumothorax. Stable chronic elevation of the right hemidiaphragm with associated right basilar parenchymal scarring and mild pleural thickening. No airspace disease or suspicious pulmonary nodularity.   Upper abdomen: The visualized upper abdomen appears stable, without significant findings. There are small cysts within the upper pole of the right kidney which require no imaging follow-up.   Musculoskeletal/Chest wall: There is no chest wall mass or suspicious osseous finding. Mild multilevel thoracic spondylosis.   Review of the MIP images confirms the above findings.   IMPRESSION: 1. Stable dilatation of the ascending aorta which has a maximal diameter of 4.7 cm. No evidence of aortic dissection. Consider continued follow-up by CTA as clinically warranted. 2. No acute chest findings. 3. Stable chronic elevation of the right hemidiaphragm with associated right basilar parenchymal scarring and pleural thickening. 4. Coronary and Aortic Atherosclerosis (ICD10-I70.0).     Electronically Signed   By: WRichardean SaleM.D.   On: 10/21/2022 14:34 I personally reviewed the CT images.  There is a 4.7 cm ascending aneurysm unchanged from his previous scan.  Thoracic aortic atherosclerosis and coronary atherosclerosis.  Elevated right hemidiaphragm.  Impression: Russell Garrett a 71year old man with a history of an ascending aneurysm, thoracic aortic atherosclerosis, obesity, hypertension, hyperlipidemia, arthritis, pulmonary embolus, and restrictive pulmonary disease.  He is a former smoker.  35-pack-year history prior to quitting in 2017.  Ascending thoracic aneurysm/thoracic aortic atherosclerosis-aneurysm stable at 4.7 cm.  Importance of blood pressure control was emphasized.  Needs continued semiannual follow-up.  Coronary atherosclerosis-he is on atorvastatin.  Asymptomatic.  Hypertension-blood pressure markedly elevated today at 1549systolic.  He says he  recently was seen for sleep apnea work-up and it was normal at that time.  Recommended he check himself on a regular basis at least twice a week at home.  If consistently above 140 should contact physician for blood pressure medication adjustment.  Tobacco abuse-quit in 2017.  Obesity-emphasized importance of exercise.  He has been unable to exercise due to knee pain.  Discussed potential alternative options.  Plan: Check blood pressure on regular basis at home Return in 6 months with CT angio chest  SMelrose Nakayama MD [facility] ([phone]

## 2022-10-22 DIAGNOSIS — H31091 Other chorioretinal scars, right eye: Secondary | ICD-10-CM | POA: Diagnosis not present

## 2022-10-22 DIAGNOSIS — H43811 Vitreous degeneration, right eye: Secondary | ICD-10-CM | POA: Diagnosis not present

## 2022-10-22 DIAGNOSIS — H25813 Combined forms of age-related cataract, bilateral: Secondary | ICD-10-CM | POA: Diagnosis not present

## 2022-10-22 DIAGNOSIS — H35431 Paving stone degeneration of retina, right eye: Secondary | ICD-10-CM | POA: Diagnosis not present

## 2022-11-24 DIAGNOSIS — S76319A Strain of muscle, fascia and tendon of the posterior muscle group at thigh level, unspecified thigh, initial encounter: Secondary | ICD-10-CM

## 2022-11-24 HISTORY — DX: Strain of muscle, fascia and tendon of the posterior muscle group at thigh level, unspecified thigh, initial encounter: S76.319A

## 2022-12-04 ENCOUNTER — Telehealth: Payer: Self-pay | Admitting: Cardiovascular Disease

## 2022-12-04 MED ORDER — LOSARTAN POTASSIUM 100 MG PO TABS: 100.0000 mg | ORAL_TABLET | Freq: Every day | ORAL | 3 refills | Status: DC

## 2022-12-04 NOTE — Telephone Encounter (Signed)
Per Dr. Johnsie Cancel, increase losartan 100 mg by mouth daily. Called patient to let him know. Patient agreed to change and will call our office if he has any questions.

## 2022-12-04 NOTE — Telephone Encounter (Signed)
Called patient's wife about her message. Patient's BP has been elevated for at least a few months. BP was high at last visit with Dr. Roxan Hockey, BP 173/94, he advised if BP continues to be above 140/90 to contact physician. Patient's wife called for Dr. Kyla Balzarine advisement, she stated she trust him. Patient's BPs have been running 140's/90's and HR 65 to 75. Patient has been taking Toprol 25 mg daily, HCTZ 12.5 mg daily, and Cozaar 50 mg by mouth daily. Patient has yearly follow-up scheduled for March. Will see if Dr. Johnsie Cancel wants to adjust medications or have patient see HTN clinic.

## 2022-12-04 NOTE — Telephone Encounter (Signed)
Pt c/o BP issue: STAT if pt c/o blurred vision, one-sided weakness or slurred speech  1. What are your last 5 BP readings? 145/91 HR 67; 146/90 HR 75  2. Are you having any other symptoms (ex. Dizziness, headache, blurred vision, passed out)? no  3. What is your BP issue?

## 2023-02-10 NOTE — Progress Notes (Signed)
CARDIOLOGY CONSULT NOTE       Patient ID: LEMARR Garrett MRN: [id number] DOB/AGE: October 15, 1951 72 y.o.  Admit date: (Not on file) Referring Physician: Orvan Seen  Primary Physician: Venia Carbon, MD Primary Cardiologist: Johnsie Cancel   HPI:  72 y.o. referred by Dr Silvio Pate for aortic aneurysm and CAD risk. History of PE prior to COVID infection earlier this year.  OSA HLD and HTN.  He follows with Dr Orvan Seen CV[facility] for his anuerysm.  CTA done 05/16/21 ascending aorta 4.7 cm  Noted some calcifications in LAD  Lung parenchyma with only mild linear atelectasis /scarring right lung base Echo done 02/26/18 showed EF 55-60% no LVH and tri leaflet AV He had a normal ETT 08/01/15   His PE was diagnosed in 02/25/18 with small burden in left lower lobe with no RV dilatation or hepatic reflux Hct is stable 43.6 LDL 103 He did have recurrent pneumonia's in 2008 and saw Dr Arlyce Dice which probably explains the RLL scarring 35 pack year history of smoking quit in 2017   ARB increased for HTN earlier this year  On chronic eliquis for PE On statin for HLD  Remarried last 10 years to high school friend He is overweight Likes to hunt and does walk his dog regularly   TTE done 08/29/21 EF 60-65% mild MR Ao 41 mm  Myovue 08/29/21 normal no ischemia EF 55%  PFT;s 09/16/21 severe restriction high DLCO no bronchodilator response  Has been seeing [facility] for above and OSA/CPAP.    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   Diverticulitis    pt denies   Hyperlipidemia    Hypertension    Mild ascending aorta dilatation (HCC)    4.8cm in diamter per CT chest, see imaging in epic dated  02-25-18   OSA (obstructive sleep apnea)    Pulmonary air embolism ([place]) 02/2018   lifetime eliquis since then    Sleep apnea    CPAP    Family History  Problem Relation Age of Onset   Hypertension Mother    Heart disease Mother    Kidney disease Mother    Heart disease Father    Stroke Father    Prostate cancer Father    Kidney disease Father    Heart disease Brother        cardioversion for arrhythmia   Diabetes Neg Hx    Colon cancer Neg Hx     Social History   Socioeconomic History   Marital status: Married    Spouse name: Not on file   Number of children: 2   Years of education: Not on file   Highest education level: Not on file  Occupational History   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  Tobacco Use   Smoking status: Former    Packs/day: 1.00    Years: 35.00    Additional pack years: 0.00    Total pack years: 35.00    Types: Cigarettes    Quit date: 12/16/2015    Years since quitting: 7.1    Passive exposure: Never   Smokeless  tobacco: Never  Vaping Use   Vaping Use: Never used  Substance and Sexual Activity   Alcohol use: Yes    Alcohol/week: 0.0 standard drinks of alcohol    Comment: occ   Drug use: No   Sexual activity: Not on file  Other Topics Concern   Not on file  Social History Narrative   Widowed   Remarried June 2014      Has living will   Wife is health care POA--- alternate is daughter, Magda Paganini   Would accept resuscitation    Probably no tube feeds if cognitively unaware.   Social Determinants of Health   Financial Resource Strain: Not on file  Food Insecurity: Not on file  Transportation Needs: Not on file  Physical Activity: Not on file  Stress: Not on file  Social Connections: Not on file  Intimate Partner Violence: Not on file    Past Surgical History:  Procedure Laterality Date   BACK SURGERY     COLONOSCOPY     THORACENTESIS  08/2006   left pleural effusion   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    Procedure: RIGHT TOTAL HIP ARTHROPLASTY ANTERIOR APPROACH;  Surgeon: Gaynelle Arabian, MD;  Location: [facility];  Service: Orthopedics;  Laterality: Right;  146min      Current Outpatient Medications:    albuterol (VENTOLIN HFA) 108 (90 Base) MCG/ACT inhaler, Inhale 2 puffs into the lungs every 6 (six) hours as needed for wheezing or shortness of breath., Disp: 8 g, Rfl: 0   Ascorbic Acid (VITAMIN C) 1000 MG tablet, Take 1,000 mg by mouth daily., Disp: , Rfl:    atorvastatin (LIPITOR) 40 MG tablet, TAKE 1 TABLET BY MOUTH ONCE DAILY, Disp: 90 tablet, Rfl: 3   ELIQUIS 5 MG TABS tablet, TAKE ONE TABLET BY MOUTH TWICE DAILY, Disp: 180 tablet, Rfl: 3   hydrochlorothiazide (HYDRODIURIL) 12.5 MG tablet, TAKE 1 TABLET BY MOUTH ONCE DAILY, Disp: 90 tablet, Rfl: 3   losartan (COZAAR) 100 MG tablet, Take 1 tablet (100 mg total) by mouth daily., Disp: 90 tablet, Rfl: 3   metoprolol succinate (TOPROL-XL) 25 MG 24 hr tablet, TAKE 1 TABLET BY MOUTH DAILY., Disp: 30 tablet, Rfl: 5   VITAMIN D PO, Take by mouth., Disp: , Rfl:     Physical Exam: There were no vitals taken for this visit.    Affect appropriate Healthy:  appears stated age 75: normal Neck supple with no adenopathy JVP normal no bruits no thyromegaly Lungs clear with no wheezing and good diaphragmatic motion Heart:  S1/S2 no murmur, no rub, gallop or click PMI normal Abdomen: benighn, BS positve, no tenderness, no AAA no bruit.  No HSM or HJR Distal pulses intact with no bruits No edema Neuro non-focal Skin warm and dry No muscular weakness   Labs:   Lab Results  Component Value Date   WBC 4.8 06/11/2022   HGB 14.9 06/11/2022   HCT 44.8 06/11/2022   MCV 91.3 06/11/2022   PLT 147.0 (L) 06/11/2022   No results for input(s): "NA", "K", "CL", "CO2", "BUN", "CREATININE", "CALCIUM", "PROT", "BILITOT", "ALKPHOS", "ALT", "AST", "GLUCOSE" in the last 168 hours.  Invalid input(s): "LABALBU" Lab Results  Component Value Date    TROPONINI <0.03 02/25/2018    Lab Results  Component Value Date   CHOL 176 06/11/2022   CHOL 176 04/26/2021   CHOL 151 04/24/2020   Lab Results  Component Value Date   HDL 54.30 06/11/2022   HDL 57.40 04/26/2021   HDL  54.60 04/24/2020   Lab Results  Component Value Date   LDLCALC 101 (H) 06/11/2022   LDLCALC 103 (H) 04/26/2021   LDLCALC 86 04/24/2020   Lab Results  Component Value Date   TRIG 104.0 06/11/2022   TRIG 75.0 04/26/2021   TRIG 52.0 04/24/2020   Lab Results  Component Value Date   CHOLHDL 3 06/11/2022   CHOLHDL 3 04/26/2021   CHOLHDL 3 04/24/2020   Lab Results  Component Value Date   LDLDIRECT 161.3 02/03/2011   LDLDIRECT 149.5 02/12/2010   LDLDIRECT 180.9 01/26/2008      Radiology: No results found.  EKG: SR rate 83 LAD 11/28/20 02/17/2023 SR rate 58 QRS 124 msec IVCD slight    ASSESSMENT AND PLAN:   Dyspnea:  likely combination of previous smoking, pneumonia, scarring, COVID and history of PE. Refered to pulmonary for restrictive lung dx moderate documented on PFTls  Sees Dr Shearon Stalls CAD:  calcification of LAD on CT normal ETT 2016 and normal  myovue 09/12/21 HLD:  on lipitor 40 mg LDL 103  HTN:  Well controlled.  Continue current medications and low sodium Dash type diet.   Aortic aneurysm:  f/u CVTS Dr Roxan Hockey  CTA 10/21/22 stable  4.7 cm  OSA:  continue CPAP use discussed weight loss   F/U pulmonary  F/U in a year    Signed: Jenkins Rouge 02/17/2023, 9:27 AM

## 2023-02-17 ENCOUNTER — Ambulatory Visit: Payer: PPO | Attending: Cardiovascular Disease | Admitting: Cardiovascular Disease

## 2023-02-17 ENCOUNTER — Encounter: Payer: Self-pay | Admitting: Cardiovascular Disease

## 2023-02-17 VITALS — BP 126/84 | HR 58 | Ht 75.0 in | Wt 317.0 lb

## 2023-02-17 DIAGNOSIS — I7121 Aneurysm of the ascending aorta, without rupture: Secondary | ICD-10-CM | POA: Diagnosis not present

## 2023-02-17 DIAGNOSIS — I7123 Aneurysm of the descending thoracic aorta, without rupture: Secondary | ICD-10-CM

## 2023-02-17 DIAGNOSIS — R0609 Other forms of dyspnea: Secondary | ICD-10-CM | POA: Diagnosis not present

## 2023-02-17 DIAGNOSIS — I2510 Atherosclerotic heart disease of native coronary artery without angina pectoris: Secondary | ICD-10-CM

## 2023-02-17 DIAGNOSIS — E785 Hyperlipidemia, unspecified: Secondary | ICD-10-CM

## 2023-02-17 MED ORDER — METOPROLOL SUCCINATE ER 25 MG PO TB24: 25.0000 mg | ORAL_TABLET | Freq: Every day | ORAL | 3 refills | Status: DC

## 2023-02-17 NOTE — Patient Instructions (Signed)
Medication Instructions:  Your physician recommends that you continue on your current medications as directed. Please refer to the Current Medication list given to you today.  *If you need a refill on your cardiac medications before your next appointment, please call your pharmacy*  Lab Work: If you have labs (blood work) drawn today and your tests are completely normal, you will receive your results only by: MyChart Message (if you have MyChart) OR A paper copy in the mail If you have any lab test that is abnormal or we need to change your treatment, we will call you to review the results.  Testing/Procedures: None ordered today.  Follow-Up: At [facility], you and your health needs are our priority.  As part of our continuing mission to provide you with exceptional heart care, we have created designated Provider Care Teams.  These Care Teams include your primary Cardiologist (physician) and Advanced Practice Providers (APPs -  Physician Assistants and Nurse Practitioners) who all work together to provide you with the care you need, when you need it.  We recommend signing up for the patient portal called "MyChart".  Sign up information is provided on this After Visit Summary.  MyChart is used to connect with patients for Virtual Visits (Telemedicine).  Patients are able to view lab/test results, encounter notes, upcoming appointments, etc.  Non-urgent messages can be sent to your provider as well.   To learn more about what you can do with MyChart, go to [email or web address].    Your next appointment:   1 year(s)  Provider:   Peter Nishan, MD      

## 2023-02-25 ENCOUNTER — Other Ambulatory Visit: Payer: Self-pay | Admitting: Internal Medicine

## 2023-03-05 ENCOUNTER — Other Ambulatory Visit: Payer: Self-pay | Admitting: Thoracic Surgery (Cardiothoracic Vascular Surgery)

## 2023-03-05 DIAGNOSIS — I7121 Aneurysm of the ascending aorta, without rupture: Secondary | ICD-10-CM

## 2023-03-26 ENCOUNTER — Other Ambulatory Visit: Payer: Self-pay | Admitting: Internal Medicine

## 2023-04-15 NOTE — Progress Notes (Signed)
[address]       [place] [id number]             [phone]        Russell Garrett [id number] Jun 06, 1951  History of Present Illness:  Russell Garrett is a 72 yo male with history of HTN, HLD, OSA, H/O PE, Former Smoker who quit in 2017, and Ascending aortic aneurysm.  This was initially diagnosed in 2018 and has slowly increased in size since that time.  He was last evaluated in our office by Dr. Dorris Garrett in 09/2022 at which time his Aneurysm measured 4.7 cm.  He was also noted to have some elevated BP and his was instructed to closely monitor and if persisted he would need to follow up with family provider for medication adjustment.  He presents today for 6 month surveillance.  The patient denies specific complaints.  He states that he has been better.  He suffered a knee injury in the past year which has resulted in weight gain.  Its been difficult to get the weight back down.  He is compliant with his medications.  Current Outpatient Medications on File Prior to Visit  Medication Sig Dispense Refill   albuterol (VENTOLIN HFA) 108 (90 Base) MCG/ACT inhaler Inhale 2 puffs into the lungs every 6 (six) hours as needed for wheezing or shortness of breath. 8 g 0   Ascorbic Acid (VITAMIN C) 1000 MG tablet Take 1,000 mg by mouth daily.     atorvastatin (LIPITOR) 40 MG tablet TAKE 1 TABLET BY MOUTH ONCE DAILY 90 tablet 0   ELIQUIS 5 MG TABS tablet TAKE ONE TABLET BY MOUTH TWICE DAILY 180 tablet 3   hydrochlorothiazide (HYDRODIURIL) 12.5 MG tablet TAKE 1 TABLET BY MOUTH ONCE DAILY 90 tablet 3   losartan (COZAAR) 100 MG tablet Take 1 tablet (100 mg total) by mouth daily. 90 tablet 3   metoprolol succinate (TOPROL-XL) 25 MG 24 hr tablet Take 1 tablet (25 mg total) by mouth daily. 90 tablet 3   VITAMIN D PO Take by mouth.     [DISCONTINUED] lisinopril-hydrochlorothiazide (PRINZIDE,ZESTORETIC) 10-12.5 MG per tablet Take 1 tablet by mouth daily. 30 tablet 11   No current  facility-administered medications on file prior to visit.     BP (!) 151/87 (BP Location: Left Arm, Patient Position: Sitting)   Pulse 65   Resp 20   Ht 6\' 3"  (1.905 m)   Wt (!) 314 lb (142.4 kg)   SpO2 90% Comment: RA  BMI 39.25 kg/m   Physical Exam  Gen: NAD, morbid obesity Neck: no bruit present Heart: RRR Lungs: CTA bilaterally Abd: soft non-tender, non-distended Ext: mild edema Neuro: grossly intact  CTA Results:  FINDINGS: Cardiovascular: SVC patent. Heart size normal. No pericardial effusion. The RV is nondilated. Satisfactory opacification of pulmonary arteries noted, and there is no evidence of acute pulmonary emboli. Partially occlusive chronic PE in lateral segmental right lower lobe branch (Im80,Se166, and 85/9) stable since previous. Scattered coronary calcifications. Good contrast opacification of the thoracic aorta, without dissection or stenosis. Scattered calcifications in the arch and descending thoracic aorta.   Aortic Root:   --Valve: 3.1 cm   --Sinuses: 4.3 cm   --Sinotubular Junction: 3.9 cm   Limitations by motion: Mild   Thoracic Aorta:   --Ascending Aorta: 4.6 cm (previously 4.7)   --Aortic Arch: 4.1 cm   --Descending Aorta: 3.1 cm   Mediastinum/Nodes: No mediastinal hematoma, mass or adenopathy.  Lungs/Pleura: No pleural effusion. No pneumothorax. 3 mm calcified subpleural granuloma, lateral right upper lobe (Im50,Se6) . chronic scarring/atelectasis laterally in the right lower lobe. Lungs otherwise clear.   Upper Abdomen: Chronic elevation of the right diaphragmatic leaflet. Bilobed 3.5 cm right upper pole renal cyst has decreased since 02/25/2018; no follow-up recommended. No acute findings.   Musculoskeletal: Vertebral endplate spurring at multiple levels in the lower thoracic spine. No acute findings.   Review of the MIP images confirms the above findings.   IMPRESSION: 1. Stable 4.6 cm ascending thoracic aortic  aneurysm. Ascending thoracic aortic aneurysm. Recommend semi-annual imaging followup by CTA or MRA and referral to cardiothoracic surgery if not already obtained. This recommendation follows 2010 ACCF/AHA/AATS/ACR/ASA/SCA/SCAI/SIR/STS/SVM Guidelines for the Diagnosis and Management of Patients With Thoracic Aortic Disease. Circulation. 2010; 121: N829-F621. Aortic aneurysm NOS (ICD10-I71.9) 2. 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 3. Coronary and aortic Atherosclerosis (ICD10-I70.0).     Electronically Signed   By: Corlis Leak M.D.   On: 04/16/2023 11:00    A/P:  Stable 4.6 cm Ascending Aortic Aneurysm- will repeat CT scan in 6 months H/O HTN- patients BP is elevated today, he states its very well controlled when checking at home.. counseled patient that if he notices this is elevating he needs to get additional adjustment on BP medications H/O PE on Eliquis H/O Nicotine Abuse, quit  Risk Modification:  Statin:  Yes  Smoking cessation instruction/counseling given:  Former use, quit long time ago  Patient was counseled on importance of Blood Pressure Control.  Despite Medical intervention if the patient notices persistently elevated blood pressure readings.  They are instructed to contact their Primary Care Physician  Please avoid use of Fluoroquinolones as this can potentially increase your risk of Aortic Rupture and/or Dissection  Patient educated on signs and symptoms of Aortic Dissection, handout also provided in AVS  Russell Cammarata, PA-C 04/21/23

## 2023-04-16 ENCOUNTER — Ambulatory Visit
Admission: RE | Admit: 2023-04-16 | Discharge: 2023-04-16 | Disposition: A | Discharge status: Home / Self Care | Payer: PPO | Source: Ambulatory Visit | Attending: Thoracic Surgery (Cardiothoracic Vascular Surgery) | Admitting: Thoracic Surgery (Cardiothoracic Vascular Surgery)

## 2023-04-16 DIAGNOSIS — I700 Atherosclerosis of aorta: Secondary | ICD-10-CM | POA: Diagnosis not present

## 2023-04-16 DIAGNOSIS — I2693 Single subsegmental pulmonary embolism without acute cor pulmonale: Secondary | ICD-10-CM | POA: Diagnosis not present

## 2023-04-16 DIAGNOSIS — I7121 Aneurysm of the ascending aorta, without rupture: Secondary | ICD-10-CM | POA: Diagnosis not present

## 2023-04-16 MED ORDER — IOPAMIDOL (ISOVUE-370) INJECTION 76%
75.0000 mL | Freq: Once | INTRAVENOUS | Status: AC | PRN
  Administered 2023-04-16: 75 mL via INTRAVENOUS

## 2023-04-21 ENCOUNTER — Ambulatory Visit: Payer: PPO | Admitting: Physician Assistant

## 2023-04-21 VITALS — BP 151/87 | HR 65 | Resp 20 | Ht 75.0 in | Wt 314.0 lb

## 2023-04-21 DIAGNOSIS — I7121 Aneurysm of the ascending aorta, without rupture: Secondary | ICD-10-CM

## 2023-06-17 ENCOUNTER — Encounter: Payer: Self-pay | Admitting: Internal Medicine

## 2023-06-17 ENCOUNTER — Ambulatory Visit: Payer: PPO | Admitting: Internal Medicine

## 2023-06-17 VITALS — BP 138/84 | HR 74 | Temp 98.1°F | Ht 73.0 in | Wt 312.0 lb

## 2023-06-17 DIAGNOSIS — Z0000 Encounter for general adult medical examination without abnormal findings: Secondary | ICD-10-CM | POA: Diagnosis not present

## 2023-06-17 DIAGNOSIS — G4733 Obstructive sleep apnea (adult) (pediatric): Secondary | ICD-10-CM | POA: Diagnosis not present

## 2023-06-17 DIAGNOSIS — I712 Thoracic aortic aneurysm, without rupture, unspecified: Secondary | ICD-10-CM | POA: Diagnosis not present

## 2023-06-17 DIAGNOSIS — I700 Atherosclerosis of aorta: Secondary | ICD-10-CM

## 2023-06-17 DIAGNOSIS — I82409 Acute embolism and thrombosis of unspecified deep veins of unspecified lower extremity: Secondary | ICD-10-CM | POA: Diagnosis not present

## 2023-06-17 LAB — COMPREHENSIVE METABOLIC PANEL
ALT: 23 U/L (ref 0–53)
AST: 22 U/L (ref 0–37)
Albumin: 4.3 g/dL (ref 3.5–5.2)
Alkaline Phosphatase: 67 U/L (ref 39–117)
BUN: 21 mg/dL (ref 6–23)
CO2: 29 mEq/L (ref 19–32)
Calcium: 9.7 mg/dL (ref 8.4–10.5)
Chloride: 103 mEq/L (ref 96–112)
Creatinine, Ser: 1.14 mg/dL (ref 0.40–1.50)
GFR: 64.49 mL/min (ref >60.00)
Glucose, Bld: 103 mg/dL — ABNORMAL HIGH (ref 70–99)
Potassium: 4.1 mEq/L (ref 3.5–5.1)
Sodium: 140 mEq/L (ref 135–145)
Total Bilirubin: 0.9 mg/dL (ref 0.2–1.2)
Total Protein: 7.4 g/dL (ref 6.0–8.3)

## 2023-06-17 LAB — LIPID PANEL
Cholesterol: 180 mg/dL (ref 0–200)
HDL: 50 mg/dL (ref >39.00)
LDL Cholesterol: 109 mg/dL — ABNORMAL HIGH (ref 0–99)
NonHDL: 130.18
Total CHOL/HDL Ratio: 4
Triglycerides: 108 mg/dL (ref 0.0–149.0)
VLDL: 21.6 mg/dL (ref 0.0–40.0)

## 2023-06-17 LAB — CBC
HCT: 44.1 % (ref 39.0–52.0)
Hemoglobin: 14.7 g/dL (ref 13.0–17.0)
MCHC: 33.3 g/dL (ref 30.0–36.0)
MCV: 94 fl (ref 78.0–100.0)
Platelets: 154 10*3/uL (ref 150.0–400.0)
RBC: 4.69 Mil/uL (ref 4.22–5.81)
RDW: 13.9 % (ref 11.5–15.5)
WBC: 4.5 10*3/uL (ref 4.0–10.5)

## 2023-06-17 MED ORDER — ATORVASTATIN CALCIUM 40 MG PO TABS: 40.0000 mg | ORAL_TABLET | Freq: Every day | ORAL | 3 refills | Status: DC

## 2023-06-17 NOTE — Assessment & Plan Note (Signed)
CT surgery now following every 6 months

## 2023-06-17 NOTE — Progress Notes (Signed)
Subjective:    Patient ID: Russell Garrett, male    DOB: 07/29/1951, 71 y.o.   MRN: [id number]  HPI Here for Medicare wellness visit and follow up of chronic health conditions Reviewed advanced directives Reviewed other doctors---Dr Hendrickson--CT surgery, Dr Nishan--cardiology, Dr Desai--pulmonary/sleep, Mr [facility], Dr Burgess Estelle-- opto, Dr Kim--derm, Dr Cleatis Polka Had laser eye surgery--to repair retinal tear (Dr Willette Alma) No other surgery or hospitalizations Still no exercise--has 10# limitation from surgeon--hard to maintain property (like chain saw) Vision is fair Hearing is okay Occasional drink (usually beer or margarita) No tobacco No falls No depression or anhedonia Independent with instrumental ADLs No sig memory problems (trouble with names)  Keeps up CT surgery Now screening every 6 months--but aneurysm is stable  No chest pain or SOB No dizziness or syncope Chronic edema---no change (uses support socks in winter). No pain No leg ulcers No palpitations No calf swelling to suggest return of DVT  Has lost weight--then gained it back BMI 41  Hyperextended left knee--tore part of hamstring Went to walk in clinic (usually sees Alusio) Got prednisone Then had bilateral knee pain  Current Outpatient Medications on File Prior to Visit  Medication Sig Dispense Refill   albuterol (VENTOLIN HFA) 108 (90 Base) MCG/ACT inhaler Inhale 2 puffs into the lungs every 6 (six) hours as needed for wheezing or shortness of breath. 8 g 0   Ascorbic Acid (VITAMIN C) 1000 MG tablet Take 1,000 mg by mouth daily.     atorvastatin (LIPITOR) 40 MG tablet TAKE 1 TABLET BY MOUTH ONCE DAILY 90 tablet 0   ELIQUIS 5 MG TABS tablet TAKE ONE TABLET BY MOUTH TWICE DAILY 180 tablet 3   hydrochlorothiazide (HYDRODIURIL) 12.5 MG tablet TAKE 1 TABLET BY MOUTH ONCE DAILY 90 tablet 3   losartan (COZAAR) 100 MG tablet Take 1 tablet (100 mg total) by mouth daily. 90 tablet 3    metoprolol succinate (TOPROL-XL) 25 MG 24 hr tablet Take 1 tablet (25 mg total) by mouth daily. 90 tablet 3   VITAMIN D PO Take by mouth.     [DISCONTINUED] lisinopril-hydrochlorothiazide (PRINZIDE,ZESTORETIC) 10-12.5 MG per tablet Take 1 tablet by mouth daily. 30 tablet 11   No current facility-administered medications on file prior to visit.    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   Diverticulitis    pt denies   Hamstring muscle strain 2024   Left Leg   Hyperlipidemia    Hypertension    Mild ascending aorta dilatation (HCC)    4.8cm in diamter per CT chest, see imaging in epic dated 02-25-18   OSA (obstructive sleep apnea)    Pulmonary air embolism (HCC) 02/2018   lifetime eliquis since then    Sleep apnea    CPAP    Past Surgical History:  Procedure Laterality Date   BACK SURGERY     COLONOSCOPY     REFRACTIVE SURGERY Right 07/25/2022   THORACENTESIS  08/2006   left pleural effusion   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   Procedure: RIGHT TOTAL HIP ARTHROPLASTY ANTERIOR APPROACH;  Surgeon: Ollen Gross, MD;  Location: [facility];  Service: Orthopedics;  Laterality: Right;     Family History  Problem Relation Age of Onset   Hypertension Mother    Heart disease Mother    Kidney disease Mother    Heart  disease Father    Stroke Father    Prostate cancer Father    Kidney disease Father    Heart disease Brother        cardioversion for arrhythmia   Diabetes Neg Hx    Colon cancer Neg Hx     Social History   Socioeconomic History   Marital status: Married    Spouse name: Not on file   Number of children: 2   Years of education: Not on file   Highest education level: Not on file  Occupational History   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  Tobacco Use   Smoking status: Former    Current packs/day: 0.00    Average packs/day: 1 pack/day  for 35.0 years (35.0 ttl pk-yrs)    Types: Cigarettes    Start date: 12/15/1980    Quit date: 12/16/2015    Years since quitting: 7.5    Passive exposure: Never   Smokeless tobacco: Never  Vaping Use   Vaping status: Never Used  Substance and Sexual Activity   Alcohol use: Yes    Alcohol/week: 0.0 standard drinks of alcohol    Comment: occ   Drug use: No   Sexual activity: Not on file  Other Topics Concern   Not on file  Social History Narrative   Widowed   Remarried June 2014      Has living will   Wife is health care POA--- alternate is daughter, Verlon Au   Would accept resuscitation    Probably no tube feeds if cognitively unaware.   Social Determinants of Health   Financial Resource Strain: Not on file  Food Insecurity: Not on file  Transportation Needs: Not on file  Physical Activity: Not on file  Stress: Not on file  Social Connections: Not on file  Intimate Partner Violence: Not on file   Review of Systems Appetite is okay Sleeps okay----uses CPAP daily Wears seat belt Teeth okay--keeps up with dentist No heartburn or dysphagia Bowels move fine--no blood Voids okay--stream is okay. Mild dribbling--but empties okay No suspicious skin lesions now Some back pain--may look for another chiropractor    Objective:   Physical Exam Constitutional:      Appearance: Normal appearance.  HENT:     Mouth/Throat:     Pharynx: No oropharyngeal exudate or posterior oropharyngeal erythema.  Eyes:     Conjunctiva/sclera: Conjunctivae normal.     Pupils: Pupils are equal, round, and reactive to light.  Cardiovascular:     Rate and Rhythm: Normal rate and regular rhythm.     Heart sounds: No murmur heard.    No gallop.     Comments: Faint pedal pulses  Pulmonary:     Effort: Pulmonary effort is normal.     Breath sounds: Normal breath sounds. No wheezing or rales.  Abdominal:     Palpations: Abdomen is soft.     Tenderness: There is no abdominal tenderness.   Musculoskeletal:     Cervical back: Neck supple.     Comments: Thick calves without pitting  Lymphadenopathy:     Cervical: No cervical adenopathy.  Skin:    Findings: No lesion or rash.  Neurological:     General: No focal deficit present.     Mental Status: He is alert and oriented to person, place, and time.     Comments: Word naming--13/1 minute Recall 3/3  Psychiatric:        Mood and Affect: Mood normal.  Behavior: Behavior normal.            Assessment & Plan:

## 2023-06-17 NOTE — Progress Notes (Signed)
Hearing Screening - Comments:: Passed whisper test Vision Screening - Comments:: January 2024

## 2023-06-17 NOTE — Assessment & Plan Note (Signed)
No symptoms of recurrence Had unprovoked PE also Will continue eliquis 5 bid

## 2023-06-17 NOTE — Assessment & Plan Note (Signed)
Uses CPAP nightly 

## 2023-06-17 NOTE — Assessment & Plan Note (Signed)
On imaging Is on eliquis and atorvastatin 40

## 2023-06-17 NOTE — Assessment & Plan Note (Signed)
I have personally reviewed the Medicare Annual Wellness questionnaire and have noted 1. The patient's medical and social history 2. Their use of alcohol, tobacco or illicit drugs 3. Their current medications and supplements 4. The patient's functional ability including ADL's, fall risks, home safety risks and hearing or visual             impairment. 5. Diet and physical activities 6. Evidence for depression or mood disorders  The patients weight, height, BMI and visual acuity have been recorded in the chart I have made referrals, counseling and provided education to the patient based review of the above and I have provided the pt with a written personalized care plan for preventive services.  I have provided you with a copy of your personalized plan for preventive services. Please take the time to review along with your updated medication list.  Renae Gloss him to try some exercise Last screening colon 2028 Done with PSA due to age Update COVID/flu this fall----and RSV vaccine

## 2023-06-17 NOTE — Assessment & Plan Note (Signed)
BMI still over 40 Discussed lifestyle changes to sustain moderate weight loss

## 2023-06-23 ENCOUNTER — Encounter: Payer: Self-pay | Admitting: Pharmacist

## 2023-06-23 NOTE — Progress Notes (Signed)
Pharmacy Quality Measure Review  This patient is appearing on a report for being at risk of failing the adherence measure for cholesterol (statin) medications this calendar year.   Medication: atorvastatin 40 mg daily  Last fill date: 03/26/23 for 90 day supply, due for refill later this week.   Will follow for refill.   Catie Eppie Gibson, PharmD, BCACP, CPP Clinical Pharmacist [facility] [phone]

## 2023-07-14 DIAGNOSIS — S61412A Laceration without foreign body of left hand, initial encounter: Secondary | ICD-10-CM | POA: Diagnosis not present

## 2023-07-22 DIAGNOSIS — Z4802 Encounter for removal of sutures: Secondary | ICD-10-CM | POA: Diagnosis not present

## 2023-08-12 DIAGNOSIS — H2513 Age-related nuclear cataract, bilateral: Secondary | ICD-10-CM | POA: Diagnosis not present

## 2023-08-12 DIAGNOSIS — H31001 Unspecified chorioretinal scars, right eye: Secondary | ICD-10-CM | POA: Diagnosis not present

## 2023-08-12 DIAGNOSIS — H5213 Myopia, bilateral: Secondary | ICD-10-CM | POA: Diagnosis not present

## 2023-08-12 DIAGNOSIS — H25013 Cortical age-related cataract, bilateral: Secondary | ICD-10-CM | POA: Diagnosis not present

## 2023-08-12 DIAGNOSIS — H524 Presbyopia: Secondary | ICD-10-CM | POA: Diagnosis not present

## 2023-08-12 DIAGNOSIS — H43813 Vitreous degeneration, bilateral: Secondary | ICD-10-CM | POA: Diagnosis not present

## 2023-08-12 DIAGNOSIS — H52203 Unspecified astigmatism, bilateral: Secondary | ICD-10-CM | POA: Diagnosis not present

## 2023-09-02 ENCOUNTER — Other Ambulatory Visit: Payer: Self-pay | Admitting: Thoracic Surgery (Cardiothoracic Vascular Surgery)

## 2023-09-02 DIAGNOSIS — I7121 Aneurysm of the ascending aorta, without rupture: Secondary | ICD-10-CM

## 2023-10-08 ENCOUNTER — Encounter: Payer: Self-pay | Admitting: Internal Medicine

## 2023-10-08 ENCOUNTER — Ambulatory Visit: Payer: PPO | Admitting: Internal Medicine

## 2023-10-08 VITALS — BP 112/84 | HR 56 | Ht 75.0 in | Wt 320.0 lb

## 2023-10-08 DIAGNOSIS — G4733 Obstructive sleep apnea (adult) (pediatric): Secondary | ICD-10-CM | POA: Diagnosis not present

## 2023-10-08 DIAGNOSIS — Z23 Encounter for immunization: Secondary | ICD-10-CM

## 2023-10-08 NOTE — Patient Instructions (Addendum)
It was a pleasure to see you today!  Please schedule follow up scheduled with myself in 12 months.  If my schedule is not open yet, we will contact you with a reminder closer to that time. Please call [phone] if you haven't heard from [place] a month before, and always call us sooner if issues or concerns arise. You can also send [place] a message through MyChart, but but aware that this is not to be used for urgent issues and it may take up to 5-7 days to receive a reply. Please be aware that you will likely be able to view your results before I have a chance to respond to them. Please give [place] 5 business days to respond to any non-urgent results.    CPAP report looks great. Continue current settings.  Flu shot today Call me if you need me sooner.

## 2023-10-08 NOTE — Progress Notes (Signed)
Russell Garrett    [id number]    1951/02/02  Primary Care Physician:Letvak, Berneda Rose, MD Date of Appointment: 10/08/2023 Established Patient Visit  Chief complaint:   Chief Complaint  Patient presents with   Follow-up    Pt is here for OSA/CPAP F/U visit. Pt request FLU vaccine.     HPI: CATRELL THORNBERRY is a 72 y.o. man with OSA on CPAP.   Interval Updates: Here for CPAP follow up. Doing well on cpap machine.   CPAP download reviewed which shows 100% adherence over 8 hours, minimal leak, auto cpap 10-20 with median pressure 10.4 cm H20, AHI 0.3.   Interval history had laser surgery in his eyes. Having some knee pain.    Social Hx: Retired from working in a Immunologist. Lives in [place]. Lives at home with wife, pet dog. FIL recently went into memory care.  30+ pack year smoking history. Quit in 2017.  I have reviewed the patient's family social and past medical history and updated as appropriate.   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   Diverticulitis    pt denies   Hamstring muscle strain 2024   Left Leg   Hyperlipidemia    Hypertension    Mild ascending aorta dilatation (HCC)    4.8cm in diamter per CT chest, see imaging in epic dated 02-25-18   OSA (obstructive sleep apnea)    Pulmonary air embolism (HCC) 02/2018   lifetime eliquis since then    Sleep apnea    CPAP    Past Surgical History:  Procedure Laterality Date   BACK SURGERY     COLONOSCOPY     REFRACTIVE SURGERY Right 07/25/2022   THORACENTESIS  08/2006   left pleural effusion   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   Procedure: RIGHT TOTAL HIP ARTHROPLASTY ANTERIOR APPROACH;  Surgeon: Ollen Gross, MD;  Location: [facility];  Service: Orthopedics;  Laterality: Right;     Family History  Problem Relation Age of Onset   Hypertension Mother    Heart disease Mother    Kidney disease Mother     Heart disease Father    Stroke Father    Prostate cancer Father    Kidney disease Father    Heart disease Brother        cardioversion for arrhythmia   Diabetes Neg Hx    Colon cancer Neg Hx     Social History   Occupational History   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  Tobacco Use   Smoking status: Former    Current packs/day: 0.00    Average packs/day: 1 pack/day for 35.0 years (35.0 ttl pk-yrs)    Types: Cigarettes    Start date: 12/15/1980    Quit date: 12/16/2015    Years since quitting: 7.8    Passive exposure: Never   Smokeless tobacco: Never  Vaping Use   Vaping status: Never Used  Substance and Sexual Activity   Alcohol use: Yes    Alcohol/week: 0.0 standard drinks of alcohol    Comment: occ   Drug use: No   Sexual activity: Not on file     Physical Exam: Blood pressure 112/84, pulse (!) 56, height 6\' 3"  (1.905 m), weight (!) 320 lb (145.2 kg), SpO2 95%.  Gen:      No acute distress,  obese ENT:  no nasal polyps, mucus membranes moist Lungs:    No increased respiratory effort, symmetric chest wall excursion, clear to auscultation bilaterally, no wheezes or crackles CV:         Regular rate and rhythm; no murmurs, rubs, or gallops.  No pedal edema   Data Reviewed: Imaging: I have personally reviewed the CT Chest June 2022 which shows stable RLL  post infectious scarring and a ascending aortic aneurysm. No emphysema. No nodules.   PFTs:      Latest Ref Rng & Units 09/16/2021    3:46 PM  PFT Results  FVC-Pre L 2.81   FVC-Predicted Pre % 53   FVC-Post L 2.78   FVC-Predicted Post % 52   Pre FEV1/FVC % % 81   Post FEV1/FCV % % 80   FEV1-Pre L 2.28   FEV1-Predicted Pre % 58   FEV1-Post L 2.23   DLCO uncorrected ml/min/mmHg 24.33   DLCO UNC% % 80   DLCO corrected ml/min/mmHg 24.33   DLCO COR %Predicted % 80   DLVA Predicted % 141   TLC L 5.28   TLC % Predicted % 65   RV % Predicted % 92    I have personally reviewed the patient's  PFTs and moderately severe restriction to ventilation secondary to body habitus.   Labs: Lab Results  Component Value Date   WBC 4.5 06/17/2023   HGB 14.7 06/17/2023   HCT 44.1 06/17/2023   MCV 94.0 06/17/2023   PLT 154.0 06/17/2023   Lab Results  Component Value Date   NA 140 06/17/2023   K 4.1 06/17/2023   CL 103 06/17/2023   CO2 29 06/17/2023    Immunization status: Immunization History  Administered Date(s) Administered   Fluad Quad(high Dose 65+) 07/27/2019, 08/16/2020, 08/28/2021, 10/03/2022   H1N1 02/09/2009   Influenza Split 08/06/2011, 08/18/2012, 10/09/2015   Influenza Whole 08/16/2010   Influenza,inj,Quad PF,6+ Mos 09/20/2013, 10/04/2016, 09/25/2018   Influenza,inj,quad, With Preservative 11/23/2017   Influenza-Unspecified 09/24/2016   PFIZER(Purple Top)SARS-COV-2 Vaccination 01/07/2020, 01/31/2020, 11/22/2020   Pneumococcal Conjugate-13 03/17/2017   Pneumococcal Polysaccharide-23 03/19/2018   Pneumococcal-Unspecified 02/22/2013   Td 12/06/1996, 01/25/2008   Tdap 03/19/2018   Tetanus 11/24/2016   Zoster Recombinant(Shingrix) 05/02/2021, 07/22/2021   Zoster, Live 03/11/2013    Assessment:  OSA on CPAP - well controlled Ascending Aortic Aneurysm History of Tobacco use  Plan/Recommendations: CPAP report looks great. Continue current settings.   Call me if you need me sooner.   Flu shot today.   Talk to Dr. Alphonsus Sias about half dose dosing for your eliquis for prophylaxis.    Return to Care: Return in about 1 year (around 10/07/2024). For OSA.    Durel Salts, MD Pulmonary and Critical Care Medicine [facility] [phone]

## 2023-10-13 ENCOUNTER — Ambulatory Visit
Admission: RE | Admit: 2023-10-13 | Discharge: 2023-10-13 | Disposition: A | Discharge status: Home / Self Care | Payer: PPO | Source: Ambulatory Visit | Attending: Thoracic Surgery (Cardiothoracic Vascular Surgery) | Admitting: Thoracic Surgery (Cardiothoracic Vascular Surgery)

## 2023-10-13 ENCOUNTER — Other Ambulatory Visit: Payer: Self-pay | Admitting: Cardiovascular Disease

## 2023-10-13 DIAGNOSIS — I2510 Atherosclerotic heart disease of native coronary artery without angina pectoris: Secondary | ICD-10-CM | POA: Diagnosis not present

## 2023-10-13 DIAGNOSIS — I7121 Aneurysm of the ascending aorta, without rupture: Secondary | ICD-10-CM | POA: Diagnosis not present

## 2023-10-13 MED ORDER — IOPAMIDOL (ISOVUE-370) INJECTION 76%
75.0000 mL | Freq: Once | INTRAVENOUS | Status: AC | PRN
  Administered 2023-10-13: 75 mL via INTRAVENOUS

## 2023-10-16 NOTE — Progress Notes (Unsigned)
[address]       [place] [address]             [phone]    PCP is Karie Schwalbe, MD Referring Provider is Karie Schwalbe, MD  Chief Complaint: Ascending thoracic aortic aneurysm   HPI: This is a 72 year old male with a past medical history of hypertension, hyperlipidemia, OSA, PE, remote tobacco abuse who was found to have an ascending thoracic aneurysm in 2018. He  has been followed by our office since that time. He was last seen by My colleague in May 2024 and at that time, the ATAA measured. He denies chest pain, pressure, or tightness.  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   Diverticulitis    pt denies   Hamstring muscle strain 2024   Left Leg   Hyperlipidemia    Hypertension    Mild ascending aorta dilatation (HCC)    4.8cm in diamter per CT chest, see imaging in epic dated 02-25-18   OSA (obstructive sleep apnea)    Pulmonary air embolism (HCC) 02/2018   lifetime eliquis since then    Sleep apnea    CPAP    Past Surgical History:  Procedure Laterality Date   BACK SURGERY     COLONOSCOPY     REFRACTIVE SURGERY Right 07/25/2022   THORACENTESIS  08/2006   left pleural effusion   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   Procedure: RIGHT TOTAL HIP ARTHROPLASTY ANTERIOR APPROACH;  Surgeon: Ollen Gross, MD;  Location: [facility];  Service: Orthopedics;  Laterality: Right;     Family History  Problem Relation Age of Onset   Hypertension Mother    Heart disease Mother    Kidney disease Mother    Heart disease Father    Stroke Father    Prostate cancer Father    Kidney disease Father    Heart disease Brother        cardioversion for arrhythmia   Diabetes Neg Hx    Colon cancer Neg Hx     Social History Social History   Tobacco Use   Smoking status: Former    Current packs/day: 0.00    Average packs/day: 1 pack/day for 35.0 years  (35.0 ttl pk-yrs)    Types: Cigarettes    Start date: 12/15/1980    Quit date: 12/16/2015    Years since quitting: 7.8    Passive exposure: Never   Smokeless tobacco: Never  Vaping Use   Vaping status: Never Used  Substance Use Topics   Alcohol use: Yes    Alcohol/week: 0.0 standard drinks of alcohol    Comment: occ   Drug use: No    Current Outpatient Medications  Medication Sig Dispense Refill   albuterol (VENTOLIN HFA) 108 (90 Base) MCG/ACT inhaler Inhale 2 puffs into the lungs every 6 (six) hours as needed for wheezing or shortness of breath. 8 g 0   Ascorbic Acid (VITAMIN C) 1000 MG tablet Take 1,000 mg by mouth daily.     atorvastatin (LIPITOR) 40 MG tablet Take 1 tablet (40 mg total) by mouth daily. 90 tablet 3   ELIQUIS 5 MG TABS tablet TAKE ONE TABLET BY MOUTH TWICE DAILY 180 tablet 3   hydrochlorothiazide (HYDRODIURIL) 12.5 MG tablet TAKE 1 TABLET BY MOUTH ONCE DAILY 90 tablet 3   losartan (COZAAR)  100 MG tablet TAKE 1 TABLET BY MOUTH DAILY 90 tablet 0   metoprolol succinate (TOPROL-XL) 25 MG 24 hr tablet Take 1 tablet (25 mg total) by mouth daily. 90 tablet 3   VITAMIN D PO Take by mouth.    Allergies: No Known Allergies  Review of Systems  Chest Pain [  ] Resting SOB [ ]  Exertional SOB [  ]  Pedal Edema [  ] Syncope [  ] Presyncope [  ]  General Review of Systems: [Y] = yes [ N]=no  Consitutional:   nausea [ ] ;  fever [ ] ;  Eye : blurred vision [ ] ; Amaurosis fugax[  ];  Resp: cough [ ] ;  hemoptysis[ ] ;  GI: vomiting[ ] ; melena[ ] ; hematochezia [] ;  [id number] ; Musculoskeletal: myalgias[ ] ; joint swelling[  ]; joint erythema[ ] ;  Heme/Lymph: anemia[ ] ;  Neuro: TIA[ ] ;stroke[ ] ;  seizures[ ] ;  Endocrine: diabetes[ ] ;   Vital Signs: Physical Exam CV- Neck Pulmonary Abdomen Extremities Neurologic   Diagnostic Tests:  Narrative & Impression  CLINICAL DATA:  Thoracic aortic aneurysm, follow-up examination   EXAM: CT ANGIOGRAPHY CHEST WITH  CONTRAST   TECHNIQUE: Multidetector CT imaging of the chest was performed using the standard protocol during bolus administration of intravenous contrast. Multiplanar CT image reconstructions and MIPs were obtained to evaluate the vascular anatomy.   RADIATION DOSE REDUCTION: This exam was performed according to the departmental dose-optimization program which includes automated exposure control, adjustment of the mA and/or kV according to patient size and/or use of iterative reconstruction technique.   CONTRAST:  75mL ISOVUE-370 IOPAMIDOL (ISOVUE-370) INJECTION 76%   COMPARISON:  04/16/2023   FINDINGS: Cardiovascular: There is adequate opacification of the thoracic aorta. Fusiform ascending thoracic aortic aneurysm is stable measuring 4.7 cm in greatest diameter. Proximal descending thoracic aorta is mildly dilated measuring 3.2 cm in diameter. Distal descending thoracic aorta is of normal caliber measuring 2.8 cm in diameter. No intramural hematoma or dissection. Moderate atherosclerotic calcification again noted. Bovine arch anatomy noted with wide patency of the arch vasculature proximally.   Central pulmonary arteries are of normal caliber. Intraluminal web within the right lower lobe pulmonary artery again identified in keeping with chronic pulmonary embolism with evidence of chronic thrombosis of these vessels. No intraluminal filling defect identified to suggest acute pulmonary embolism.   Moderate multi-vessel coronary artery calcification. Global cardiac size within normal limits. No pericardial effusion.   Mediastinum/Nodes: No enlarged mediastinal, hilar, or axillary lymph nodes. Thyroid gland, trachea, and esophagus demonstrate no significant findings.   Lungs/Pleura: Benign calcified granuloma within the subpleural right upper lobe. Mild bibasilar and scattered subpleural parenchymal scarring. No confluent pulmonary infiltrate or indeterminate pulmonary  nodules. No pneumothorax or pleural effusion. Stable elevation of the right hemidiaphragm with right lower lobe volume loss. No central obstructing lesion.   Upper Abdomen: No acute abnormality.   Musculoskeletal: The osseous structures are age-appropriate no acute bone abnormality. No lytic or blastic bone lesion.   Review of the MIP images confirms the above findings.   IMPRESSION: 1. Stable fusiform ascending thoracic aortic aneurysm measuring 4.7 cm in greatest diameter. Stable mild dilatation of the proximal descending thoracic aorta measuring 3.2 cm in diameter. Recommend semi-annual imaging followup by CTA or MRA and referral to cardiothoracic surgery if not already obtained. This recommendation follows 2010 ACCF/AHA/AATS/ACR/ASA/SCA/SCAI/SIR/STS/SVM Guidelines for the Diagnosis and Management of Patients With Thoracic Aortic Disease. Circulation. 2010; 121: T517-O160. Aortic aneurysm NOS (ICD10-I71.9) 2. Moderate multi-vessel coronary artery calcification. 3. Stable intraluminal  web within the right lower lobe pulmonary artery in keeping with chronic pulmonary embolism with evidence of chronic thrombosis of these vessels. No intraluminal filling defect identified to suggest acute pulmonary embolism. 4. Stable elevation of the right hemidiaphragm with right lower lobe volume loss. No central obstructing lesion.   Aortic Atherosclerosis (ICD10-I70.0).     Electronically Signed   By: Helyn Numbers M.D.   On: 10/13/2023 14:38   Risk Modification in those with ascending thoracic aortic aneurysm:  Continue good control of blood pressure (prefer SBP 130/80 or less)  2. Avoid fluoroquinolone antibiotics (I.e Ciprofloxacin, Avelox, Levofloxacin, Ofloxacin)  3.  Use of statin (to decrease cardiovascular risk)  4.  Exercise and activity limitations is individualized, but in general, contact sports are to be  avoided and one should avoid heavy lifting (defined as half of  ideal body weight) and exercises involving sustained Valsalva maneuver.  5. Counseling for those suspected of having genetically mediated disease. First-degree relatives of those with TAA disease should be screened as well as those who have a connective tissue disease (I.e with Marfan syndrome, Ehlers-Danlos syndrome,  and Loeys-Dietz syndrome) or a  bicuspid aortic valve,have an increased risk for  complications related to TAA.He has no history of connective tissue in the family. Echocardiogram done in October 2022 showed no evidence of aortic stenosis or regurgitation.  6. If one has tobacco abuse, smoking cessation is highly encouraged.   Impression and Plan: CTA with a 4.7 cm ascending aortic aneurysm.  Echocardiogram done in October 2022 showed no evidence of aortic stenosis or regurgitation.  We discussed the natural history and and risk factors for growth of ascending aortic aneurysms.  We covered the importance of smoking cessation, tight blood pressure control, refraining from lifting heavy objects, and avoiding fluoroquinolones.  The patient is aware of signs and symptoms of aortic dissection and when to present to the emergency department.  We will continue surveillance and a repeat CTA was ordered for 6 months.     Ardelle Balls, PA-C [facility] [phone]

## 2023-10-21 ENCOUNTER — Ambulatory Visit: Payer: PPO

## 2023-10-21 ENCOUNTER — Encounter: Payer: Self-pay | Admitting: Physician Assistant

## 2023-10-21 VITALS — BP 152/80 | HR 58 | Resp 20 | Ht 75.0 in | Wt 320.0 lb

## 2023-10-21 DIAGNOSIS — I7121 Aneurysm of the ascending aorta, without rupture: Secondary | ICD-10-CM | POA: Diagnosis not present

## 2023-10-21 NOTE — Patient Instructions (Addendum)
Risk Modification in those with ascending thoracic aortic aneurysm:  Continue good control of blood pressure (prefer SBP 130/80 or less)-continue with Losartan (Cozaar), Toprol XL, and hydrochlorothiazide.  2. Avoid fluoroquinolone antibiotics (I.e Ciprofloxacin, Avelox, Levofloxacin, Ofloxacin)  3.  Use of statin (to decrease cardiovascular risk)  4.  Exercise and activity limitations is individualized, but in general, contact sports are to be  avoided and one should avoid heavy lifting (defined as half of ideal body weight) and exercises involving sustained Valsalva maneuver.  5. Counseling for those suspected of having genetically mediated disease. First-degree relatives of those with TAA disease should be screened as well as those who have a connective tissue disease (I.e with Marfan syndrome, Ehlers-Danlos syndrome,  and Loeys-Dietz syndrome) or a  bicuspid aortic valve,have an increased risk for complications related to TAA.He has no history of connective tissue in the family. Echocardiogram done in October 2022 showed no evidence of aortic stenosis or regurgitation.  6. He has a remote history of tobacco abuse;quit 2017

## 2023-11-24 ENCOUNTER — Ambulatory Visit (INDEPENDENT_AMBULATORY_CARE_PROVIDER_SITE_OTHER)
Admission: RE | Admit: 2023-11-24 | Discharge: 2023-11-24 | Disposition: A | Discharge status: Home / Self Care | Payer: PPO | Source: Ambulatory Visit | Attending: Family Medicine | Admitting: Family Medicine

## 2023-11-24 ENCOUNTER — Encounter: Payer: Self-pay | Admitting: Family Medicine

## 2023-11-24 ENCOUNTER — Ambulatory Visit (INDEPENDENT_AMBULATORY_CARE_PROVIDER_SITE_OTHER): Payer: PPO | Admitting: Family Medicine

## 2023-11-24 VITALS — BP 140/76 | HR 73 | Temp 98.4°F | Ht 75.0 in | Wt 321.5 lb

## 2023-11-24 DIAGNOSIS — R053 Chronic cough: Secondary | ICD-10-CM | POA: Diagnosis not present

## 2023-11-24 DIAGNOSIS — R9389 Abnormal findings on diagnostic imaging of other specified body structures: Secondary | ICD-10-CM | POA: Diagnosis not present

## 2023-11-24 DIAGNOSIS — R918 Other nonspecific abnormal finding of lung field: Secondary | ICD-10-CM | POA: Diagnosis not present

## 2023-11-24 MED ORDER — GUAIFENESIN-CODEINE 100-10 MG/5ML PO SOLN: 5.0000 mL | Freq: Every day | ORAL | 0 refills | Status: DC

## 2023-11-24 MED ORDER — PREDNISONE 20 MG PO TABS: ORAL_TABLET | ORAL | 0 refills | Status: DC

## 2023-11-24 MED ORDER — AZITHROMYCIN 250 MG PO TABS: ORAL_TABLET | ORAL | 0 refills | Status: DC

## 2023-11-24 NOTE — Assessment & Plan Note (Signed)
 Acute, likely initial viral infection possibly RSV given amount of wheezing, given persistent greater than 3 weeks some amount of concern for bacterial superinfection.  Will evaluate with chest x-ray given more focal wheezing on right.  Of note patient has a history of pneumonia and right lung. Depending on x-ray results we will choose an antibiotic.  Treat with prednisone  taper and cough suppressant as needed. Return and ER precautions provided.

## 2023-11-24 NOTE — Addendum Note (Signed)
 Addended by: Kerby Nora E on: 11/24/2023 01:26 PM   Modules accepted: Orders

## 2023-11-24 NOTE — Progress Notes (Signed)
 Patient ID: Russell Garrett, male    DOB: 08/09/1951, 72 y.o.   MRN: [id number]  This visit was conducted in person.  BP (!) 140/76 (BP Location: Right Arm, Patient Position: Sitting, Cuff Size: Large)   Pulse 73   Temp 98.4 F (36.9 C) (Temporal)   Ht 6' 3 (1.905 m)   Wt (!) 321 lb 8 oz (145.8 kg)   SpO2 96%   BMI 40.18 kg/m    CC:  Chief Complaint  Patient presents with   Cough    X 3 weeks   Wheezing    Subjective:   HPI: Russell Garrett is a 72 y.o. male  patient of Dr. Marval with history of  mornbid obesity presenting on 11/24/2023 for Cough (X 3 weeks) and Wheezing  Date of onset:  3 weeks Initial symptoms included  cough Symptoms progressed to runny nose, chest congestion, greenish sputum  No ear pain, no face pain.  No fever.  Has noted some wheeze off and on.   Cough keeping him up at night.  No SOB,  hears noises occ when breathing.   Sick contacts:  none, but did go to the beach prior to illness COVID testing:   none     He has tried to treat with  Nyquil, Dayquil. Coricidin.     No history of chronic lung disease such as asthma or COPD. Former smoker.       Relevant past medical, surgical, family and social history reviewed and updated as indicated. Interim medical history since our last visit reviewed. Allergies and medications reviewed and updated. Outpatient Medications Prior to Visit  Medication Sig Dispense Refill   albuterol  (VENTOLIN  HFA) 108 (90 Base) MCG/ACT inhaler Inhale 2 puffs into the lungs every 6 (six) hours as needed for wheezing or shortness of breath. 8 g 0   Ascorbic Acid (VITAMIN C) 1000 MG tablet Take 1,000 mg by mouth daily.     atorvastatin  (LIPITOR) 40 MG tablet Take 1 tablet (40 mg total) by mouth daily. 90 tablet 3   ELIQUIS  5 MG TABS tablet TAKE ONE TABLET BY MOUTH TWICE DAILY 180 tablet 3   hydrochlorothiazide  (HYDRODIURIL ) 12.5 MG tablet TAKE 1 TABLET BY MOUTH ONCE DAILY 90 tablet 3   losartan  (COZAAR ) 100 MG tablet  TAKE 1 TABLET BY MOUTH DAILY 90 tablet 0   metoprolol  succinate (TOPROL -XL) 25 MG 24 hr tablet Take 1 tablet (25 mg total) by mouth daily. 90 tablet 3   VITAMIN D  PO Take by mouth.     No facility-administered medications prior to visit.     Per HPI unless specifically indicated in ROS section below Review of Systems  Constitutional:  Negative for fatigue and fever.  HENT:  Positive for congestion. Negative for ear pain.   Eyes:  Negative for pain.  Respiratory:  Positive for cough and wheezing. Negative for shortness of breath.   Cardiovascular:  Negative for chest pain, palpitations and leg swelling.  Gastrointestinal:  Negative for abdominal pain.  Genitourinary:  Negative for dysuria.  Musculoskeletal:  Negative for arthralgias.  Neurological:  Negative for syncope, light-headedness and headaches.  Psychiatric/Behavioral:  Negative for dysphoric mood.    Objective:  BP (!) 140/76 (BP Location: Right Arm, Patient Position: Sitting, Cuff Size: Large)   Pulse 73   Temp 98.4 F (36.9 C) (Temporal)   Ht 6' 3 (1.905 m)   Wt (!) 321 lb 8 oz (145.8 kg)   SpO2 96%   BMI  40.18 kg/m   Wt Readings from Last 3 Encounters:  11/24/23 (!) 321 lb 8 oz (145.8 kg)  10/21/23 (!) 320 lb (145.2 kg)  10/08/23 (!) 320 lb (145.2 kg)      Physical Exam Vitals reviewed.  Constitutional:      Appearance: He is well-developed.  HENT:     Head: Normocephalic.     Right Ear: Hearing normal.     Left Ear: Hearing normal.     Nose: Nose normal.  Neck:     Thyroid : No thyroid  mass or thyromegaly.     Vascular: No carotid bruit.     Trachea: Trachea normal.  Cardiovascular:     Rate and Rhythm: Normal rate and regular rhythm.     Pulses: Normal pulses.     Heart sounds: Heart sounds not distant. No murmur heard.    No friction rub. No gallop.     Comments: No peripheral edema Pulmonary:     Effort: Pulmonary effort is normal. No respiratory distress.     Breath sounds: Examination of the  right-upper field reveals wheezing. Examination of the right-middle field reveals wheezing. Examination of the right-lower field reveals wheezing. Wheezing present.  Skin:    General: Skin is warm and dry.     Findings: No rash.  Psychiatric:        Speech: Speech normal.        Behavior: Behavior normal.        Thought Content: Thought content normal.       Results for orders placed or performed in visit on 06/17/23  Comprehensive metabolic panel   Collection Time: 06/17/23  9:06 AM  Result Value Ref Range   Sodium 140 135 - 145 mEq/L   Potassium 4.1 3.5 - 5.1 mEq/L   Chloride 103 96 - 112 mEq/L   CO2 29 19 - 32 mEq/L   Glucose, Bld 103 (H) 70 - 99 mg/dL   BUN 21 6 - 23 mg/dL   Creatinine, Ser 8.85 0.40 - 1.50 mg/dL   Total Bilirubin 0.9 0.2 - 1.2 mg/dL   Alkaline Phosphatase 67 39 - 117 U/L   AST 22 0 - 37 U/L   ALT 23 0 - 53 U/L   Total Protein 7.4 6.0 - 8.3 g/dL   Albumin 4.3 3.5 - 5.2 g/dL   GFR 35.50 >39.99 mL/min   Calcium  9.7 8.4 - 10.5 mg/dL  Lipid panel   Collection Time: 06/17/23  9:06 AM  Result Value Ref Range   Cholesterol 180 0 - 200 mg/dL   Triglycerides 891.9 0.0 - 149.0 mg/dL   HDL 49.99 >60.99 mg/dL   VLDL 78.3 0.0 - 59.9 mg/dL   LDL Cholesterol 890 (H) 0 - 99 mg/dL   Total CHOL/HDL Ratio 4    NonHDL 130.18   CBC   Collection Time: 06/17/23  9:06 AM  Result Value Ref Range   WBC 4.5 4.0 - 10.5 K/uL   RBC 4.69 4.22 - 5.81 Mil/uL   Platelets 154.0 150.0 - 400.0 K/uL   Hemoglobin 14.7 13.0 - 17.0 g/dL   HCT 55.8 60.9 - 47.9 %   MCV 94.0 78.0 - 100.0 fl   MCHC 33.3 30.0 - 36.0 g/dL   RDW 86.0 88.4 - 84.4 %    Assessment and Plan  Persistent cough for 3 weeks or longer Assessment & Plan: Acute, likely initial viral infection possibly RSV given amount of wheezing, given persistent greater than 3 weeks some amount of concern for bacterial  superinfection.  Will evaluate with chest x-ray given more focal wheezing on right.  Of note patient has a  history of pneumonia and right lung. Depending on x-ray results we will choose an antibiotic.  Treat with prednisone  taper and cough suppressant as needed. Return and ER precautions provided.  Orders: -     DG Chest 2 View; Future  Other orders -     predniSONE ; 3 tabs by mouth daily x 3 days, then 2 tabs by mouth daily x 2 days then 1 tab by mouth daily x 2 days  Dispense: 15 tablet; Refill: 0 -     guaiFENesin -Codeine ; Take 5-10 mLs by mouth at bedtime.  Dispense: 100 mL; Refill: 0    No follow-ups on file.   Greig Ring, MD

## 2024-01-07 ENCOUNTER — Other Ambulatory Visit: Payer: Self-pay | Admitting: Cardiovascular Disease

## 2024-02-01 ENCOUNTER — Other Ambulatory Visit: Payer: Self-pay | Admitting: Internal Medicine

## 2024-02-10 ENCOUNTER — Other Ambulatory Visit: Payer: Self-pay | Admitting: Cardiovascular Disease

## 2024-02-22 ENCOUNTER — Other Ambulatory Visit: Payer: Self-pay | Admitting: Internal Medicine

## 2024-03-04 ENCOUNTER — Other Ambulatory Visit: Payer: Self-pay | Admitting: Thoracic Surgery (Cardiothoracic Vascular Surgery)

## 2024-03-04 DIAGNOSIS — I7121 Aneurysm of the ascending aorta, without rupture: Secondary | ICD-10-CM

## 2024-04-04 ENCOUNTER — Ambulatory Visit
Admission: RE | Admit: 2024-04-04 | Discharge: 2024-04-04 | Disposition: A | Discharge status: Home / Self Care | Source: Ambulatory Visit | Attending: Thoracic Surgery (Cardiothoracic Vascular Surgery) | Admitting: Thoracic Surgery (Cardiothoracic Vascular Surgery)

## 2024-04-04 ENCOUNTER — Ambulatory Visit: Attending: Thoracic Surgery (Cardiothoracic Vascular Surgery) | Admitting: Surgical

## 2024-04-04 VITALS — BP 157/82 | HR 56 | Resp 20 | Ht 75.0 in | Wt 313.0 lb

## 2024-04-04 DIAGNOSIS — I700 Atherosclerosis of aorta: Secondary | ICD-10-CM | POA: Diagnosis not present

## 2024-04-04 DIAGNOSIS — I7121 Aneurysm of the ascending aorta, without rupture: Secondary | ICD-10-CM

## 2024-04-04 MED ORDER — IOPAMIDOL (ISOVUE-370) INJECTION 76%
75.0000 mL | Freq: Once | INTRAVENOUS | Status: AC | PRN
  Administered 2024-04-04: 75 mL via INTRAVENOUS

## 2024-04-04 NOTE — Progress Notes (Signed)
 [address]       [place] [address]             [phone]        Russell Garrett [id number] Nov 06, 1951  History of Present Illness: The patient is a 73 year old male who is being seen in ongoing follow-up/surveillance of his ascending aortic aneurysm.  He was last seen in November of 2024 at which time the aneurysm measured 4.7 cm.  A repeat scan was done on today's date and the results are pending.  He does have a history of hypertension but reports that he does not check blood pressure regularly.  It is noted to be elevated on today's visit.  He is a former smoker.  Other risk factors include hyperlipidemia, obesity and obstructive sleep apnea.  He has a history of pulmonary embolism and has been on Eliquis  since.  This was diagnosed in 2019.  He has recently lost 25 pounds primarily through a low-carb and low-fat diet.  He does not report anything new in terms of symptoms.  His last echocardiogram was done in 2022 and had no significant aortic regurgitation or stenosis at that time.    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   Diverticulitis    pt denies   Hamstring muscle strain 2024   Left Leg   Hyperlipidemia    Hypertension    Mild ascending aorta dilatation (HCC)    4.8cm in diamter per CT chest, see imaging in epic dated 02-25-18   OSA (obstructive sleep apnea)    Pulmonary air embolism (HCC) 02/2018   lifetime eliquis  since then    Sleep apnea    CPAP    Past Surgical History:  Procedure Laterality Date   BACK SURGERY     COLONOSCOPY     REFRACTIVE SURGERY Right 07/25/2022   THORACENTESIS  08/2006   left pleural effusion   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   Procedure: RIGHT TOTAL HIP ARTHROPLASTY ANTERIOR APPROACH;  Surgeon: Liliane Rei, MD;  Location: [facility];  Service: Orthopedics;  Laterality: Right;      Social History   Occupational  History   Occupation: retired- part Restaurant manager, fast food  Tobacco Use   Smoking status: Former    Current packs/day: 0.00    Average packs/day: 1 pack/day for 35.0 years (35.0 ttl pk-yrs)    Types: Cigarettes    Start date: 12/15/1980    Quit date: 12/16/2015    Years since quitting: 8.3    Passive exposure: Never   Smokeless tobacco: Never  Vaping Use   Vaping status: Never Used  Substance and Sexual Activity   Alcohol use: Yes    Alcohol/week: 0.0 standard drinks of alcohol    Comment: occ   Drug use: No   Sexual activity: Not on file        Current Outpatient Medications on File Prior to Visit  Medication Sig Dispense Refill   albuterol  (VENTOLIN  HFA) 108 (90 Base) MCG/ACT inhaler Inhale 2 puffs into the lungs every 6 (six) hours as needed for wheezing or shortness of breath. 8 g 0   Ascorbic Acid (VITAMIN C) 1000 MG tablet Take 1,000 mg by mouth daily.     atorvastatin  (LIPITOR) 40 MG tablet Take 1 tablet (40 mg total) by mouth daily. 90 tablet 3   azithromycin  (  ZITHROMAX ) 250 MG tablet 2 tab po x 1 day then 1 tab po daily 6 tablet 0   ELIQUIS  5 MG TABS tablet TAKE ONE (1) TABLET BY MOUTH TWO TIMES PER DAY 180 tablet 3   guaiFENesin -codeine  100-10 MG/5ML syrup Take 5-10 mLs by mouth at bedtime. 100 mL 0   hydrochlorothiazide  (HYDRODIURIL ) 12.5 MG tablet TAKE 1 TABLET BY MOUTH ONCE DAILY 90 tablet 3   losartan  (COZAAR ) 100 MG tablet TAKE 1 TABLET BY MOUTH DAILY 90 tablet 0   metoprolol  succinate (TOPROL -XL) 25 MG 24 hr tablet TAKE ONE TABLET BY MOUTH EVERY DAY 90 tablet 0   predniSONE  (DELTASONE ) 20 MG tablet 3 tabs by mouth daily x 3 days, then 2 tabs by mouth daily x 2 days then 1 tab by mouth daily x 2 days 15 tablet 0   VITAMIN D PO Take by mouth.     [DISCONTINUED] lisinopril -hydrochlorothiazide  (PRINZIDE ,ZESTORETIC ) 10-12.5 MG per tablet Take 1 tablet by mouth daily. 30 tablet 11   No current facility-administered medications on file prior to visit.     BP (!)  157/82   Pulse (!) 56   Resp 20   Ht 6\' 3"  (1.905 m)   Wt (!) 313 lb (142 kg)   SpO2 96% Comment: RA  BMI 39.12 kg/m   Physical Exam Constitutional:      General: He is not in acute distress.    Appearance: He is obese. He is not ill-appearing.  HENT:     Head: Normocephalic and atraumatic.  Eyes:     General: No scleral icterus.    Conjunctiva/sclera: Conjunctivae normal.  Neck:     Vascular: No carotid bruit.  Cardiovascular:     Rate and Rhythm: Normal rate and regular rhythm.     Heart sounds: No murmur heard. Pulmonary:     Effort: Pulmonary effort is normal.     Breath sounds: Normal breath sounds.  Abdominal:     Palpations: Abdomen is soft.     Tenderness: There is no abdominal tenderness.  Musculoskeletal:     Right lower leg: Edema present.     Left lower leg: Edema present.  Skin:    General: Skin is warm.     Capillary Refill: Capillary refill takes less than 2 seconds.     Coloration: Skin is not jaundiced or pale.  Neurological:     Mental Status: He is alert and oriented to person, place, and time.  Psychiatric:        Thought Content: Thought content normal.     CTA Results: Pending, to my reading aneurysm does appear larger than the 5.0 cm range.  Will check radiologist report to confirm when available.      A/P: Enlarging ascending thoracic aortic aneurysm.  Most recent scan done on today's date with radiologist report pending but does appear larger to my reading.  His blood pressure is elevated on today still encouraged him to obtain a new home monitor and keep a blood pressure diary.  If persistently elevated readings he will need to discuss with primary care adjustments to current regimen.  He does understand the importance of blood pressure control as it relates to the aneurysm process.  We will repeat scan in 6 months.      Risk Modification: Discussed the importance of activity, healthy nutrition practices and overall improving  lifestyle.  I did complement him on his weight loss and he also discussed lowering salt intake to no more than 4 g  daily.  Statin:  yes  Smoking cessation instruction/counseling given: Former smoker  Patient was counseled on importance of Blood Pressure Control.  Despite Medical intervention if the patient notices persistently elevated blood pressure readings.  They are instructed to contact their Primary Care Physician  Please avoid use of Fluoroquinolones as this can potentially increase your risk of Aortic Rupture and/or Dissection  Patient educated on signs and symptoms of Aortic Dissection, handout also provided in AVS  Lindi Revering, PA-C 04/04/24

## 2024-04-04 NOTE — Patient Instructions (Signed)
 As discussed good blood pressure control and the importance of active lifestyle in particular portance of nutrition and exercise.

## 2024-04-06 ENCOUNTER — Other Ambulatory Visit: Payer: Self-pay | Admitting: Cardiovascular Disease

## 2024-04-14 NOTE — Progress Notes (Signed)
 CARDIOLOGY CONSULT NOTE       Patient ID: Russell Garrett MRN: [id number] DOB/AGE: [birth year] 73 y.o.  Admit date: (Not on file) Referring Physician: Neila Garrett  Primary Physician: Russell Llanos, MD Primary Cardiologist: Russell Garrett   HPI:  73 y.o. referred by Dr Russell Garrett for aortic aneurysm and CAD risk. History of PE prior to COVID infection earlier this year.  OSA HLD and HTN.  He follows with Dr Russell Garrett [facility] for his anuerysm.  CTA done 05/16/21 ascending aorta 4.7 cm  Noted some calcifications in LAD  Lung parenchyma with only mild linear atelectasis /scarring right lung base Echo done 02/26/18 showed EF 55-60% no LVH and tri leaflet AV He had a normal ETT 08/01/15   His PE was diagnosed in 02/25/18 with small burden in left lower lobe with no RV dilatation or hepatic reflux Hct is stable 43.6 LDL 103 He did have recurrent pneumonia's in 2008 and saw Dr Russell Garrett which probably explains the RLL scarring 35 pack year history of smoking quit in 2017   ARB increased for HTN earlier this year  On chronic eliquis  for PE On statin for HLD  Remarried last 11 years to high school friend He is overweight Likes to hunt and does walk his dog regularly   TTE done 08/29/21 EF 60-65% mild MR Ao 41 mm  Myovue 08/29/21 normal no ischemia EF 55%  PFT;s 09/16/21 severe restriction high DLCO no bronchodilator response  Has been seeing  pulmonary for above and OSA/CPAP.    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   Diverticulitis    pt denies   Hamstring  muscle strain 2024   Left Leg   Hyperlipidemia    Hypertension    Mild ascending aorta dilatation (HCC)    4.8cm in diamter per CT chest, see imaging in epic dated 02-25-18   OSA (obstructive sleep apnea)    Pulmonary air embolism (HCC) 02/2018   lifetime eliquis  since then    Sleep apnea    CPAP    Family History  Problem Relation Age of Onset   Hypertension Mother    Heart disease Mother    Kidney disease Mother    Heart disease Father    Stroke Father    Prostate cancer Father    Kidney disease Father    Heart disease Brother        cardioversion for arrhythmia   Diabetes Neg Hx    Colon cancer Neg Hx     Social History   Socioeconomic History   Marital status: Married    Spouse name: Not on file   Number of children: 2   Years of education: Not on file   Highest education level: Not on file  Occupational History   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  Tobacco Use   Smoking status: Former    Current packs/day: 0.00    Average packs/day: 1 pack/day for 35.0 years (35.0 ttl pk-yrs)  Types: Cigarettes    Start date: 12/15/1980    Quit date: 12/16/2015    Years since quitting: 8.3    Passive exposure: Never   Smokeless tobacco: Never  Vaping Use   Vaping status: Never Used  Substance and Sexual Activity   Alcohol use: Yes    Alcohol/week: 0.0 standard drinks of alcohol    Comment: occ   Drug use: No   Sexual activity: Not on file  Other Topics Concern   Not on file  Social History Narrative   Widowed   Remarried June 2014      Has living will   Wife is health care POA--- alternate is daughter, Russell Garrett   Would accept resuscitation    Probably no tube feeds if cognitively unaware.   Social Drivers of Corporate investment banker Strain: Not on file  Food Insecurity: Not on file  Transportation Needs: Not on file  Physical Activity: Not on file  Stress: Not on file  Social Connections: Not on file  Intimate Partner Violence: Not on file     Past Surgical History:  Procedure Laterality Date   BACK SURGERY     COLONOSCOPY     REFRACTIVE SURGERY Right 07/25/2022   THORACENTESIS  08/2006   left pleural effusion   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   Procedure: RIGHT TOTAL HIP ARTHROPLASTY ANTERIOR APPROACH;  Surgeon: Russell Rei, MD;  Location: [facility];  Service: Orthopedics;  Laterality: Right;       Current Outpatient Medications:    Ascorbic Acid (VITAMIN C) 1000 MG tablet, Take 1,000 mg by mouth daily., Disp: , Rfl:    atorvastatin  (LIPITOR) 40 MG tablet, Take 1 tablet (40 mg total) by mouth daily., Disp: 90 tablet, Rfl: 3   ELIQUIS  5 MG TABS tablet, TAKE ONE (1) TABLET BY MOUTH TWO TIMES PER DAY, Disp: 180 tablet, Rfl: 3   hydrochlorothiazide  (HYDRODIURIL ) 12.5 MG tablet, Take 1 tablet (12.5 mg total) by mouth daily., Disp: 90 tablet, Rfl: 3   losartan  (COZAAR ) 100 MG tablet, Take 1 tablet (100 mg total) by mouth daily., Disp: 90 tablet, Rfl: 3   metoprolol  succinate (TOPROL -XL) 25 MG 24 hr tablet, TAKE ONE TABLET BY MOUTH EVERY DAY, Disp: 90 tablet, Rfl: 0    Physical Exam: Blood pressure 130/78, pulse (!) 59, height 6\' 3"  (1.905 m), weight (!) 310 lb (140.6 kg), SpO2 95%.    Affect appropriate Healthy:  appears stated age HEENT: normal Neck supple with no adenopathy JVP normal no bruits no thyromegaly Lungs clear with no wheezing and good diaphragmatic motion Heart:  S1/S2 no murmur, no rub, gallop or click PMI normal Abdomen: benighn, BS positve, no tenderness, no AAA no bruit.  No HSM or HJR Distal pulses intact with no bruits No edema Neuro non-focal Skin warm and dry No muscular weakness   Labs:   Lab Results  Component Value Date   WBC 4.5 06/17/2023   HGB 14.7 06/17/2023   HCT 44.1 06/17/2023   MCV 94.0 06/17/2023   PLT 154.0 06/17/2023   No results for input(s): "NA", "K", "CL", "CO2", "BUN", "CREATININE", "CALCIUM ", "PROT", "BILITOT", "ALKPHOS", "ALT", "AST", "GLUCOSE" in the  last 168 hours.  Invalid input(s): "LABALBU" Lab Results  Component Value Date   TROPONINI <0.03 02/25/2018    Lab Results  Component Value Date   CHOL 180 06/17/2023   CHOL 176 06/11/2022   CHOL 176 04/26/2021   Lab Results  Component Value Date   HDL 50.00  06/17/2023   HDL 54.30 06/11/2022   HDL 57.40 04/26/2021   Lab Results  Component Value Date   LDLCALC 109 (H) 06/17/2023   LDLCALC 101 (H) 06/11/2022   LDLCALC 103 (H) 04/26/2021   Lab Results  Component Value Date   TRIG 108.0 06/17/2023   TRIG 104.0 06/11/2022   TRIG 75.0 04/26/2021   Lab Results  Component Value Date   CHOLHDL 4 06/17/2023   CHOLHDL 3 06/11/2022   CHOLHDL 3 04/26/2021   Lab Results  Component Value Date   LDLDIRECT 161.3 02/03/2011   LDLDIRECT 149.5 02/12/2010   LDLDIRECT 180.9 01/26/2008      Radiology: CT ANGIO CHEST AORTA W/CM & OR WO/CM Result Date: 04/04/2024 CLINICAL DATA:  Follow-up ascending raortic aneurysm. EXAM: CT ANGIOGRAPHY CHEST WITH CONTRAST TECHNIQUE: Multidetector CT imaging of the chest was performed using the standard protocol during bolus administration of intravenous contrast. Multiplanar CT image reconstructions and MIPs were obtained to evaluate the vascular anatomy. RADIATION DOSE REDUCTION: This exam was performed according to the departmental dose-optimization program which includes automated exposure control, adjustment of the mA and/or kV according to patient size and/or use of iterative reconstruction technique. CONTRAST:  75mL ISOVUE -370 IOPAMIDOL  (ISOVUE -370) INJECTION 76% COMPARISON:  CT 10/13/2023 FINDINGS: Cardiovascular: Dilated ascending aorta, maximal dimension 4.7 cm, unchanged from prior exam. The aorta at the sinuses of Valsalva measures 4.3 cm, at the sinotubular junction 3.7 cm the upper descending aorta is mildly dilated at 3.2 cm. Moderate diffuse aortic atherosclerosis. Common origin of brachiocephalic in the common carotid artery. No dissection or  acute aortic findings. The heart is normal in size. There is no pericardial effusion. Coronary artery calcifications are seen. The intraluminal web within the right lower lobe pulmonary artery is not as well evaluated on the current exam due to contrast bolus timing, faintly visualized. No obvious central pulmonary embolus on this exam not tailored to pulmonary artery assessment. Mediastinum/Nodes: No mediastinal or hilar adenopathy. Unremarkable appearance of the thyroid gland. The esophagus is decompressed. Lungs/Pleura: Elevation of right hemidiaphragm with adjacent scarring. Benign calcified granuloma in the right upper lobe. No acute airspace disease. No pleural fluid. Trachea and central airways are clear. Upper Abdomen: No acute findings. Cyst in the right upper kidney. No further follow-up imaging is recommended. Musculoskeletal: There are no acute or suspicious osseous abnormalities. Mild diffuse thoracic spondylosis. Review of the MIP images confirms the above findings. IMPRESSION: 1. Dilated ascending aorta, maximal dimension 4.7 cm, unchanged from prior exam. Recommend semi-annual imaging followup by CTA or MRA and referral to cardiothoracic surgery if not already obtained. This recommendation follows 2010 ACCF/AHA/AATS/ACR/ASA/SCA/SCAI/SIR/STS/SVM Guidelines for the Diagnosis and Management of Patients With Thoracic Aortic Disease. Circulation. 2010; 121: Z610-R604. Aortic aneurysm NOS (ICD10-I71.9) 2. The intraluminal web within the right lower lobe pulmonary artery is not as well evaluated on the current exam due to contrast bolus timing, faintly visualized. No obvious central pulmonary embolus on this exam not tailored to pulmonary artery assessment. 3. Elevation of right hemidiaphragm with adjacent scarring. 4. Coronary artery calcifications. Aortic Atherosclerosis (ICD10-I70.0). Electronically Signed   By: Chadwick Colonel M.D.   On: 04/04/2024 15:36    EKG: SR rate 83 LAD 11/28/20 04/28/2024 SR  rate 58 QRS 124 msec IVCD slight    ASSESSMENT AND PLAN:   Dyspnea:  likely combination of previous smoking, pneumonia, scarring, COVID and history of PE. Refered to pulmonary for restrictive lung dx moderate documented on PFTls  Sees Dr Dione Franks CAD:  calcification of LAD on  CT normal ETT 2016 and normal  myovue 09/12/21 HLD:  on lipitor 40 mg LDL 109 HTN:  Well controlled.  Continue current medications and low sodium Dash type diet.   Aortic aneurysm:  f/u CVTS Dr Russell Garrett  CTA 04/04/24  stable 4.7 cm  OSA:  continue CPAP use discussed weight loss   F/U pulmonary F/U [facility]  F/U in a year    Signed: Janelle Mediate 04/28/2024, 9:12 AM

## 2024-04-28 ENCOUNTER — Ambulatory Visit: Payer: PPO | Attending: Cardiovascular Disease | Admitting: Cardiovascular Disease

## 2024-04-28 ENCOUNTER — Encounter: Payer: Self-pay | Admitting: Cardiovascular Disease

## 2024-04-28 VITALS — BP 130/78 | HR 59 | Ht 75.0 in | Wt 310.0 lb

## 2024-04-28 DIAGNOSIS — I712 Thoracic aortic aneurysm, without rupture, unspecified: Secondary | ICD-10-CM | POA: Diagnosis not present

## 2024-04-28 DIAGNOSIS — I700 Atherosclerosis of aorta: Secondary | ICD-10-CM

## 2024-04-28 DIAGNOSIS — I10 Essential (primary) hypertension: Secondary | ICD-10-CM | POA: Diagnosis not present

## 2024-04-28 MED ORDER — LOSARTAN POTASSIUM 100 MG PO TABS: 100.0000 mg | ORAL_TABLET | Freq: Every day | ORAL | 3 refills | Status: AC

## 2024-04-28 MED ORDER — HYDROCHLOROTHIAZIDE 12.5 MG PO TABS: 12.5000 mg | ORAL_TABLET | Freq: Every day | ORAL | 3 refills | Status: AC

## 2024-04-28 NOTE — Patient Instructions (Addendum)
 Medication Instructions:  Your physician recommends that you continue on your current medications as directed. Please refer to the Current Medication list given to you today.  *If you need a refill on your cardiac medications before your next appointment, please call your pharmacy*  Lab Work: If you have labs (blood work) drawn today and your tests are completely normal, you will receive your results only by: MyChart Message (if you have MyChart) OR A paper copy in the mail If you have any lab test that is abnormal or we need to change your treatment, we will call you to review the results.  Testing/Procedures: None ordered today.  Follow-Up: At [facility], you and your health needs are our priority.  As part of our continuing mission to provide you with exceptional heart care, our providers are all part of one team.  This team includes your primary Cardiologist (physician) and Advanced Practice Providers or APPs (Physician Assistants and Nurse Practitioners) who all work together to provide you with the care you need, when you need it.  Your next appointment:   1 year(s)  Provider:   Janelle Mediate, MD    We recommend signing up for the patient portal called "MyChart".  Sign up information is provided on this After Visit Summary.  MyChart is used to connect with patients for Virtual Visits (Telemedicine).  Patients are able to view lab/test results, encounter notes, upcoming appointments, etc.  Non-urgent messages can be sent to your provider as well.   To learn more about what you can do with MyChart, go to [email or web address].   You have been referred to Medical Weight Management.

## 2024-05-10 ENCOUNTER — Other Ambulatory Visit: Payer: Self-pay | Admitting: Cardiovascular Disease

## 2024-05-24 ENCOUNTER — Ambulatory Visit (INDEPENDENT_AMBULATORY_CARE_PROVIDER_SITE_OTHER)

## 2024-05-24 VITALS — BP 130/78 | Ht 75.0 in | Wt 305.0 lb

## 2024-05-24 DIAGNOSIS — Z0000 Encounter for general adult medical examination without abnormal findings: Secondary | ICD-10-CM | POA: Diagnosis not present

## 2024-05-24 DIAGNOSIS — Z5320 Procedure and treatment not carried out because of patient's decision for unspecified reasons: Secondary | ICD-10-CM

## 2024-05-24 NOTE — Patient Instructions (Signed)
 Russell Garrett , Thank you for taking time out of your busy schedule to complete your Annual Wellness Visit with me. I enjoyed our conversation and look forward to speaking with you again next year. I, as well as your care team,  appreciate your ongoing commitment to your health goals. Please review the following plan we discussed and let me know if I can assist you in the future. Your Game plan/ To Do List    Referrals: If you haven't heard from the office you've been referred to, please reach out to them at the phone provided.  none Follow up Visits: Next Medicare AWV with our clinical staff: 05/25/2025   Have you seen your provider in the last 6 months (3 months if uncontrolled diabetes)? No Next Office Visit with your provider: 06/20/2024  Clinician Recommendations:  Aim for 30 minutes of exercise or brisk walking, 6-8 glasses of water , and 5 servings of fruits and vegetables each day.       This is a list of the screening recommended for you and due dates:  Health Maintenance  Topic Date Due   Flu Shot  06/24/2024   Screening for Lung Cancer  04/04/2025   Medicare Annual Wellness Visit  05/24/2025   Colon Cancer Screening  04/29/2027   DTaP/Tdap/Td vaccine (5 - Td or Tdap) 03/19/2028   Pneumococcal Vaccine for age over 40  Completed   Hepatitis C Screening  Completed   Zoster (Shingles) Vaccine  Completed   Hepatitis B Vaccine  Aged Out   HPV Vaccine  Aged Out   Meningitis B Vaccine  Aged Out   COVID-19 Vaccine  Discontinued    Advanced directives: (In Chart) A copy of your advanced directives are scanned into your chart should your provider ever need it. Advance Care Planning is important because it:  [x]  Makes sure you receive the medical care that is consistent with your values, goals, and preferences  [x]  It provides guidance to your family and loved ones and reduces their decisional burden about whether or not they are making the right decisions based on your wishes.  Follow  the link provided in your after visit summary or read over the paperwork we have mailed to you to help you started getting your Advance Directives in place. If you need assistance in completing these, please reach out to us  so that we can help you! See attachments for Preventive Care and Fall Prevention Tips.

## 2024-05-24 NOTE — Progress Notes (Signed)
 Because this visit was a virtual/telehealth visit,  certain criteria was not obtained, such a blood pressure, CBG if applicable, and timed get up and go. Any medications not marked as taking were not mentioned during the medication reconciliation part of the visit. Any vitals not documented were not able to be obtained due to this being a telehealth visit or patient was unable to self-report a recent blood pressure reading due to a lack of equipment at home via telehealth. Vitals that have been documented are verbally provided by the patient.   This visit was performed by a medical professional under my direct supervision. I was immediately available for consultation/collaboration. I have reviewed and agree with the Annual Wellness Visit documentation.   Subjective:   Russell Garrett is a 73 y.o. who presents for a Medicare Wellness preventive visit.  As a reminder, Annual Wellness Visits don't include a physical exam, and some assessments may be limited, especially if this visit is performed virtually. We may recommend an in-person follow-up visit with your provider if needed.  Visit Complete: Virtual I connected with  Russell Garrett on 05/24/24 by a audio enabled telemedicine application and verified that I am speaking with the correct person using two identifiers.  Patient Location: Home  Provider Location: Home Office  I discussed the limitations of evaluation and management by telemedicine. The patient expressed understanding and agreed to proceed.  Vital Signs: Because this visit was a virtual/telehealth visit, some criteria may be missing or patient reported. Any vitals not documented were not able to be obtained and vitals that have been documented are patient reported.  VideoDeclined- This patient declined Librarian, academic. Therefore the visit was completed with audio only.  Persons Participating in Visit: Patient.  AWV Questionnaire: Yes: Patient  Medicare AWV questionnaire was completed by the patient on 05/20/2024; I have confirmed that all information answered by patient is correct and no changes since this date.  Cardiac Risk Factors include: advanced age (>27men, >78 women);obesity (BMI >30kg/m2);male gender;hypertension     Objective:    Today's Vitals   05/24/24 0937  BP: 130/78  Weight: (!) 305 lb (138.3 kg)  Height: 6' 3 (1.905 m)   Body mass index is 38.12 kg/m.     05/24/2024    9:40 AM 11/28/2020   12:46 PM 09/27/2018    7:30 PM 09/17/2018    2:57 PM 02/25/2018    5:05 PM 02/25/2018    9:25 AM 04/28/2017    7:06 AM  Advanced Directives  Does Patient Have a Medical Advance Directive? Yes No Yes  Yes  No  No  Yes   Type of Estate agent of [place];Living will  Living will Living will   Healthcare Power of [place];Living will  Does patient want to make changes to medical advance directive? No - Patient declined  No - Patient declined  No - Patient declined      Copy of Healthcare Power of Attorney in Chart? Yes - validated most recent copy scanned in chart (See row information)        Would patient like information on creating a medical advance directive?     No - Patient declined  No - Patient declined       Data saved with a previous flowsheet row definition    Current Medications (verified) Outpatient Encounter Medications as of 05/24/2024  Medication Sig   Ascorbic Acid (VITAMIN C) 1000 MG tablet Take 1,000 mg by mouth daily.  atorvastatin  (LIPITOR) 40 MG tablet Take 1 tablet (40 mg total) by mouth daily.   ELIQUIS  5 MG TABS tablet TAKE ONE (1) TABLET BY MOUTH TWO TIMES PER DAY   hydrochlorothiazide  (HYDRODIURIL ) 12.5 MG tablet Take 1 tablet (12.5 mg total) by mouth daily.   losartan  (COZAAR ) 100 MG tablet Take 1 tablet (100 mg total) by mouth daily.   metoprolol  succinate (TOPROL -XL) 25 MG 24 hr tablet TAKE 1 TABLET BY MOUTH ONCE DAILY MUST KEEP APPOINTMENT 04/28/24 FOR MORE REFILLS    [DISCONTINUED] lisinopril -hydrochlorothiazide  (PRINZIDE ,ZESTORETIC ) 10-12.5 MG per tablet Take 1 tablet by mouth daily.   No facility-administered encounter medications on file as of 05/24/2024.    Allergies (verified) Patient has no known allergies.   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   Diverticulitis    pt denies   Hamstring muscle strain 2024   Left Leg   Hyperlipidemia    Hypertension    Mild ascending aorta dilatation (HCC)    4.8cm in diamter per CT chest, see imaging in epic dated 02-25-18   OSA (obstructive sleep apnea)    Pulmonary air embolism (HCC) 02/2018   lifetime eliquis  since then    Sleep apnea    CPAP   Past Surgical History:  Procedure Laterality Date   BACK SURGERY     COLONOSCOPY     REFRACTIVE SURGERY Right 07/25/2022   THORACENTESIS  08/2006   left pleural effusion   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   Procedure: RIGHT TOTAL HIP ARTHROPLASTY ANTERIOR APPROACH;  Surgeon: Melodi Lerner, MD;  Location: [facility];  Service: Orthopedics;  Laterality: Right;    Family History  Problem Relation Age of Onset   Hypertension Mother    Heart disease Mother    Kidney disease Mother    Heart disease Father    Stroke Father    Prostate cancer Father    Kidney disease Father    Heart disease Brother        cardioversion for arrhythmia   Diabetes Neg Hx    Colon cancer Neg Hx    Social History   Socioeconomic History   Marital status: Married    Spouse name: Not on file   Number of children: 2   Years of education: Not on file   Highest education level: 12th grade  Occupational History   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  Tobacco Use   Smoking status: Former    Current packs/day: 0.00    Average packs/day: 1 pack/day for 35.0 years (35.0 ttl pk-yrs)    Types: Cigarettes    Start date: 12/15/1980    Quit date: 12/16/2015    Years  since quitting: 8.4    Passive exposure: Never   Smokeless tobacco: Never  Vaping Use   Vaping status: Never Used  Substance and Sexual Activity   Alcohol use: Yes    Alcohol/week: 0.0 standard drinks of alcohol    Comment: occ   Drug use: No   Sexual activity: Not on file  Other Topics Concern   Not on file  Social History Narrative   Widowed   Remarried June 2014      Has living will   Wife is health care POA--- alternate is daughter, Sonny   Would accept resuscitation    Probably no tube feeds if cognitively unaware.   Social Drivers  of Health   Financial Resource Strain: Low Risk  (05/20/2024)   Overall Financial Resource Strain (CARDIA)    Difficulty of Paying Living Expenses: Not very hard  Food Insecurity: No Food Insecurity (05/20/2024)   Hunger Vital Sign    Worried About Running Out of Food in the Last Year: Never true    Ran Out of Food in the Last Year: Never true  Transportation Needs: No Transportation Needs (05/20/2024)   PRAPARE - Administrator, Civil Service (Medical): No    Lack of Transportation (Non-Medical): No  Physical Activity: Insufficiently Active (05/20/2024)   Exercise Vital Sign    Days of Exercise per Week: 2 days    Minutes of Exercise per Session: 20 min  Stress: No Stress Concern Present (05/20/2024)   [employer] of Occupational Health - Occupational Stress Questionnaire    Feeling of Stress: Not at all  Social Connections: Socially Integrated (05/20/2024)   Social Connection and Isolation Panel    Frequency of Communication with Friends and Family: Three times a week    Frequency of Social Gatherings with Friends and Family: Once a week    Attends Religious Services: More than 4 times per year    Active Member of [employer] or Organizations: Yes    Attends Engineer, structural: More than 4 times per year    Marital Status: Married    Tobacco Counseling Counseling given: Not Answered    Clinical  Intake:  Pre-visit preparation completed: Yes  Pain : No/denies pain     BMI - recorded: 38.12 Nutritional Status: BMI > 30  Obese Nutritional Risks: None Diabetes: No  No results found for: HGBA1C   How often do you need to have someone help you when you read instructions, pamphlets, or other written materials from your doctor or pharmacy?: 1 - Never What is the last grade level you completed in school?: some college  Interpreter Needed?: No  Information entered by :: Tilford Deaton,cma   Activities of Daily Living     05/20/2024    9:30 AM  In your present state of health, do you have any difficulty performing the following activities:  Hearing? 0  Vision? 0  Difficulty concentrating or making decisions? 0  Walking or climbing stairs? 0  Dressing or bathing? 0  Doing errands, shopping? 0  Preparing Food and eating ? N  Using the Toilet? N  In the past six months, have you accidently leaked urine? N  Do you have problems with loss of bowel control? N  Managing your Medications? N  Managing your Finances? N  Housekeeping or managing your Housekeeping? N    Patient Care Team: Jimmy Charlie FERNS, MD as PCP - General Delford Maude BROCKS, MD as PCP - Cardiology (Cardiology)  I have updated your Care Teams any recent Medical Services you may have received from other providers in the past year.     Assessment:   This is a routine wellness examination for Jakarie.  Hearing/Vision screen Hearing Screening - Comments:: No hearing issues Vision Screening - Comments:: Patient wears glasses   Goals Addressed               This Visit's Progress     patient (pt-stated)        lose some weight        Depression Screen     05/24/2024    9:40 AM 06/17/2023    8:32 AM 06/17/2023    7:57 AM 06/11/2022  8:46 AM 04/26/2021    8:42 AM 04/24/2020    9:05 AM 04/19/2019   11:55 AM  PHQ 2/9 Scores  PHQ - 2 Score 0 0 0 0 1 0 0  PHQ- 9 Score 0          Fall Risk      05/20/2024    9:30 AM 06/17/2023    8:32 AM 06/17/2023    7:57 AM 06/11/2022    8:46 AM 04/26/2021    8:42 AM  Fall Risk   Falls in the past year? 0 0 0 0 1  Number falls in past yr: 0  0  0  Injury with Fall? 0  0  1  Risk for fall due to : No Fall Risks  No Fall Risks    Follow up Falls evaluation completed  Falls evaluation completed  Falls prevention discussed      Data saved with a previous flowsheet row definition    MEDICARE RISK AT HOME:  Medicare Risk at Home Any stairs in or around the home?: (Patient-Rptd) Yes If so, are there any without handrails?: (Patient-Rptd) No Home free of loose throw rugs in walkways, pet beds, electrical cords, etc?: (Patient-Rptd) Yes Adequate lighting in your home to reduce risk of falls?: (Patient-Rptd) Yes Life alert?: (Patient-Rptd) No Use of a cane, walker or w/c?: (Patient-Rptd) No Grab bars in the bathroom?: (Patient-Rptd) No Shower chair or bench in shower?: (Patient-Rptd) No Elevated toilet seat or a handicapped toilet?: (Patient-Rptd) No  TIMED UP AND GO:  Was the test performed?  No  Cognitive Function: 6CIT completed        05/24/2024    9:39 AM  6CIT Screen  What Year? 0 points  What month? 0 points  What time? 0 points  Count back from 20 0 points  Months in reverse 0 points  Repeat phrase 0 points  Total Score 0 points    Immunizations Immunization History  Administered Date(s) Administered   Fluad Quad(high Dose 65+) 07/27/2019, 08/16/2020, 08/28/2021, 10/03/2022   Fluad Trivalent(High Dose 65+) 10/08/2023   H1N1 02/09/2009   Influenza Split 08/06/2011, 08/18/2012, 10/09/2015   Influenza Whole 08/16/2010   Influenza,inj,Quad PF,6+ Mos 09/20/2013, 10/04/2016, 09/25/2018   Influenza,inj,quad, With Preservative 11/23/2017   Influenza-Unspecified 09/24/2016   PFIZER(Purple Top)SARS-COV-2 Vaccination 01/07/2020, 01/31/2020, 11/22/2020   Pneumococcal Conjugate-13 03/17/2017   Pneumococcal Polysaccharide-23  03/19/2018   Pneumococcal-Unspecified 02/22/2013   Td 12/06/1996, 01/25/2008   Tdap 03/19/2018   Tetanus 11/24/2016   Zoster Recombinant(Shingrix) 05/02/2021, 07/22/2021   Zoster, Live 03/11/2013    Screening Tests Health Maintenance  Topic Date Due   INFLUENZA VACCINE  06/24/2024   Lung Cancer Screening  04/04/2025   Medicare Annual Wellness (AWV)  05/24/2025   Colonoscopy  04/29/2027   DTaP/Tdap/Td (5 - Td or Tdap) 03/19/2028   Pneumococcal Vaccine: 50+ Years  Completed   Hepatitis C Screening  Completed   Zoster Vaccines- Shingrix  Completed   Hepatitis B Vaccines  Aged Out   HPV VACCINES  Aged Out   Meningococcal B Vaccine  Aged Out   COVID-19 Vaccine  Discontinued    Health Maintenance  There are no preventive care reminders to display for this patient. Health Maintenance Items Addressed:   Additional Screening:  Vision Screening: Recommended annual ophthalmology exams for early detection of glaucoma and other disorders of the eye. Would you like a referral to an eye doctor? No    Dental Screening: Recommended annual dental exams for proper oral hygiene  Community Resource Referral / Chronic Care Management: CRR required this visit?  No   CCM required this visit?  No   Plan:    I have personally reviewed and noted the following in the patient's chart:   Medical and social history Use of alcohol, tobacco or illicit drugs  Current medications and supplements including opioid prescriptions. Patient is not currently taking opioid prescriptions. Functional ability and status Nutritional status Physical activity Advanced directives List of other physicians Hospitalizations, surgeries, and ER visits in previous 12 months Vitals Screenings to include cognitive, depression, and falls Referrals and appointments  In addition, I have reviewed and discussed with patient certain preventive protocols, quality metrics, and best practice recommendations. A written  personalized care plan for preventive services as well as general preventive health recommendations were provided to patient.   Lyle MARLA Right, [place]   05/24/2024   After Visit Summary: (MyChart) Due to this being a telephonic visit, the after visit summary with patients personalized plan was offered to patient via MyChart   Notes: Nothing significant to report at this time.

## 2024-06-14 ENCOUNTER — Encounter (INDEPENDENT_AMBULATORY_CARE_PROVIDER_SITE_OTHER): Payer: Self-pay | Admitting: Internal Medicine

## 2024-06-14 ENCOUNTER — Ambulatory Visit (INDEPENDENT_AMBULATORY_CARE_PROVIDER_SITE_OTHER): Admitting: Internal Medicine

## 2024-06-14 VITALS — BP 135/86 | HR 62 | Temp 97.6°F | Ht 72.0 in | Wt 309.0 lb

## 2024-06-14 DIAGNOSIS — I700 Atherosclerosis of aorta: Secondary | ICD-10-CM

## 2024-06-14 DIAGNOSIS — Z6838 Body mass index (BMI) 38.0-38.9, adult: Secondary | ICD-10-CM | POA: Diagnosis not present

## 2024-06-14 DIAGNOSIS — I82409 Acute embolism and thrombosis of unspecified deep veins of unspecified lower extremity: Secondary | ICD-10-CM | POA: Diagnosis not present

## 2024-06-14 DIAGNOSIS — E7800 Pure hypercholesterolemia, unspecified: Secondary | ICD-10-CM

## 2024-06-14 DIAGNOSIS — E66812 Obesity, class 2: Secondary | ICD-10-CM | POA: Diagnosis not present

## 2024-06-14 DIAGNOSIS — Z0289 Encounter for other administrative examinations: Secondary | ICD-10-CM

## 2024-06-14 DIAGNOSIS — I10 Essential (primary) hypertension: Secondary | ICD-10-CM | POA: Diagnosis not present

## 2024-06-14 DIAGNOSIS — I7121 Aneurysm of the ascending aorta, without rupture: Secondary | ICD-10-CM | POA: Diagnosis not present

## 2024-06-14 DIAGNOSIS — Z87891 Personal history of nicotine dependence: Secondary | ICD-10-CM

## 2024-06-14 DIAGNOSIS — G4733 Obstructive sleep apnea (adult) (pediatric): Secondary | ICD-10-CM

## 2024-06-14 NOTE — Assessment & Plan Note (Signed)
 Reviewed most recent CT angiogram.  His goal blood pressure should be less than 120 to reduce the risk of expansion he is already on beta-blockade and ARB.  Medications could be adjusted losing weight will improve blood pressure control.

## 2024-06-14 NOTE — Assessment & Plan Note (Signed)
 I cannot find most recent sleep study patient is also not aware of severity he reports being on PAP therapy for 20 years.  Losing 10 to 15% of body weight may reduce AHI.  He may also benefit from GLP-1 treatment

## 2024-06-14 NOTE — Assessment & Plan Note (Signed)
 His blood pressure is above goal, his blood pressure goal is less than 120 systolics if tolerated due to dilatation of ascending aorta.  He is currently on metoprolol , losartan  and hydrochlorothiazide .  Continue current regimen.  Losing 10% of body weight may improve blood pressure control

## 2024-06-14 NOTE — Progress Notes (Signed)
 [address] [place], [place], [place] [address] Office: [phone]  /  Fax: [phone]   Initial Consultation    Russell Garrett was seen in clinic today to evaluate for obesity. He is interested in losing weight to improve overall health and reduce the risk of weight related complications. He presents today to review program treatment options, initial physical assessment, and evaluation.    Anthropometrics and Bioimpedance Analysis   Body mass index is 41.91 kg/m. Body Fat Mass : 42.3 % Visceral Fat Mass Rating : 30 Waist to Height Ratio: TBD  Obesity Related Diseases and Complications  Obesity Quality of Life and Psychosocial Complications: Reduced health-related quality of life  Cardiometabolic: Dyslipidemia or hypercholesterolemia, Hypertension, and Venous thromboembolism  Biomechanical: Obstructive sleep apnea and on CPAP, sleep study done [facility]   Weight Related History  He was referred by: Specialist  When asked what they would like to accomplish? He states: Improve energy levels and physical activity, Improve existing medical conditions, and Improve quality of life  Weight history: Started to gain more weight last 4 years  Highest weight: 325  Contributing factors: family history of obesity, disruption of circadian rhythm / sleep disordered breathing, use of obesogenic medications: Beta-blockers, and reduced physical activity  Prior weight loss attempts: Optavia and lost 50-75 pounds over 6 months  Current or previous pharmacotherapy: None  Response to medication: Never tried medications  Current nutrition plan: None  Greatest challenge with dieting: cost.  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    General:  Alert, oriented and cooperative. Patient is in no acute distress.  Respiratory: Normal respiratory effort, no problems with respiration noted   Gait: able to ambulate independently  Mental Status: Normal mood and affect. Normal behavior. Normal judgment and thought content.   Diagnostic Data Reviewed  BMET    Component Value Date/Time   NA 140 06/17/2023 0906   K 4.1 06/17/2023 0906   CL 103 06/17/2023 0906   CO2 29 06/17/2023 0906   GLUCOSE 103 (H) 06/17/2023 0906   BUN 21 06/17/2023 0906   CREATININE 1.14 06/17/2023 0906   CALCIUM  9.7 06/17/2023 0906   GFRNONAA >60 11/28/2020 1246   GFRAA >60 09/28/2018 0419   No results found for: HGBA1C No results found for: INSULIN CBC    Component Value Date/Time   WBC 4.5 06/17/2023 0906   RBC 4.69 06/17/2023 0906   HGB 14.7 06/17/2023 0906   HCT 44.1 06/17/2023 0906   PLT 154.0  06/17/2023 0906   MCV 94.0 06/17/2023 0906   MCH 30.7 11/28/2020 1246   MCHC 33.3 06/17/2023 0906   RDW 13.9 06/17/2023 0906   Iron/TIBC/Ferritin/ %Sat No results found for: IRON, TIBC, FERRITIN, IRONPCTSAT Lipid Panel     Component Value Date/Time   CHOL 180 06/17/2023 0906   TRIG 108.0 06/17/2023 0906   HDL 50.00 06/17/2023 0906   CHOLHDL 4 06/17/2023 0906   VLDL 21.6 06/17/2023 0906   LDLCALC 109 (H) 06/17/2023 0906   LDLDIRECT 161.3 02/03/2011 1229   Hepatic Function Panel     Component Value Date/Time   PROT 7.4 06/17/2023  0906   ALBUMIN 4.3 06/17/2023 0906   AST 22 06/17/2023 0906   ALT 23 06/17/2023 0906   ALKPHOS 67 06/17/2023 0906   BILITOT 0.9 06/17/2023 0906   BILIDIR 0.2 03/02/2013 0929      Component Value Date/Time   TSH 1.72 03/03/2014 1232    Medications  Outpatient Encounter Medications as of 06/14/2024  Medication Sig   atorvastatin  (LIPITOR) 40 MG tablet Take 1 tablet (40 mg total) by mouth daily.   ELIQUIS  5 MG TABS tablet TAKE ONE (1) TABLET BY MOUTH TWO TIMES PER DAY   hydrochlorothiazide  (HYDRODIURIL ) 12.5 MG tablet Take 1 tablet (12.5 mg total) by mouth daily.   losartan  (COZAAR ) 100 MG tablet Take 1 tablet (100 mg total) by mouth daily.   metoprolol  succinate (TOPROL -XL) 25 MG 24 hr tablet TAKE 1 TABLET BY MOUTH ONCE DAILY MUST KEEP APPOINTMENT 04/28/24 FOR MORE REFILLS   Ascorbic Acid (VITAMIN C) 1000 MG tablet Take 1,000 mg by mouth daily.   [DISCONTINUED] lisinopril -hydrochlorothiazide  (PRINZIDE ,ZESTORETIC ) 10-12.5 MG per tablet Take 1 tablet by mouth daily.   No facility-administered encounter medications on file as of 06/14/2024.     Assessment and Plan   Assessment & Plan Essential hypertension, benign His blood pressure is above goal, his blood pressure goal is less than 120 systolics if tolerated due to dilatation of ascending aorta.  He is currently on metoprolol , losartan  and hydrochlorothiazide .  Continue current regimen.  Losing 10% of body weight may improve blood pressure control OBSTRUCTIVE SLEEP APNEA I cannot find most recent sleep study patient is also not aware of severity he reports being on PAP therapy for 20 years.  Losing 10 to 15% of body weight may reduce AHI.  He may also benefit from GLP-1 treatment Pure hypercholesterolemia He is currently on moderate intensity statin therapy.  Has an elevated cardiovascular risk The 10-year ASCVD risk score (Arnett DK, et al., 2019) is: 24.7%.  He also has presence of aortic atherosclerosis and coronary artery  calcifications on CT.  Continue with cardiovascular risk reduction.  He may benefit from GLP-1 treatment.  Class 2 severe obesity with serious comorbidity and body mass index (BMI) of 38.0 to 38.9 in adult, unspecified obesity type (HCC) Contributing factors: Family history, genetics, OSA We reviewed anthropometrics, biometrics, associated medical conditions and contributing factors with patient. he would benefit from a medically tailored reduced calorie nutrional plan based on his REE (resting energy expenditure), which will be determined by indirect calorimetry.  We will also assess for cardiometabolic risk and nutritional derangements via fasting labs at intake appointment.   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  He has a high degree of visceral adiposity which increase the risk of thromboembolic disease.  Losing weight may reduce the risk also the use of GLP-1.  Continue NOAC. Aortic atherosclerosis (HCC) On CT  angiogram he also has coronary artery calcifications.  He might benefit from high intensity statin therapy in addition to improve blood pressure control. 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  His goal blood pressure should be less than 120 to reduce the risk of expansion he is already on beta-blockade and ARB.  Medications could be adjusted losing weight will improve blood pressure control.      Obesity Treatment and Action Plan:  Patient will work on garnering support from family and friends to begin weight loss journey. Will work on eliminating or reducing the presence of highly palatable, calorie dense foods in the home. Will complete provided nutritional and psychosocial assessment questionnaire before the next appointment. Will be scheduled for indirect calorimetry to determine resting energy expenditure in a fasting state.  This will allow us   to create a reduced calorie, high-protein meal plan to promote loss of fat mass while preserving muscle mass. Counseled on the health benefits of losing 5%-15% of total body weight. Was counseled on nutritional approaches to weight loss and benefits of reducing processed foods and consuming plant-based foods and high quality protein as part of nutritional weight management. Was counseled on pharmacotherapy and role as an adjunct in weight management.   Education and Additional resources  He was weighed on the bioimpedance scale and results were discussed and documented in the synopsis.  We discussed obesity as a progressive, chronic disease and the importance of a more detailed evaluation of all the factors contributing to the disease.  We reviewed the basic principles in obesity management.   We discussed the importance of long term lifestyle changes which include nutrition, exercise and behavioral modification as well as the importance of customizing this to his specific health and social needs.  We reviewed the role of medical interventions including pharmacotherapy and surgical interventions.   We discussed the benefits of reaching a healthier weight to alleviate the symptoms of existing conditions and reduce the risks of the biomechanical, cardiometabolic and psychological effects of obesity.  We reviewed our program approach and philosophy, which are guided by the four pillars of obesity medicine.  We discussed how to prepare for intake appointment and the importance of fasting and avoidance of stimulants for at least 8 hours prior to indirect calorimetry.  Alverna LITTIE Clause appears to be in the action stage of change and reports being ready to initiate intensive lifestyle and behavioral modifications as part of their weight loss journey.  Attestation  Reviewed by clinician on day of visit: allergies, medications, problem list, medical history, surgical history, family history, social  history, and previous encounter notes pertinent to obesity diagnosis.  I have spent 50 minutes in the care of the patient today including: 5 minutes before the visit reviewing and preparing the chart. 38 minutes face-to-face assessing and reviewing listed medical problems as outlined in obesity care plan, providing nutritional and behavioral counseling on topics outlined in the obesity care plan, independently interpreting test results and goals of care, as described in assessment and plan, reviewing and discussing biometric information and progress, reviewing latest PCP notes and specialist consultations, and reviewing diagnostic imaging 7 minutes after the visit updating chart and documentation of encounter.   Lucas Parker, MD, ABIM, [place]

## 2024-06-14 NOTE — Assessment & Plan Note (Signed)
 On CT angiogram he also has coronary artery calcifications.  He might benefit from high intensity statin therapy in addition to improve blood pressure control.

## 2024-06-14 NOTE — Assessment & Plan Note (Signed)
 Contributing factors: Family history, genetics, OSA We reviewed anthropometrics, biometrics, associated medical conditions and contributing factors with patient. he would benefit from a medically tailored reduced calorie nutrional plan based on his REE (resting energy expenditure), which will be determined by indirect calorimetry.  We will also assess for cardiometabolic risk and nutritional derangements via fasting labs at intake appointment.

## 2024-06-14 NOTE — Assessment & Plan Note (Signed)
 He is currently on moderate intensity statin therapy.  Has an elevated cardiovascular risk The 10-year ASCVD risk score (Arnett DK, et al., 2019) is: 24.7%.  He also has presence of aortic atherosclerosis and coronary artery calcifications on CT.  Continue with cardiovascular risk reduction.  He may benefit from GLP-1 treatment.

## 2024-06-14 NOTE — Assessment & Plan Note (Signed)
 On anticoagulation.  He has a high degree of visceral adiposity which increase the risk of thromboembolic disease.  Losing weight may reduce the risk also the use of GLP-1.  Continue NOAC.

## 2024-06-16 ENCOUNTER — Other Ambulatory Visit: Payer: Self-pay | Admitting: Internal Medicine

## 2024-06-20 ENCOUNTER — Ambulatory Visit (INDEPENDENT_AMBULATORY_CARE_PROVIDER_SITE_OTHER): Payer: PPO | Admitting: Internal Medicine

## 2024-06-20 ENCOUNTER — Telehealth: Payer: Self-pay | Admitting: Internal Medicine

## 2024-06-20 ENCOUNTER — Encounter: Payer: Self-pay | Admitting: Internal Medicine

## 2024-06-20 ENCOUNTER — Ambulatory Visit: Payer: Self-pay | Admitting: Internal Medicine

## 2024-06-20 VITALS — BP 130/86 | HR 67 | Temp 98.5°F | Ht 75.0 in | Wt 309.0 lb

## 2024-06-20 DIAGNOSIS — I82409 Acute embolism and thrombosis of unspecified deep veins of unspecified lower extremity: Secondary | ICD-10-CM

## 2024-06-20 DIAGNOSIS — Z0000 Encounter for general adult medical examination without abnormal findings: Secondary | ICD-10-CM | POA: Diagnosis not present

## 2024-06-20 DIAGNOSIS — I700 Atherosclerosis of aorta: Secondary | ICD-10-CM

## 2024-06-20 DIAGNOSIS — G4733 Obstructive sleep apnea (adult) (pediatric): Secondary | ICD-10-CM

## 2024-06-20 DIAGNOSIS — Z6838 Body mass index (BMI) 38.0-38.9, adult: Secondary | ICD-10-CM

## 2024-06-20 DIAGNOSIS — I10 Essential (primary) hypertension: Secondary | ICD-10-CM

## 2024-06-20 LAB — LIPID PANEL
Cholesterol: 168 mg/dL (ref 0–200)
HDL: 45.3 mg/dL (ref >39.00)
LDL Cholesterol: 105 mg/dL — ABNORMAL HIGH (ref 0–99)
NonHDL: 122.95
Total CHOL/HDL Ratio: 4
Triglycerides: 88 mg/dL (ref 0.0–149.0)
VLDL: 17.6 mg/dL (ref 0.0–40.0)

## 2024-06-20 LAB — COMPREHENSIVE METABOLIC PANEL WITH GFR
ALT: 19 U/L (ref 0–53)
AST: 19 U/L (ref 0–37)
Albumin: 4.4 g/dL (ref 3.5–5.2)
Alkaline Phosphatase: 79 U/L (ref 39–117)
BUN: 17 mg/dL (ref 6–23)
CO2: 31 meq/L (ref 19–32)
Calcium: 9.1 mg/dL (ref 8.4–10.5)
Chloride: 102 meq/L (ref 96–112)
Creatinine, Ser: 1.08 mg/dL (ref 0.40–1.50)
GFR: 68.32 mL/min (ref >60.00)
Glucose, Bld: 110 mg/dL — ABNORMAL HIGH (ref 70–99)
Potassium: 4 meq/L (ref 3.5–5.1)
Sodium: 140 meq/L (ref 135–145)
Total Bilirubin: 0.9 mg/dL (ref 0.2–1.2)
Total Protein: 7.1 g/dL (ref 6.0–8.3)

## 2024-06-20 LAB — CBC
HCT: 44.5 % (ref 39.0–52.0)
Hemoglobin: 14.8 g/dL (ref 13.0–17.0)
MCHC: 33.3 g/dL (ref 30.0–36.0)
MCV: 91.1 fl (ref 78.0–100.0)
Platelets: 172 K/uL (ref 150.0–400.0)
RBC: 4.89 Mil/uL (ref 4.22–5.81)
RDW: 13.6 % (ref 11.5–15.5)
WBC: 6.6 K/uL (ref 4.0–10.5)

## 2024-06-20 NOTE — Assessment & Plan Note (Signed)
Sleeps nightly with CPAP 

## 2024-06-20 NOTE — Telephone Encounter (Signed)
 Pt asked if Dr. Avelina would accept him as a pt since Dr. Jimmy is retiring? Pt mentioned he has seen Dr. Avelina numerous times for visits. Is this TOC okay? Call back # [phone]

## 2024-06-20 NOTE — Assessment & Plan Note (Signed)
On imaging Is on statin

## 2024-06-20 NOTE — Assessment & Plan Note (Signed)
 Healthy but needs to work on fitness Last screening colon 2028 No PSA due to age Flu/COVID in the fall

## 2024-06-20 NOTE — Assessment & Plan Note (Signed)
 BP Readings from Last 3 Encounters:  06/20/24 130/86  06/14/24 135/86  05/24/24 130/78   Controlled with lisinopril  10/hydrochlorothiazide  25 and metoprolol  25

## 2024-06-20 NOTE — Assessment & Plan Note (Signed)
 Continues on eliquis  5 bid

## 2024-06-20 NOTE — Assessment & Plan Note (Signed)
 BMI 38 with HTN, OSA, past DVT etc Starting with clinic for this

## 2024-06-20 NOTE — Progress Notes (Signed)
 Subjective:    Patient ID: Russell Garrett, male    DOB: 10/07/1951, 73 y.o.   MRN: [id number]  HPI Here for physical  Fighting a summer cold Just some cough Doesn't feel sick---?slight sore throat  Otherwise doing okay Starting at weight loss clinic Weight is stable--BMI 38 Plan is lifestyle--not really exercising some  Known aortic atherosclerosis  Mild ankle swelling at the end of the day Better in the morning  Sleeps with CPAP nightly and that works for him  Current Outpatient Medications on File Prior to Visit  Medication Sig Dispense Refill   atorvastatin  (LIPITOR) 40 MG tablet TAKE 1 TABLET BY MOUTH DAILY 90 tablet 0   ELIQUIS  5 MG TABS tablet TAKE ONE (1) TABLET BY MOUTH TWO TIMES PER DAY 180 tablet 3   hydrochlorothiazide  (HYDRODIURIL ) 12.5 MG tablet Take 1 tablet (12.5 mg total) by mouth daily. 90 tablet 3   losartan  (COZAAR ) 100 MG tablet Take 1 tablet (100 mg total) by mouth daily. 90 tablet 3   metoprolol  succinate (TOPROL -XL) 25 MG 24 hr tablet TAKE 1 TABLET BY MOUTH ONCE DAILY MUST KEEP APPOINTMENT 04/28/24 FOR MORE REFILLS 90 tablet 2   MP MAGNESIUM PO Take by mouth.     OVER THE COUNTER MEDICATION Elderberry/Vitamin C/Zinc     No current facility-administered medications on file prior to visit.    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   Diverticulitis    pt denies   Hamstring muscle strain 2024   Left Leg   Hyperlipidemia    Hypertension    Mild ascending aorta dilatation (HCC)    4.8cm in diamter per CT chest, see imaging in epic dated 02-25-18   OSA (obstructive sleep apnea)    Pulmonary air embolism (HCC) 02/2018   lifetime eliquis  since then    Sleep apnea    CPAP    Past Surgical History:  Procedure Laterality Date   BACK SURGERY     COLONOSCOPY     REFRACTIVE SURGERY Right 07/25/2022   THORACENTESIS  08/2006   left pleural effusion    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   Procedure: RIGHT TOTAL HIP ARTHROPLASTY ANTERIOR APPROACH;  Surgeon: Melodi Lerner, MD;  Location: [facility];  Service: Orthopedics;  Laterality: Right;     Family History  Problem Relation Age of Onset   Hypertension Mother    Heart disease Mother    Kidney disease Mother    Heart disease Father    Stroke Father    Prostate cancer Father    Kidney disease Father    Heart disease Brother        cardioversion for arrhythmia   Diabetes Neg Hx    Colon cancer Neg Hx     Social History   Socioeconomic History   Marital status: Married    Spouse name: Not on file   Number of children: 2   Years of education: Not on file   Highest education level: 12th grade  Occupational History   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  Tobacco Use   Smoking status: Former    Current packs/day: 0.00    Average packs/day: 1 pack/day for 35.0 years (35.0 ttl pk-yrs)    Types: Cigarettes    Start date: 12/15/1980    Quit date: 12/16/2015    Years since quitting:  8.5    Passive exposure: Never   Smokeless tobacco: Never  Vaping Use   Vaping status: Never Used  Substance and Sexual Activity   Alcohol use: Yes    Alcohol/week: 0.0 standard drinks of alcohol    Comment: occ   Drug use: No   Sexual activity: Not on file  Other Topics Concern   Not on file  Social History Narrative   Widowed   Remarried June 2014      Has living will   Wife is health care POA--- alternate is daughter, Sonny   Would accept resuscitation    Probably no tube feeds if cognitively unaware.   Social Drivers of Corporate investment banker Strain: Low Risk  (05/20/2024)   Overall Financial Resource Strain (CARDIA)    Difficulty of Paying Living Expenses: Not very hard  Food Insecurity: No Food Insecurity (05/20/2024)   Hunger Vital Sign    Worried About Running Out of Food in the Last Year: Never true    Ran Out of Food in the Last Year: Never true   Transportation Needs: No Transportation Needs (05/20/2024)   PRAPARE - Administrator, Civil Service (Medical): No    Lack of Transportation (Non-Medical): No  Physical Activity: Insufficiently Active (05/20/2024)   Exercise Vital Sign    Days of Exercise per Week: 2 days    Minutes of Exercise per Session: 20 min  Stress: No Stress Concern Present (05/20/2024)   [employer] of Occupational Health - Occupational Stress Questionnaire    Feeling of Stress: Not at all  Social Connections: Socially Integrated (05/20/2024)   Social Connection and Isolation Panel    Frequency of Communication with Friends and Family: Three times a week    Frequency of Social Gatherings with Friends and Family: Once a week    Attends Religious Services: More than 4 times per year    Active Member of [employer] or Organizations: Yes    Attends Engineer, structural: More than 4 times per year    Marital Status: Married  Catering manager Violence: Not At Risk (05/24/2024)   Humiliation, Afraid, Rape, and Kick questionnaire    Fear of Current or Ex-Partner: No    Emotionally Abused: No    Physically Abused: No    Sexually Abused: No   Review of Systems  Constitutional:  Negative for fatigue and unexpected weight change.       Wears seat belt  HENT:  Negative for dental problem, hearing loss and tinnitus.        Keeps up with dentist  Eyes:  Negative for visual disturbance.       No diplopia or unilateral vision loss  Respiratory:  Positive for cough. Negative for chest tightness and shortness of breath.   Cardiovascular:  Positive for leg swelling. Negative for chest pain and palpitations.  Gastrointestinal:  Negative for blood in stool and constipation.       No heartburn  Endocrine: Negative for polydipsia and polyuria.  Genitourinary:  Negative for difficulty urinating and urgency.       No sexual problems  Musculoskeletal:  Negative for back pain and joint swelling.       Stiff but  not really in pain  Skin:  Negative for rash.  Allergic/Immunologic: Negative for immunocompromised state.       Wonders if current symptoms are allergy  Neurological:  Negative for dizziness, syncope, light-headedness and headaches.  Hematological:  Negative for adenopathy. Bruises/bleeds easily.  Psychiatric/Behavioral:  Negative for dysphoric mood and sleep disturbance. The patient is not nervous/anxious.        Objective:   Physical Exam Constitutional:      Appearance: Normal appearance.  HENT:     Mouth/Throat:     Pharynx: No oropharyngeal exudate or posterior oropharyngeal erythema.  Eyes:     Conjunctiva/sclera: Conjunctivae normal.     Pupils: Pupils are equal, round, and reactive to light.  Cardiovascular:     Rate and Rhythm: Normal rate and regular rhythm.     Pulses: Normal pulses.     Heart sounds: No murmur heard.    No gallop.  Pulmonary:     Effort: Pulmonary effort is normal.     Breath sounds: Normal breath sounds. No wheezing or rales.  Abdominal:     Palpations: Abdomen is soft.     Tenderness: There is no abdominal tenderness.  Musculoskeletal:     Cervical back: Neck supple.     Comments: Trace pedal/ankle edeam  Lymphadenopathy:     Cervical: No cervical adenopathy.  Skin:    Findings: No lesion or rash.  Neurological:     General: No focal deficit present.     Mental Status: He is alert and oriented to person, place, and time.  Psychiatric:        Mood and Affect: Mood normal.        Behavior: Behavior normal.            Assessment & Plan:

## 2024-06-21 NOTE — Telephone Encounter (Signed)
Okay for TOC

## 2024-06-22 NOTE — Telephone Encounter (Signed)
 Spoke to pt, sch toc for 12/14/23

## 2024-08-10 ENCOUNTER — Other Ambulatory Visit: Payer: Self-pay | Admitting: Thoracic Surgery (Cardiothoracic Vascular Surgery)

## 2024-08-10 DIAGNOSIS — I712 Thoracic aortic aneurysm, without rupture, unspecified: Secondary | ICD-10-CM

## 2024-08-15 DIAGNOSIS — J209 Acute bronchitis, unspecified: Secondary | ICD-10-CM | POA: Diagnosis not present

## 2024-08-15 DIAGNOSIS — R058 Other specified cough: Secondary | ICD-10-CM | POA: Diagnosis not present

## 2024-08-16 DIAGNOSIS — H52203 Unspecified astigmatism, bilateral: Secondary | ICD-10-CM | POA: Diagnosis not present

## 2024-08-16 DIAGNOSIS — H524 Presbyopia: Secondary | ICD-10-CM | POA: Diagnosis not present

## 2024-08-16 DIAGNOSIS — H31001 Unspecified chorioretinal scars, right eye: Secondary | ICD-10-CM | POA: Diagnosis not present

## 2024-08-16 DIAGNOSIS — H2513 Age-related nuclear cataract, bilateral: Secondary | ICD-10-CM | POA: Diagnosis not present

## 2024-08-16 DIAGNOSIS — H43813 Vitreous degeneration, bilateral: Secondary | ICD-10-CM | POA: Diagnosis not present

## 2024-08-16 DIAGNOSIS — H5213 Myopia, bilateral: Secondary | ICD-10-CM | POA: Diagnosis not present

## 2024-08-16 DIAGNOSIS — H25013 Cortical age-related cataract, bilateral: Secondary | ICD-10-CM | POA: Diagnosis not present

## 2024-08-29 ENCOUNTER — Encounter (INDEPENDENT_AMBULATORY_CARE_PROVIDER_SITE_OTHER): Payer: Self-pay | Admitting: Internal Medicine

## 2024-08-29 ENCOUNTER — Ambulatory Visit (INDEPENDENT_AMBULATORY_CARE_PROVIDER_SITE_OTHER): Admitting: Internal Medicine

## 2024-08-29 VITALS — BP 134/82 | HR 58 | Temp 97.9°F | Ht 73.0 in | Wt 295.0 lb

## 2024-08-29 DIAGNOSIS — R0602 Shortness of breath: Secondary | ICD-10-CM

## 2024-08-29 DIAGNOSIS — R7303 Prediabetes: Secondary | ICD-10-CM | POA: Insufficient documentation

## 2024-08-29 DIAGNOSIS — R948 Abnormal results of function studies of other organs and systems: Secondary | ICD-10-CM | POA: Insufficient documentation

## 2024-08-29 DIAGNOSIS — Z6838 Body mass index (BMI) 38.0-38.9, adult: Secondary | ICD-10-CM

## 2024-08-29 DIAGNOSIS — E66812 Obesity, class 2: Secondary | ICD-10-CM | POA: Diagnosis not present

## 2024-08-29 DIAGNOSIS — G4733 Obstructive sleep apnea (adult) (pediatric): Secondary | ICD-10-CM

## 2024-08-29 DIAGNOSIS — Z1331 Encounter for screening for depression: Secondary | ICD-10-CM

## 2024-08-29 DIAGNOSIS — Z87891 Personal history of nicotine dependence: Secondary | ICD-10-CM | POA: Diagnosis not present

## 2024-08-29 DIAGNOSIS — E7800 Pure hypercholesterolemia, unspecified: Secondary | ICD-10-CM | POA: Diagnosis not present

## 2024-08-29 DIAGNOSIS — R5383 Other fatigue: Secondary | ICD-10-CM

## 2024-08-29 DIAGNOSIS — I10 Essential (primary) hypertension: Secondary | ICD-10-CM

## 2024-08-29 NOTE — Progress Notes (Signed)
 [address] [place],  [place], [place] [address]  Office: [phone]  /  Fax: [phone]   Subjective   Initial Visit  Russell Garrett (MR# [id number]) is a 73 y.o. male who presents for evaluation and treatment of obesity and related comorbidities. Current BMI is Body mass index is 38.92 kg/m. Russell Garrett has been struggling with his weight for many years and has been unsuccessful in either losing weight, maintaining weight loss, or reaching his healthy weight goal.  Russell Garrett is currently in the action stage of change and ready to dedicate time achieving and maintaining a healthier weight. Russell Garrett is interested in becoming our patient and working on intensive lifestyle modifications including (but not limited to) diet and exercise for weight loss.  Weight history:  Obesity Quality of Life and Psychosocial Complications: Reduced health-related quality of life   Cardiometabolic: Dyslipidemia or hypercholesterolemia, Hypertension, and Venous thromboembolism   Biomechanical: Obstructive sleep apnea and on CPAP, sleep study done [facility]    Weight Related History   He was referred by: Specialist   When asked what they would like to accomplish? He states: Improve energy levels and physical activity, Improve existing medical conditions, and Improve quality of life   Weight history: Started to gain more weight last 4 years   Highest weight: 325   Contributing factors: family history of obesity, disruption of circadian rhythm / sleep disordered breathing, use of obesogenic medications: Beta-blockers, and reduced physical activity   Prior weight loss attempts: Optavia and lost 50-75 pounds over 6 months   Current or previous pharmacotherapy: None   Response to medication: Never tried medications   Current nutrition plan: None   Greatest challenge with dieting: cost.   Current level of physical activity: NEAT   Barriers to Exercise: energy    Discussed the use of AI scribe software  for clinical note transcription with the patient, who gave verbal consent to proceed.  History of Present Illness Russell Garrett is a 73 year old male who presents for medical weight management. He is accompanied by a family member.  He has a history of blood clots and moderate sleep apnea, for which he has been using a CPAP machine for fifteen years. His sleep apnea was confirmed as moderate during a sleep study conducted in [place]. He experiences shortness of breath upon exertion, which began after significant weight gain, and describes needing to rest after physical activities such as carrying wheelbarrow loads to catch his breath.  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 No family history of aneurysms is noted, but there is a family history of heart conditions. He does not have a history of heart failure but experiences shortness of breath with exertion, such as climbing stairs, which resolves after a brief rest.  He has a history of smoking for forty years at a rate of about a pack a day but has not undergone lung cancer screening. He has a history of high blood pressure and is currently on hydrochlorothiazide . He drinks about ninety ounces of water  per day and notes that his urine is clear.  He has a history of prediabetes with a fasting blood sugar of 110 mg/dL noted during his last physical in July.  He typically consumes a breakfast of a banana, a hard-boiled egg, and half a glass of tomato juice. He occasionally skips lunch due to being busy but usually has breakfast. He drinks one diet soda  per day and consumes alcohol once or twice a week. He uses Stevia as a sweetener and identifies his worst habit as eating too much food, often feeling 'stuffed' after meals. He eats quickly, which contributes to overeating.  He has a history of using the [employer], which resulted in significant weight loss. He  eats out three to four times a week and enjoys cooking at home. He does not eat between meals often, but if hungry before dinner, he may have a handful of almonds. He reports a family history of obesity and acknowledges the genetic component of his weight issues. He retired from Location [employer] a [employer] in 2011 and did a lot of walking during his career. He prefers walking on a golf course rather than in a mall.   Nutritional History:  Current nutrition plan: None.  How many times do you eat outside the home: 2-4 per week  How often do they skip meals: skips lunch  What beverages do they drink: diet soda , juice, and alcohol.   Use of artificial sweetners : Yes  Food intolerances or dislikes: Does not eat enough vegetables.  Food triggers: None.  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   Diverticulitis    pt denies   Edema of both lower extremities    Hamstring muscle strain 2024   Left Leg   Hyperlipidemia    Hypertension    Mild ascending aorta dilatation    4.8cm in diamter per CT chest, see imaging in epic dated 02-25-18   Obesity    OSA (obstructive sleep apnea)    Osteoarthritis    Pulmonary air embolism (HCC) 02/2018   lifetime eliquis  since then    Sleep apnea    CPAP   Thoracic aortic aneurysm      Objective   BP 134/82   Pulse (!) 58   Temp 97.9 F (36.6 C)   Ht 6' 1 (1.854 m)   Wt 295 lb (133.8 kg)   SpO2 96%   BMI 38.92 kg/m  He was weighed on the bioimpedance scale: Body mass index is 38.92 kg/m.    Anthropometrics:  Vitals Temp: 97.9 F (36.6 C) BP: 134/82 Pulse Rate: (!)  58 SpO2: 96 %   Anthropometric Measurements Height: 6' 1 (1.854 m) Weight: 295 lb (133.8 kg) BMI (Calculated): 38.93 Starting Weight: 295 lb Peak Weight: 325 lb Waist Measurement : 52 inches   Body Composition  Body Fat %: 40.2 % Fat Mass (lbs): 118.6 lbs Muscle Mass (lbs): 168 lbs Total Body Water  (lbs): 130.4 lbs Visceral Fat Rating : 27   Other Clinical Data RMR: 1800 Fasting: yes Labs: yes Today's Visit #: 1 Starting Date: 08/29/24    Physical Exam:  General: He is overweight, cooperative, alert, well developed, and in no acute distress. PSYCH: Has normal mood, affect and thought process.   HEENT: EOMI, sclerae are anicteric. Lungs: Normal breathing effort, no conversational dyspnea. Extremities: No edema.  Neurologic: No gross sensory or motor deficits. No tremors or fasciculations noted.    Diagnostic Data Reviewed  EKG:  Normal sinus rhythm, rate 59 bpm. No conduction abnormalities, abnormal Q waves or chamber enlargement.  Indirect Calorimeter completed today shows a VO2 of 261 and a REE of 1800.  His calculated basal metabolic rate is 7542 thus his resting energy expenditure slower than calculated.  Depression Screen  Mariano's PHQ-9 score was: 1.     08/29/2024    9:15 AM  Depression screen PHQ 2/9  Decreased Interest 0  Down, Depressed, Hopeless 0  PHQ - 2 Score 0  Altered sleeping 0  Tired, decreased energy 1  Change in appetite 0  Feeling bad or failure about yourself  0  Trouble concentrating 0  Moving slowly or fidgety/restless 0  Suicidal thoughts 0  PHQ-9 Score 1  Difficult doing work/chores Somewhat difficult    Screening for Sleep Related Breathing Disorders  Sione denies daytime somnolence and denies waking up still tired. Patient does not have symptoms of OSA due to wearing a CPAP. Klever generally gets 8 or 9 hours of sleep per night, and states that he has generally restful sleep. Snoring is not present due to CPAP. Apneic episodes  are not present. Epworth Sleepiness Score is 5.   BMET    Component Value Date/Time   NA 140 06/20/2024 0920   K 4.0 06/20/2024 0920   CL 102 06/20/2024 0920   CO2 31 06/20/2024 0920   GLUCOSE 110 (H) 06/20/2024 0920   BUN 17 06/20/2024 0920   CREATININE 1.08 06/20/2024 0920   CALCIUM  9.1 06/20/2024 0920   GFRNONAA >60 11/28/2020 1246   GFRAA >60 09/28/2018 0419   No results found for: HGBA1C No results found for: INSULIN CBC    Component Value Date/Time   WBC 6.6 06/20/2024 0920   RBC 4.89 06/20/2024 0920   HGB 14.8 06/20/2024 0920   HCT 44.5 06/20/2024 0920   PLT 172.0 06/20/2024 0920   MCV 91.1 06/20/2024 0920   MCH 30.7 11/28/2020 1246   MCHC 33.3 06/20/2024 0920   RDW 13.6 06/20/2024 0920   Iron/TIBC/Ferritin/ %Sat No results found for: IRON, TIBC, FERRITIN, IRONPCTSAT Lipid Panel     Component Value Date/Time   CHOL 168 06/20/2024 0920   TRIG 88.0 06/20/2024 0920   HDL 45.30 06/20/2024 0920   CHOLHDL 4 06/20/2024 0920   VLDL 17.6 06/20/2024 0920   LDLCALC 105 (H) 06/20/2024 0920   LDLDIRECT 161.3 02/03/2011 1229   Hepatic Function Panel     Component Value Date/Time   PROT 7.1 06/20/2024 0920   ALBUMIN 4.4 06/20/2024 0920   AST 19 06/20/2024 0920   ALT 19 06/20/2024 0920   ALKPHOS 79 06/20/2024 0920   BILITOT 0.9 06/20/2024 0920   BILIDIR 0.2 03/02/2013 0929      Component Value Date/Time   TSH 1.72 03/03/2014 1232     Assessment and Plan   TREATMENT PLAN FOR OBESITY:  Recommended Dietary Goals  Calel is currently in the action stage of change. As such, his goal is to implement medically supervised obesity management plan.  He has agreed to implement: the Category 3 plan - 1500 kcal per day  Behavioral Intervention  We discussed the following Behavioral Modification Strategies today: increasing lean protein intake to established goals, decreasing simple carbohydrates , increasing vegetables, increasing lower glycemic fruits,  increasing fiber rich foods, avoiding skipping meals, increasing water  intake, work on meal planning and preparation, reading food labels , keeping healthy foods at home, identifying sources and decreasing liquid calories, decreasing eating out or consumption of processed foods, and  making healthy choices when eating convenient foods, planning for success, and better snacking choices  Additional resources provided today: Handout on healthy eating and balanced plate, Handout on complex carbohydrates and lean sources of protein, Category 3 packet, and Handout principles of weight management  Recommended Physical Activity Goals  Hosey has been advised to work up to 150 minutes of moderate intensity aerobic activity a week and strengthening exercises 2-3 times per week for cardiovascular health, weight loss maintenance and preservation of muscle mass.   He has agreed to :  Think about enjoyable ways to increase daily physical activity and overcoming barriers to exercise, Increase physical activity in their day and reduce sedentary time (increase NEAT)., Increase volume of physical activity to a goal of 240 minutes a week, and Combine aerobic and strengthening exercises for efficiency and improved cardiometabolic health.  Medical Interventions and Pharmacotherapy We will work on building a Therapist, art and behavioral strategies. We will discuss the role of pharmacotherapy as an adjunct at subsequent visits.   ASSOCIATED CONDITIONS ADDRESSED TODAY  Other Fatigue  Champ denies daytime somnolence and denies waking up still tired. Patient does not have symptoms of OSA due to wearing a CPAP. Mosi generally gets 8 or 9 hours of sleep per night, and states that he has generally restful sleep. Snoring is not present due to CPAP. Apneic episodes are not present. Epworth Sleepiness Score is 5. SABRA Alverna does feel that his weight is causing his energy to be lower than it should be. Fatigue may be  related to obesity, depression or many other causes. Labs will be ordered, and in the meanwhile, Emeka will focus on self care including making healthy food choices, increasing physical activity and focusing on stress reduction.  Shortness of Breath Dwayne notes increasing shortness of breath with physical activity and seems to be worsening over time with weight gain. He notes getting out of breath sooner with activity than he used to. This has not gotten worse recently. Buckley denies shortness of breath at rest or orthopnea.  Assessment & Plan Other fatigue  SOB (shortness of breath) on exertion  Depression screen  Essential hypertension, benign Vitals:   08/29/24 0900  BP: 134/82    Blood pressure is at goal for age and risk category.  On hydrochlorothiazide , losartan  and metoprolol .  Metoprolol  may affect metabolic rate.  Most recent renal parameters show a GFR in the 60s likely due to inadequate fluid intake and the use of ARB and hydrochlorothiazide .  Patient advised on increasing water  intake to at least 90 ounces per day.  Continue with weight loss therapy. Losing 10% may improve blood pressure control. Monitor for symptoms of orthostasis while losing weight. Continue current regimen and home monitoring for a goal blood pressure of < 120/80.   Obstructive sleep apnea (adult) (pediatric) He thinks he had moderate sleep apnea based on sleep study.  He reports adequate compliance with PAP therapy. Pure hypercholesterolemia He is currently on moderate intensity statin therapy.  Has an elevated cardiovascular risk The 10-year ASCVD risk score (Arnett DK, et al., 2019) is: 26.3%.  He also has presence of aortic atherosclerosis and coronary artery calcifications on CT. We will check high-sensitivity CRPAnd if elevated he may benefit from a higher dose of statin therapy.  Class 2 severe obesity with serious comorbidity and body mass index (BMI) of 38.0 to 38.9 in adult, unspecified obesity  type Obesity with a low metabolic rate, contributing to difficulty in weight loss. Metabolic rate is 8199  calories per day, 23% lower than expected. Genetic predisposition to obesity and history of smoking may contribute to metabolic issues. Rapid eating may lead to overeating before satiety signals are received. - Implement a 1500 calorie diet with high protein and low carbohydrates. - Encourage slow and mindful eating practices. - Educate on the importance of exercise to increase metabolic rate and muscle activation. - Discuss the role of sleep, hydration, and protein intake in boosting metabolic rate. - Provide a meal plan and grocery list for a 1500 calorie diet. - Encourage the use of apps for calorie tracking and meal planning. - Discuss the potential use of AI tools like ChatGPT for meal planning. - Encourage the use of minimally processed foods and complex carbohydrates. - Advise on the use of lean proteins and healthy fats in the diet. Abnormal metabolism Patient has a slower than predicted metabolism. IC 1800 vs. calculated  2457. This may contribute to weight gain, chronic fatigue and difficulty losing weight.  he is on metoprolol  which may have an effect on metabolic rate.  We reviewed measures to improve metabolism including not skipping meals, progressive strengthening exercises, increasing protein intake at every meal and maintaining adequate hydration and sleep.   Prediabetes   Based on recent fasting blood glucose of 109.  No hemoglobin A1c on file.  Patient will be screened further for   Glucose metabolic disorders.  We will repeat fasting blood glucose, check hemoglobin A1c and fasting insulin levels Former smoker This seems like he smoked 40 pack years and may benefit from lung cancer screening they will discuss this further with pulmonary.       Follow-up  He was informed of the importance of frequent follow-up visits to maximize his success with intensive lifestyle  modifications for his multiple health conditions. He was informed we would discuss his lab results at his next visit unless there is a critical issue that needs to be addressed sooner. Veryl agreed to keep his next visit at the agreed upon time to discuss these results.  Attestation Statement  This is the patient's intake visit at [facility]. The patient's Health Questionnaire was reviewed at length. Included in the packet: current and past health history, medications, allergies, ROS, gynecologic history (women only), surgical history, family history, social history, weight history, weight loss surgery history (for those that have had weight loss surgery), nutritional evaluation, mood and food questionnaire, PHQ9, Epworth questionnaire, sleep habits questionnaire, patient life and health improvement goals questionnaire. These will all be scanned into the patient's chart under media.   During the visit, I independently reviewed the patient's EKG, previous labs, bioimpedance scale results, and indirect calorimetry results. I used this information to medically tailor a meal plan for the patient that will help him to lose weight and will improve his obesity-related conditions. I performed a medically necessary appropriate examination and/or evaluation. I discussed the assessment and treatment plan with the patient. The patient was provided an opportunity to ask questions and all were answered. The patient agreed with the plan and demonstrated an understanding of the instructions. Labs were ordered at this visit and will be reviewed at the next visit unless critical results need to be addressed immediately. Clinical information was updated and documented in the EMR.   In addition, they received basic education on identification of processed foods and reduction of these, different sources of lean proteins and complex carbohydrates and how to eat balanced by incorporation of whole foods.  Reviewed  by clinician  on day of visit: allergies, medications, problem list, medical history, surgical history, family history, social history, and previous encounter notes.  I have spent 56 minutes in the care of the patient today including: 2 minutes before the visit reviewing and preparing the chart.  46 minutes face-to-face assessing and reviewing listed medical problems as outlined in obesity care plan, providing nutritional and behavioral counseling on topics outlined in the obesity care plan, independently interpreting test results and goals of care, as described in assessment and plan, reviewing and discussing biometric information and progress, and ordering diagnostics - see orders 8 minutes after the visit updating chart and documentation of encounter.       Lucas Parker, MD

## 2024-08-29 NOTE — Assessment & Plan Note (Signed)
 This seems like he smoked 40 pack years and may benefit from lung cancer screening they will discuss this further with pulmonary.

## 2024-08-29 NOTE — Assessment & Plan Note (Addendum)
 He is currently on moderate intensity statin therapy.  Has an elevated cardiovascular risk The 10-year ASCVD risk score (Arnett DK, et al., 2019) is: 26.3%.  He also has presence of aortic atherosclerosis and coronary artery calcifications on CT. We will check high-sensitivity CRPAnd if elevated he may benefit from a higher dose of statin therapy.

## 2024-08-29 NOTE — Assessment & Plan Note (Signed)
 Obesity with a low metabolic rate, contributing to difficulty in weight loss. Metabolic rate is 8199 calories per day, 23% lower than expected. Genetic predisposition to obesity and history of smoking may contribute to metabolic issues. Rapid eating may lead to overeating before satiety signals are received. - Implement a 1500 calorie diet with high protein and low carbohydrates. - Encourage slow and mindful eating practices. - Educate on the importance of exercise to increase metabolic rate and muscle activation. - Discuss the role of sleep, hydration, and protein intake in boosting metabolic rate. - Provide a meal plan and grocery list for a 1500 calorie diet. - Encourage the use of apps for calorie tracking and meal planning. - Discuss the potential use of AI tools like ChatGPT for meal planning. - Encourage the use of minimally processed foods and complex carbohydrates. - Advise on the use of lean proteins and healthy fats in the diet.

## 2024-08-29 NOTE — Assessment & Plan Note (Signed)
 Based on recent fasting blood glucose of 109.  No hemoglobin A1c on file.  Patient will be screened further for   Glucose metabolic disorders.  We will repeat fasting blood glucose, check hemoglobin A1c and fasting insulin levels

## 2024-08-29 NOTE — Assessment & Plan Note (Signed)
 He thinks he had moderate sleep apnea based on sleep study.  He reports adequate compliance with PAP therapy.

## 2024-08-29 NOTE — Assessment & Plan Note (Signed)
 Patient has a slower than predicted metabolism. IC 1800 vs. calculated  2457. This may contribute to weight gain, chronic fatigue and difficulty losing weight.  he is on metoprolol  which may have an effect on metabolic rate.  We reviewed measures to improve metabolism including not skipping meals, progressive strengthening exercises, increasing protein intake at every meal and maintaining adequate hydration and sleep.

## 2024-08-29 NOTE — Assessment & Plan Note (Addendum)
 Vitals:   08/29/24 0900  BP: 134/82    Blood pressure is at goal for age and risk category.  On hydrochlorothiazide , losartan  and metoprolol .  Metoprolol  may affect metabolic rate.  Most recent renal parameters show a GFR in the 60s likely due to inadequate fluid intake and the use of ARB and hydrochlorothiazide .  Patient advised on increasing water  intake to at least 90 ounces per day.  Continue with weight loss therapy. Losing 10% may improve blood pressure control. Monitor for symptoms of orthostasis while losing weight. Continue current regimen and home monitoring for a goal blood pressure of < 120/80.

## 2024-08-30 ENCOUNTER — Ambulatory Visit (HOSPITAL_COMMUNITY)
Admission: RE | Admit: 2024-08-30 | Discharge: 2024-08-30 | Disposition: A | Discharge status: Home / Self Care | Source: Ambulatory Visit | Attending: Thoracic Surgery (Cardiothoracic Vascular Surgery) | Admitting: Thoracic Surgery (Cardiothoracic Vascular Surgery)

## 2024-08-30 DIAGNOSIS — I712 Thoracic aortic aneurysm, without rupture, unspecified: Secondary | ICD-10-CM | POA: Diagnosis present

## 2024-08-30 DIAGNOSIS — I700 Atherosclerosis of aorta: Secondary | ICD-10-CM | POA: Diagnosis not present

## 2024-08-30 DIAGNOSIS — I7121 Aneurysm of the ascending aorta, without rupture: Secondary | ICD-10-CM | POA: Insufficient documentation

## 2024-08-30 DIAGNOSIS — I2510 Atherosclerotic heart disease of native coronary artery without angina pectoris: Secondary | ICD-10-CM | POA: Diagnosis not present

## 2024-08-30 LAB — INSULIN, RANDOM: INSULIN: 33 u[IU]/mL — ABNORMAL HIGH (ref 2.6–24.9)

## 2024-08-30 LAB — VITAMIN D 25 HYDROXY (VIT D DEFICIENCY, FRACTURES): Vit D, 25-Hydroxy: 55.1 ng/mL (ref 30.0–100.0)

## 2024-08-30 LAB — HIGH SENSITIVITY CRP: CRP, High Sensitivity: 0.75 mg/L (ref 0.00–3.00)

## 2024-08-30 LAB — HEMOGLOBIN A1C
Est. average glucose Bld gHb Est-mCnc: 117 mg/dL
Hgb A1c MFr Bld: 5.7 % — ABNORMAL HIGH (ref 4.8–5.6)

## 2024-08-30 LAB — VITAMIN B12: Vitamin B-12: 476 pg/mL (ref 232–1245)

## 2024-08-30 LAB — GLUCOSE, FASTING: Glucose, Plasma: 92 mg/dL (ref 70–99)

## 2024-08-30 LAB — TSH: TSH: 2.72 u[IU]/mL (ref 0.450–4.500)

## 2024-08-30 MED ORDER — IOHEXOL 350 MG/ML SOLN
75.0000 mL | Freq: Once | INTRAVENOUS | Status: AC | PRN
  Administered 2024-08-30: 75 mL via INTRAVENOUS

## 2024-09-05 DIAGNOSIS — H25812 Combined forms of age-related cataract, left eye: Secondary | ICD-10-CM | POA: Diagnosis not present

## 2024-09-05 DIAGNOSIS — H25012 Cortical age-related cataract, left eye: Secondary | ICD-10-CM | POA: Diagnosis not present

## 2024-09-05 DIAGNOSIS — Z961 Presence of intraocular lens: Secondary | ICD-10-CM | POA: Diagnosis not present

## 2024-09-05 DIAGNOSIS — H2512 Age-related nuclear cataract, left eye: Secondary | ICD-10-CM | POA: Diagnosis not present

## 2024-09-12 ENCOUNTER — Ambulatory Visit (INDEPENDENT_AMBULATORY_CARE_PROVIDER_SITE_OTHER): Admitting: Internal Medicine

## 2024-09-12 NOTE — Progress Notes (Unsigned)
 [address] [place] [address]             [phone]            AVON MERGENTHALER [id number] 03/31/1951   History of Present Illness:  Russell Garrett is a 73 year old male with medical history of hypertension, DVT, aortic atherosclerosis, OSA, diverticulosis, osteoarthritis, former smoker, prediabetes and hyperlipidemia who presents for continued surveillance of ascending thoracic aortic aneurysm.  Aneurysm has stayed stable in size and has measured 4.7 cm -4.9 cm.  On recent CTA of chest aneurysm measured 4.7 cm.  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  He presents the clinic today and reports that he is doing well.  His blood pressure is well-controlled with current medication therapy.  He walks 2 to 3 miles daily.  He denies chest pain, shortness of breath or lower leg swelling.    Current Outpatient Medications on File Prior to Visit  Medication Sig Dispense Refill   atorvastatin  (LIPITOR) 40 MG tablet TAKE 1 TABLET BY MOUTH DAILY 90 tablet 0   ELIQUIS  5 MG TABS tablet TAKE ONE (1) TABLET BY MOUTH TWO TIMES PER DAY 180 tablet 3   hydrochlorothiazide  (HYDRODIURIL ) 12.5 MG tablet Take 1 tablet (12.5 mg total) by mouth daily. 90 tablet 3   losartan  (COZAAR ) 100 MG tablet Take 1 tablet (100 mg total) by mouth daily. 90 tablet 3   metoprolol  succinate (TOPROL -XL) 25 MG 24 hr tablet TAKE 1 TABLET BY MOUTH ONCE DAILY MUST KEEP APPOINTMENT 04/28/24 FOR MORE REFILLS 90 tablet 2   OVER THE COUNTER MEDICATION Elderberry/Vitamin C/Zinc     No current facility-administered medications on file prior to visit.     ROS: Review of Systems  Respiratory: Negative.  Negative for cough, shortness of breath and wheezing.   Cardiovascular: Negative.  Negative for chest pain and leg swelling.     BP 122/72   Pulse 70   Resp 20   Ht 6' 1 (1.854 m)   Wt 300 lb (136.1 kg)   SpO2 95% Comment: RA  BMI 39.58 kg/m   Physical Exam Constitutional:       Appearance: Normal appearance.  HENT:     Head: Normocephalic and atraumatic.  Cardiovascular:     Rate and Rhythm: Normal rate and regular rhythm.     Heart sounds: Normal heart sounds, S1 normal and S2 normal.  Pulmonary:     Effort: Pulmonary effort is normal.     Breath sounds: Normal breath sounds.  Skin:    General: Skin is warm and dry.  Neurological:     General: No focal deficit present.     Mental Status: He is alert and oriented to person, place, and time.      Imaging: CLINICAL DATA:  Aortic aneurysm   EXAM: CT ANGIOGRAPHY CHEST WITH CONTRAST   TECHNIQUE: Multidetector CT imaging of the chest was performed using the standard protocol during bolus administration of intravenous contrast. Multiplanar CT image reconstructions and MIPs were obtained to evaluate the vascular anatomy.   RADIATION DOSE REDUCTION: This exam was performed according to the departmental dose-optimization program which includes automated exposure control, adjustment of the mA and/or kV according to patient size and/or use of iterative reconstruction technique.   CONTRAST:  75mL OMNIPAQUE  IOHEXOL  350 MG/ML SOLN   COMPARISON:  Apr 04, 2024 CTA and prior studies   FINDINGS: Cardiovascular: Overall similar size of dilated ascending thoracic  aorta with the mid ascending aorta measuring up to of 4.7 x 4.6 cm, similar to prior. The aortic root measures up to 4.3 cm and the sino-tubular junction measures up to 3.7 cm in diameter. Coronary artery and aortic calcifications.   Mediastinum/Nodes: No lymphadenopathy.   Lungs/Pleura: Unchanged right basilar atelectasis with elevation of the right hemidiaphragm. No pleural effusions. No pneumothorax.   Upper Abdomen: No acute findings.   Musculoskeletal: No acute osseous findings.   Review of the MIP images confirms the above findings.   IMPRESSION: Overall similar size of 4.7 cm ascending thoracic aortic aneurysm. Ascending thoracic aortic  aneurysm. Recommend semi-annual imaging followup by CTA or MRA and referral to cardiothoracic surgery if not already obtained. This recommendation follows 2010 ACCF/AHA/AATS/ACR/ASA/SCA/SCAI/SIR/STS/SVM Guidelines for the Diagnosis and Management of Patients With Thoracic Aortic Disease. Circulation. 2010; 121: Z733-z630. Aortic aneurysm NOS (ICD10-I71.9)   Aortic aneurysm NOS (ICD10-I71.9).     Electronically Signed   By: Michaeline Blanch M.D.   On: 08/30/2024 10:37    A/P: Aneurysm of ascending aorta without rupture - 4.7 cm ascending thoracic aortic aneurysm on CTA of chest.  Echocardiogram showed tricuspid aortic valve. We discussed the natural history and and risk factors for growth of ascending aortic aneurysms. Discussed recommendations to minimize the risk of further expansion or dissection including careful blood pressure control, avoidance of contact sports and heavy lifting, attention to lipid management.  We covered the importance of continued smoking cessation.  The patient does not yet meet surgical criteria of >5.5cm. The patient is aware of signs and symptoms of aortic dissection and when to present to the emergency department   -Follow-up in 6 months with CTA chest for continued surveillance   Risk Modification:  Statin:  atorvastatin    Smoking cessation instruction/counseling given:  commended patient for quitting and reviewed strategies for preventing relapses  Patient was counseled on importance of Blood Pressure Control  They are instructed to contact their Primary Care Physician if they start to have blood pressure readings over 130s/90s. Do not ever stop blood pressure medications on your own, unless instructed by healthcare professional.  Please avoid use of Fluoroquinolones as this can potentially increase your risk of Aortic Rupture and/or Dissection  Patient educated on signs and symptoms of Aortic Dissection, handout also provided in AVS  Manuelita CHRISTELLA Rough,  PA-C 09/13/24

## 2024-09-13 ENCOUNTER — Ambulatory Visit

## 2024-09-13 VITALS — BP 122/72 | HR 70 | Resp 20 | Ht 73.0 in | Wt 300.0 lb

## 2024-09-13 DIAGNOSIS — I7121 Aneurysm of the ascending aorta, without rupture: Secondary | ICD-10-CM

## 2024-09-13 NOTE — Patient Instructions (Signed)

## 2024-09-15 ENCOUNTER — Other Ambulatory Visit: Payer: Self-pay

## 2024-09-15 MED ORDER — ATORVASTATIN CALCIUM 40 MG PO TABS: 40.0000 mg | ORAL_TABLET | Freq: Every day | ORAL | 0 refills | Status: DC

## 2024-09-19 ENCOUNTER — Encounter (INDEPENDENT_AMBULATORY_CARE_PROVIDER_SITE_OTHER): Payer: Self-pay | Admitting: Internal Medicine

## 2024-09-19 ENCOUNTER — Ambulatory Visit (INDEPENDENT_AMBULATORY_CARE_PROVIDER_SITE_OTHER): Admitting: Internal Medicine

## 2024-09-19 VITALS — BP 133/80 | HR 78 | Temp 98.0°F | Ht 73.0 in | Wt 295.0 lb

## 2024-09-19 DIAGNOSIS — Z6838 Body mass index (BMI) 38.0-38.9, adult: Secondary | ICD-10-CM

## 2024-09-19 DIAGNOSIS — R948 Abnormal results of function studies of other organs and systems: Secondary | ICD-10-CM

## 2024-09-19 DIAGNOSIS — G4733 Obstructive sleep apnea (adult) (pediatric): Secondary | ICD-10-CM

## 2024-09-19 DIAGNOSIS — R7303 Prediabetes: Secondary | ICD-10-CM

## 2024-09-19 DIAGNOSIS — I10 Essential (primary) hypertension: Secondary | ICD-10-CM

## 2024-09-19 DIAGNOSIS — E66812 Obesity, class 2: Secondary | ICD-10-CM | POA: Diagnosis not present

## 2024-09-19 NOTE — Assessment & Plan Note (Signed)
 Patient has a slower than predicted metabolism. IC 1800 vs. calculated  2457. This may contribute to weight gain, chronic fatigue and difficulty losing weight.  he is on metoprolol  which may have an effect on metabolic rate.  We reviewed measures to improve metabolism including not skipping meals, progressive strengthening exercises, increasing protein intake at every meal and maintaining adequate hydration and sleep.

## 2024-09-19 NOTE — Assessment & Plan Note (Signed)
 I could not find his sleep study.  He is under the impression that he has moderate sleep apnea.  He is on PAP treatment with good compliance.  Losing 15% of body weight may improve symptoms he may also benefit from GLP-1 aided weight management.  We reviewed the pros and cons of treatment at the present time he wants to continue with ongoing implementation of lifestyle changes

## 2024-09-19 NOTE — Assessment & Plan Note (Signed)
 Most recent A1c is  Lab Results  Component Value Date   HGBA1C 5.7 (H) 08/29/2024    Patient aware of disease state and risk of progression. This may contribute to abnormal cravings, fatigue and diabetic complications without having diabetes.   We have discussed treatment options which include: losing 7 to 10% of body weight, increasing physical activity to a goal of 150 minutes a week at moderate intensity.  Advised to maintain a diet low on simple and processed carbohydrates.  He may also be a candidate for pharmacoprophylaxis with metformin or incretin mimetic.

## 2024-09-19 NOTE — Assessment & Plan Note (Signed)
 Obesity with slow metabolic rate Obesity with a slow metabolic rate, characterized by a decrease in body fat percentage from 42% to 39%. Initial weight loss from 309 lbs to 295 lbs, with recent maintenance but a shift in body composition. Increased physical activity with walking 60 minutes daily. Current caloric intake target is 1500 calories per day, focusing on protein intake. Creatine supplementation identified as a potential cause of fluid retention. Discussion of GLP-1 medications for weight loss, but he prefers to continue current regimen without medication due to potential long-term dependency and cost. GLP-1 medications can aid in weight loss by reducing appetite and have additional benefits such as improving blood pressure, reducing cardiovascular risk, and alleviating arthritis pain. However, they require long-term use to maintain benefits and can be costly if not covered by insurance. - Continue 1500 calorie diet with focus on protein intake - Discontinue creatine supplementation - Use whey isolate or pea protein for protein shakes - Increase tracking of caloric intake using apps like MyFitnessPal - Maintain current physical activity level of 60 minutes walking daily

## 2024-09-19 NOTE — Progress Notes (Signed)
 Office: [phone]  /  Fax: [phone]  Weight Summary and Body Composition Analysis (BIA)  Vitals Temp: 98 F (36.7 C) BP: 133/80 Pulse Rate: 78 SpO2: 97 %   Anthropometric Measurements Height: 6' 1 (1.854 m) Weight: 295 lb (133.8 kg) BMI (Calculated): 38.93 Weight at Last Visit: 295lb Weight Lost Since Last Visit: 0lb Weight Gained Since Last Visit: 0lb Starting Weight: 295lb Total Weight Loss (lbs): 0 lb (0 kg) Peak Weight: 325lb Waist Measurement : 52 inches   Body Composition  Body Fat %: 39.9 % Fat Mass (lbs): 117.56 lbs Muscle Mass (lbs): 168.4 lbs Total Body Water  (lbs): 131.8 lbs Visceral Fat Rating : 27    RMR: 1800  Today's Visit #: 2  Starting Date: 08/29/24   Subjective   Chief Complaint: Obesity  Interval History Discussed the use of AI scribe software for clinical note transcription with the patient, who gave verbal consent to proceed.  History of Present Illness    Discussed the use of AI scribe software for clinical note transcription with the patient, who gave verbal consent to proceed.  History of Present Illness Russell Garrett is a 73 year old male who presents for medical weight management.  He is attending his third visit for medical weight management and has been experiencing challenges in achieving weight loss despite efforts. His target caloric intake is set at 1500 calories, focusing on protein intake. He has noticed a reduction in body fat percentage from 42% to 39% and a weight decrease from 309 lbs to 295 lbs initially, although his weight has since plateaued. His clothes fit looser, and his watch feels looser on his wrist, indicating a change in body composition. He experiences frequent urination and increased water  intake.  He eats out three to four times a week but attempts to make healthier choices, such as opting for salads with grilled chicken and water . He manages high-calorie dressings by having them on the side.  For breakfast, he consumes a chocolate protein shake with 30 grams of protein and a banana. He also uses a supplement containing creatine.  He engages in physical activity, walking for 60 minutes daily, which is an increase from his previous activity level. He uses a pedometer to track his steps and has nearly doubled his physical activity since starting the program. He wants to return to playing golf, which he has not done in three years due to a hip replacement and other life events.  He has a history of sleep apnea and has been using a CPAP machine for over 20 years.   No problems with sleep, and he feels satisfied and full after meals. No issues with appetite suppression.   Challenges affecting patient progress: medical comorbidities and slow metabolism for age.    Pharmacotherapy for weight management: He is currently taking no anti-obesity medication.   Assessment and Plan   Treatment Plan For Obesity:  Recommended Dietary Goals  Russell Garrett is currently in the action stage of change. As such, his goal is to continue weight management plan. He has agreed to: continue current plan  Behavioral Health and Counseling  We discussed the following behavioral modification strategies today: work on tracking and journaling calories using tracking application, continue to work on maintaining a reduced calorie state, getting the recommended amount of protein, incorporating whole foods, making healthy choices, staying well hydrated and practicing mindfulness when eating., and increase protein intake, fibrous foods (25 grams per day for women, 30 grams for men) and water  to  improve satiety and decrease hunger signals. .  Additional education and resources provided today: Handout and personalized instruction on tracking and journaling using Apps  Recommended Physical Activity Goals  Russell Garrett has been advised to work up to 150 minutes of moderate intensity aerobic activity a week and strengthening  exercises 2-3 times per week for cardiovascular health, weight loss maintenance and preservation of muscle mass.  He has agreed to :  Increase volume of physical activity to a goal of 240 minutes a week and Combine aerobic and strengthening exercises for efficiency and improved cardiometabolic health.  Medical Interventions and Pharmacotherapy  We discussed various medication options to help Russell Garrett with his weight loss efforts and we both agreed to : Continue with current nutritional and behavioral strategies, Has declined pharmacotherapy, and Was educated on GLP-1 receptor agonists, their role in managing obesity-related conditions and commonly associated side effects.   Associated Conditions Impacted by Obesity Treatment  Assessment & Plan Essential hypertension, benign Vitals:   09/19/24 1400 09/19/24 1450  BP: (!) 143/76 133/80    Blood pressure is at goal for age and risk category.  On hydrochlorothiazide , losartan  and metoprolol .  Metoprolol  may affect metabolic rate.  Most recent renal parameters show a GFR in the 60s likely due to inadequate fluid intake and the use of ARB and hydrochlorothiazide .  Patient advised on increasing water  intake to at least 90 ounces per day.  Continue with weight loss therapy. Losing 10% may improve blood pressure control. Monitor for symptoms of orthostasis while losing weight. Continue current regimen and home monitoring for a goal blood pressure of < 120/80.   OBSTRUCTIVE SLEEP APNEA I could not find his sleep study.  He is under the impression that he has moderate sleep apnea.  He is on PAP treatment with good compliance.  Losing 15% of body weight may improve symptoms he may also benefit from GLP-1 aided weight management.  We reviewed the pros and cons of treatment at the present time he wants to continue with ongoing implementation of lifestyle changes Class 2 severe obesity with serious comorbidity and body mass index (BMI) of 38.0 to 38.9 in adult,  unspecified obesity type Obesity with slow metabolic rate Obesity with a slow metabolic rate, characterized by a decrease in body fat percentage from 42% to 39%. Initial weight loss from 309 lbs to 295 lbs, with recent maintenance but a shift in body composition. Increased physical activity with walking 60 minutes daily. Current caloric intake target is 1500 calories per day, focusing on protein intake. Creatine supplementation identified as a potential cause of fluid retention. Discussion of GLP-1 medications for weight loss, but he prefers to continue current regimen without medication due to potential long-term dependency and cost. GLP-1 medications can aid in weight loss by reducing appetite and have additional benefits such as improving blood pressure, reducing cardiovascular risk, and alleviating arthritis pain. However, they require long-term use to maintain benefits and can be costly if not covered by insurance. - Continue 1500 calorie diet with focus on protein intake - Discontinue creatine supplementation - Use whey isolate or pea protein for protein shakes - Increase tracking of caloric intake using apps like MyFitnessPal - Maintain current physical activity level of 60 minutes walking daily Prediabetes Most recent A1c is  Lab Results  Component Value Date   HGBA1C 5.7 (H) 08/29/2024    Patient aware of disease state and risk of progression. This may contribute to abnormal cravings, fatigue and diabetic complications without having diabetes.  We have discussed treatment options which include: losing 7 to 10% of body weight, increasing physical activity to a goal of 150 minutes a week at moderate intensity.  Advised to maintain a diet low on simple and processed carbohydrates.  He may also be a candidate for pharmacoprophylaxis with metformin or incretin mimetic.   Abnormal metabolism Patient has a slower than predicted metabolism. IC 1800 vs. calculated  2457. This may contribute  to weight gain, chronic fatigue and difficulty losing weight.  he is on metoprolol  which may have an effect on metabolic rate.  We reviewed measures to improve metabolism including not skipping meals, progressive strengthening exercises, increasing protein intake at every meal and maintaining adequate hydration and sleep.           Objective   Physical Exam:  Blood pressure 133/80, pulse 78, temperature 98 F (36.7 C), height 6' 1 (1.854 m), weight 295 lb (133.8 kg), SpO2 97%. Body mass index is 38.92 kg/m.  General: He is overweight, cooperative, alert, well developed, and in no acute distress. PSYCH: Has normal mood, affect and thought process.   HEENT: EOMI, sclerae are anicteric. Lungs: Normal breathing effort, no conversational dyspnea. Extremities: No edema.  Neurologic: No gross sensory or motor deficits. No tremors or fasciculations noted.    Diagnostic Data Reviewed:  BMET    Component Value Date/Time   NA 140 06/20/2024 0920   K 4.0 06/20/2024 0920   CL 102 06/20/2024 0920   CO2 31 06/20/2024 0920   GLUCOSE 110 (H) 06/20/2024 0920   BUN 17 06/20/2024 0920   CREATININE 1.08 06/20/2024 0920   CALCIUM  9.1 06/20/2024 0920   GFRNONAA >60 11/28/2020 1246   GFRAA >60 09/28/2018 0419   Lab Results  Component Value Date   HGBA1C 5.7 (H) 08/29/2024   Lab Results  Component Value Date   INSULIN 33.0 (H) 08/29/2024   Lab Results  Component Value Date   TSH 2.720 08/29/2024   CBC    Component Value Date/Time   WBC 6.6 06/20/2024 0920   RBC 4.89 06/20/2024 0920   HGB 14.8 06/20/2024 0920   HCT 44.5 06/20/2024 0920   PLT 172.0 06/20/2024 0920   MCV 91.1 06/20/2024 0920   MCH 30.7 11/28/2020 1246   MCHC 33.3 06/20/2024 0920   RDW 13.6 06/20/2024 0920   Iron Studies No results found for: IRON, TIBC, FERRITIN, IRONPCTSAT Lipid Panel     Component Value Date/Time   CHOL 168 06/20/2024 0920   TRIG 88.0 06/20/2024 0920   HDL 45.30 06/20/2024  0920   CHOLHDL 4 06/20/2024 0920   VLDL 17.6 06/20/2024 0920   LDLCALC 105 (H) 06/20/2024 0920   LDLDIRECT 161.3 02/03/2011 1229   Hepatic Function Panel     Component Value Date/Time   PROT 7.1 06/20/2024 0920   ALBUMIN 4.4 06/20/2024 0920   AST 19 06/20/2024 0920   ALT 19 06/20/2024 0920   ALKPHOS 79 06/20/2024 0920   BILITOT 0.9 06/20/2024 0920   BILIDIR 0.2 03/02/2013 0929      Component Value Date/Time   TSH 2.720 08/29/2024 1045   Nutritional Lab Results  Component Value Date   VD25OH 55.1 08/29/2024    Medications: Outpatient Encounter Medications as of 09/19/2024  Medication Sig   atorvastatin  (LIPITOR) 40 MG tablet Take 1 tablet (40 mg total) by mouth daily.   ELIQUIS  5 MG TABS tablet TAKE ONE (1) TABLET BY MOUTH TWO TIMES PER DAY   hydrochlorothiazide  (HYDRODIURIL ) 12.5 MG tablet Take 1  tablet (12.5 mg total) by mouth daily.   losartan  (COZAAR ) 100 MG tablet Take 1 tablet (100 mg total) by mouth daily.   metoprolol  succinate (TOPROL -XL) 25 MG 24 hr tablet TAKE 1 TABLET BY MOUTH ONCE DAILY MUST KEEP APPOINTMENT 04/28/24 FOR MORE REFILLS   OVER THE COUNTER MEDICATION Elderberry/Vitamin C/Zinc   [DISCONTINUED] atorvastatin  (LIPITOR) 40 MG tablet TAKE 1 TABLET BY MOUTH DAILY   [DISCONTINUED] MP MAGNESIUM PO Take by mouth. (Patient not taking: Reported on 08/29/2024)   No facility-administered encounter medications on file as of 09/19/2024.     Follow-Up   Return in about 3 weeks (around 10/10/2024) for For Weight Mangement with Dr. Francyne.SABRA He was informed of the importance of frequent follow up visits to maximize his success with intensive lifestyle modifications for his multiple health conditions.  Attestation Statement   Reviewed by clinician on day of visit: allergies, medications, problem list, medical history, surgical history, family history, social history, and previous encounter notes.   I have spent 35 minutes in the care of the patient today  including: 1 minutes before the visit reviewing and preparing the chart. 29 minutes face-to-face assessing and reviewing listed medical problems as outlined in obesity care plan, providing nutritional and behavioral counseling on topics outlined in the obesity care plan, counseling regarding anti-obesity medication as outlined in obesity care plan, independently interpreting test results and goals of care, as described in assessment and plan, and reviewing and discussing biometric information and progress 5 minutes after the visit updating chart and documentation of encounter.    Lucas Francyne, MD

## 2024-09-19 NOTE — Assessment & Plan Note (Signed)
 Vitals:   09/19/24 1400 09/19/24 1450  BP: (!) 143/76 133/80    Blood pressure is at goal for age and risk category.  On hydrochlorothiazide , losartan  and metoprolol .  Metoprolol  may affect metabolic rate.  Most recent renal parameters show a GFR in the 60s likely due to inadequate fluid intake and the use of ARB and hydrochlorothiazide .  Patient advised on increasing water  intake to at least 90 ounces per day.  Continue with weight loss therapy. Losing 10% may improve blood pressure control. Monitor for symptoms of orthostasis while losing weight. Continue current regimen and home monitoring for a goal blood pressure of < 120/80.

## 2024-10-10 DIAGNOSIS — H2511 Age-related nuclear cataract, right eye: Secondary | ICD-10-CM | POA: Diagnosis not present

## 2024-10-10 DIAGNOSIS — H25011 Cortical age-related cataract, right eye: Secondary | ICD-10-CM | POA: Diagnosis not present

## 2024-10-10 DIAGNOSIS — Z961 Presence of intraocular lens: Secondary | ICD-10-CM | POA: Diagnosis not present

## 2024-10-10 DIAGNOSIS — H25811 Combined forms of age-related cataract, right eye: Secondary | ICD-10-CM | POA: Diagnosis not present

## 2024-10-10 DIAGNOSIS — H278 Other specified disorders of lens: Secondary | ICD-10-CM | POA: Diagnosis not present

## 2024-10-10 DIAGNOSIS — I10 Essential (primary) hypertension: Secondary | ICD-10-CM | POA: Diagnosis not present

## 2024-10-24 ENCOUNTER — Telehealth: Payer: Self-pay

## 2024-10-24 NOTE — Telephone Encounter (Signed)
 Copied from CRM #[id number]. Topic: Clinical - Medication Question >> Oct 24, 2024 12:29 PM Russell Garrett wrote: Reason for CRM: Wife called for patient..wants to know if they can get a script for Russell Garrett.. for Tamiflu - they are going on a trip leaving Thursday.  More of a precaution she said ( she said to call her if we are unable )

## 2024-10-25 ENCOUNTER — Encounter (INDEPENDENT_AMBULATORY_CARE_PROVIDER_SITE_OTHER): Payer: Self-pay | Admitting: Internal Medicine

## 2024-10-25 ENCOUNTER — Ambulatory Visit (INDEPENDENT_AMBULATORY_CARE_PROVIDER_SITE_OTHER): Payer: Self-pay | Admitting: Internal Medicine

## 2024-10-25 VITALS — BP 152/80 | HR 60 | Temp 98.2°F | Ht 73.0 in | Wt 289.0 lb

## 2024-10-25 DIAGNOSIS — R7303 Prediabetes: Secondary | ICD-10-CM | POA: Diagnosis not present

## 2024-10-25 DIAGNOSIS — R948 Abnormal results of function studies of other organs and systems: Secondary | ICD-10-CM | POA: Diagnosis not present

## 2024-10-25 DIAGNOSIS — G4733 Obstructive sleep apnea (adult) (pediatric): Secondary | ICD-10-CM | POA: Diagnosis not present

## 2024-10-25 DIAGNOSIS — Z6838 Body mass index (BMI) 38.0-38.9, adult: Secondary | ICD-10-CM | POA: Diagnosis not present

## 2024-10-25 DIAGNOSIS — E66812 Obesity, class 2: Secondary | ICD-10-CM

## 2024-10-25 NOTE — Progress Notes (Unsigned)
 Office: [phone]  /  Fax: [phone]  Weight Summary and Body Composition Analysis (BIA)  Vitals Temp: 98.2 F (36.8 C) BP: (!) 152/80 Pulse Rate: 60 SpO2: 96 %   Anthropometric Measurements Height: 6' 1 (1.854 m) Weight: 289 lb (131.1 kg) BMI (Calculated): 38.14 Weight at Last Visit: 295 lb Weight Lost Since Last Visit: 6 lb Weight Gained Since Last Visit: 0 lb Starting Weight: 295 lb Total Weight Loss (lbs): 6 lb (2.722 kg) Peak Weight: 325 lb   Body Composition  Body Fat %: 40 % Fat Mass (lbs): 115.6 lbs Muscle Mass (lbs): 165 lbs Total Body Water  (lbs): 133.2 lbs Visceral Fat Rating : 26    RMR: 1800  Today's Visit #: 3  Starting Date: 08/29/24   Subjective   Chief Complaint: Obesity  Interval History Discussed the use of AI scribe software for clinical note transcription with the patient, who gave verbal consent to proceed.  History of Present Illness Russell Garrett is a 73 year old male who presents for obesity and weight management follow-up.  He has achieved a recent weight loss of six pounds through dietary modifications. He tracks his caloric intake using Excel spreadsheets, aiming for a daily intake of 1500 calories. His carbohydrate intake is generally below 150 grams per day, aligning with a low-carb diet. He incorporates foods like nuts, olive oil, avocado, and fatty fish into his diet to maintain protein and healthy fat intake.  His appetite control is good, and he does not feel deprived or excessively hungry. He occasionally eats snacks to meet his caloric goals but does not feel the need to eat between meals. He feels hungry in the mornings, which he considers a positive sign.  He uses a CPAP machine every night as part of his routine. He has not been drinking much alcohol, except for occasional margaritas when dining out. He avoids simple carbohydrates like crackers and white bread, opting for complex carbohydrates found in fruits and  starchy vegetables.  He has noticed improvements in his physical activity levels, walking 1.8 to 2 miles daily, and plans to increase his activity once the weather improves. He feels less winded during physical activities, such as walking around a large stadium recently without difficulty.  He has a history of cataract surgery, which has improved his vision, allowing him to stop wearing glasses. He also mentions a history of leg cramps, which he managed by incorporating bananas into his diet, although he has not experienced cramps in the past two days.     Challenges affecting patient progress: none.    Pharmacotherapy for weight management: He is currently taking no anti-obesity medication.   Assessment and Plan   Treatment Plan For Obesity:  Recommended Dietary Goals  Tarell is currently in the action stage of change. As such, his goal is to continue weight management plan. He has agreed to: continue current plan  Behavioral Health and Counseling  We discussed the following behavioral modification strategies today: decreasing simple carbohydrates , work on tracking and journaling calories using tracking application, continue to work on maintaining a reduced calorie state, getting the recommended amount of protein, incorporating whole foods, making healthy choices, staying well hydrated and practicing mindfulness when eating., and increase protein intake, fibrous foods (25 grams per day for women, 30 grams for men) and water  to improve satiety and decrease hunger signals. .  Additional education and resources provided today: Handout on traveling and holiday eating strategies  Recommended Physical Activity Goals  Alverna  has been advised to work up to 150 minutes of moderate intensity aerobic activity a week and strengthening exercises 2-3 times per week for cardiovascular health, weight loss maintenance and preservation of muscle mass.  He has agreed to :  Think about enjoyable ways to  increase daily physical activity and overcoming barriers to exercise, Increase physical activity in their day and reduce sedentary time (increase NEAT)., Increase volume of physical activity to a goal of 240 minutes a week, and Combine aerobic and strengthening exercises for efficiency and improved cardiometabolic health.  Medical Interventions and Pharmacotherapy  We discussed various medication options to help Tovia with his weight loss efforts and we both agreed to : Continue with current nutritional and behavioral strategies and Was educated on GLP-1 receptor agonists, their role in managing obesity-related conditions and commonly associated side effects.   Associated Conditions Impacted by Obesity Treatment  Assessment & Plan Prediabetes Most recent A1c is  Lab Results  Component Value Date   HGBA1C 5.7 (H) 08/29/2024    Patient aware of disease state and risk of progression. This may contribute to abnormal cravings, fatigue and diabetic complications without having diabetes.   We have discussed treatment options which include: losing 7 to 10% of body weight, increasing physical activity to a goal of 150 minutes a week at moderate intensity.  Advised to maintain a diet low on simple and processed carbohydrates.  He may also be a candidate for pharmacoprophylaxis with metformin or incretin mimetic.   Abnormal metabolism Patient has a slower than predicted metabolism. IC 1800 vs. calculated  2457. This may contribute to weight gain, chronic fatigue and difficulty losing weight.  he is on metoprolol  which may have an effect on metabolic rate.  We have reviewed measures to improve metabolism including not skipping meals, progressive strengthening exercises, increasing protein intake at every meal and maintaining adequate hydration and sleep.   Class 2 severe obesity with serious comorbidity and body mass index (BMI) of 38.0 to 38.9 in adult, unspecified obesity type Management is  progressing well with a recent weight loss of six pounds. He is following a low-carb diet with a target of 1500 calories per day, maintaining a balance of 30% protein, 40% carbs, and 30% healthy fats. He is using Excel to track his caloric intake and is mindful of his carbohydrate intake, aiming for less than 150 grams per day. He reports no issues with appetite control and is confident in his dietary choices. He is also engaging in regular physical activity, walking 1.8 to 2 miles daily, and plans to resume golfing and other activities as weather permits. He is aware of the importance of maintaining a healthy lifestyle to improve longevity and quality of life. - Continue current dietary plan with a target of 1500 calories per day, maintaining 30% protein, 40% carbs, and 30% healthy fats. - Continue regular physical activity, aiming for 1.8 to 2 miles of walking daily. - Provided handouts on staying on track while traveling and during the holidays. - Will schedule follow-up appointment in four weeks. OBSTRUCTIVE SLEEP APNEA Moderate patient.  Continue PAP therapy.  Losing 15% of body weight may reduce AHI.  He may also be a candidate for GLP-1 aided weight management prophylactic with continue with nutritional behavioral strategies.         Objective   Physical Exam:  Blood pressure (!) 152/80, pulse 60, temperature 98.2 F (36.8 C), height 6' 1 (1.854 m), weight 289 lb (131.1 kg), SpO2 96%. Body mass index  is 38.13 kg/m.  General: He is overweight, cooperative, alert, well developed, and in no acute distress. PSYCH: Has normal mood, affect and thought process.   HEENT: EOMI, sclerae are anicteric. Lungs: Normal breathing effort, no conversational dyspnea. Extremities: No edema.  Neurologic: No gross sensory or motor deficits. No tremors or fasciculations noted.    Diagnostic Data Reviewed:  BMET    Component Value Date/Time   NA 140 06/20/2024 0920   K 4.0 06/20/2024 0920   CL  102 06/20/2024 0920   CO2 31 06/20/2024 0920   GLUCOSE 110 (H) 06/20/2024 0920   BUN 17 06/20/2024 0920   CREATININE 1.08 06/20/2024 0920   CALCIUM  9.1 06/20/2024 0920   GFRNONAA >60 11/28/2020 1246   GFRAA >60 09/28/2018 0419   Lab Results  Component Value Date   HGBA1C 5.7 (H) 08/29/2024   Lab Results  Component Value Date   INSULIN  33.0 (H) 08/29/2024   Lab Results  Component Value Date   TSH 2.720 08/29/2024   CBC    Component Value Date/Time   WBC 6.6 06/20/2024 0920   RBC 4.89 06/20/2024 0920   HGB 14.8 06/20/2024 0920   HCT 44.5 06/20/2024 0920   PLT 172.0 06/20/2024 0920   MCV 91.1 06/20/2024 0920   MCH 30.7 11/28/2020 1246   MCHC 33.3 06/20/2024 0920   RDW 13.6 06/20/2024 0920   Iron Studies No results found for: IRON, TIBC, FERRITIN, IRONPCTSAT Lipid Panel     Component Value Date/Time   CHOL 168 06/20/2024 0920   TRIG 88.0 06/20/2024 0920   HDL 45.30 06/20/2024 0920   CHOLHDL 4 06/20/2024 0920   VLDL 17.6 06/20/2024 0920   LDLCALC 105 (H) 06/20/2024 0920   LDLDIRECT 161.3 02/03/2011 1229   Hepatic Function Panel     Component Value Date/Time   PROT 7.1 06/20/2024 0920   ALBUMIN 4.4 06/20/2024 0920   AST 19 06/20/2024 0920   ALT 19 06/20/2024 0920   ALKPHOS 79 06/20/2024 0920   BILITOT 0.9 06/20/2024 0920   BILIDIR 0.2 03/02/2013 0929      Component Value Date/Time   TSH 2.720 08/29/2024 1045   Nutritional Lab Results  Component Value Date   VD25OH 55.1 08/29/2024    Medications: Outpatient Encounter Medications as of 10/25/2024  Medication Sig   atorvastatin  (LIPITOR) 40 MG tablet Take 1 tablet (40 mg total) by mouth daily.   ELIQUIS  5 MG TABS tablet TAKE ONE (1) TABLET BY MOUTH TWO TIMES PER DAY   hydrochlorothiazide  (HYDRODIURIL ) 12.5 MG tablet Take 1 tablet (12.5 mg total) by mouth daily.   losartan  (COZAAR ) 100 MG tablet Take 1 tablet (100 mg total) by mouth daily.   metoprolol  succinate (TOPROL -XL) 25 MG 24 hr tablet  TAKE 1 TABLET BY MOUTH ONCE DAILY MUST KEEP APPOINTMENT 04/28/24 FOR MORE REFILLS   OVER THE COUNTER MEDICATION Elderberry/Vitamin C/Zinc   No facility-administered encounter medications on file as of 10/25/2024.     Follow-Up   Return in about 4 weeks (around 11/22/2024) for For Weight Mangement with Dr. Francyne.SABRA He was informed of the importance of frequent follow up visits to maximize his success with intensive lifestyle modifications for his multiple health conditions.  Attestation Statement   Reviewed by clinician on day of visit: allergies, medications, problem list, medical history, surgical history, family history, social history, and previous encounter notes.     Lucas Francyne, MD

## 2024-10-26 ENCOUNTER — Encounter: Payer: Self-pay | Admitting: Internal Medicine

## 2024-10-26 ENCOUNTER — Ambulatory Visit: Admitting: Internal Medicine

## 2024-10-26 VITALS — BP 116/70 | HR 82 | Temp 97.5°F | Ht 73.0 in | Wt 289.0 lb

## 2024-10-26 DIAGNOSIS — Z87891 Personal history of nicotine dependence: Secondary | ICD-10-CM | POA: Diagnosis not present

## 2024-10-26 DIAGNOSIS — G4733 Obstructive sleep apnea (adult) (pediatric): Secondary | ICD-10-CM | POA: Diagnosis not present

## 2024-10-26 DIAGNOSIS — Z86711 Personal history of pulmonary embolism: Secondary | ICD-10-CM | POA: Diagnosis not present

## 2024-10-26 DIAGNOSIS — Z23 Encounter for immunization: Secondary | ICD-10-CM | POA: Diagnosis not present

## 2024-10-26 DIAGNOSIS — I2699 Other pulmonary embolism without acute cor pulmonale: Secondary | ICD-10-CM | POA: Diagnosis not present

## 2024-10-26 MED ORDER — APIXABAN 2.5 MG PO TABS: 2.5000 mg | ORAL_TABLET | Freq: Two times a day (BID) | ORAL | 11 refills | Status: AC

## 2024-10-26 NOTE — Assessment & Plan Note (Signed)
 Management is progressing well with a recent weight loss of six pounds. He is following a low-carb diet with a target of 1500 calories per day, maintaining a balance of 30% protein, 40% carbs, and 30% healthy fats. He is using Excel to track his caloric intake and is mindful of his carbohydrate intake, aiming for less than 150 grams per day. He reports no issues with appetite control and is confident in his dietary choices. He is also engaging in regular physical activity, walking 1.8 to 2 miles daily, and plans to resume golfing and other activities as weather permits. He is aware of the importance of maintaining a healthy lifestyle to improve longevity and quality of life. - Continue current dietary plan with a target of 1500 calories per day, maintaining 30% protein, 40% carbs, and 30% healthy fats. - Continue regular physical activity, aiming for 1.8 to 2 miles of walking daily. - Provided handouts on staying on track while traveling and during the holidays. - Will schedule follow-up appointment in four weeks.

## 2024-10-26 NOTE — Assessment & Plan Note (Signed)
 Patient has a slower than predicted metabolism. IC 1800 vs. calculated  2457. This may contribute to weight gain, chronic fatigue and difficulty losing weight.  he is on metoprolol  which may have an effect on metabolic rate.  We have reviewed measures to improve metabolism including not skipping meals, progressive strengthening exercises, increasing protein intake at every meal and maintaining adequate hydration and sleep.

## 2024-10-26 NOTE — Assessment & Plan Note (Signed)
 Moderate patient.  Continue PAP therapy.  Losing 15% of body weight may reduce AHI.  He may also be a candidate for GLP-1 aided weight management prophylactic with continue with nutritional behavioral strategies.

## 2024-10-26 NOTE — Progress Notes (Signed)
 Russell Garrett    [id number]    Feb 18, 1951  Primary Care Physician:Letvak, Charlie FERNS, MD Date of Appointment: 10/26/2024 Established Patient Visit  Chief complaint:   Chief Complaint  Patient presents with   Sleep Apnea    No problems with C-pap machine.     HPI: Russell Garrett is a 73 y.o. man with OSA on CPAP. History of pulmonary embolism   Interval Updates: Here for CPAP follow up  Cpap download reviewed 100% adherence auto cpap 10-20 cm H20 AHI 0.2 minimal leak median pressure 10.5 cm H20.  Feels well and benefits from treatment.   He has been referred to [facility].  Denies dyspnea, chest pain.   Has history of PE 5-6 years ago. Has been on eliquis  5 mg BID. We talked about reducing to ppx dosing but PCP hasn't done this yet.   Hasn't had flu shot yet.    Social Hx: Retired from working in a immunologist. Lives in [place]. Lives at home with wife, pet dog. FIL recently went into memory care.  30+ pack year smoking history. Quit in 2017.  I have reviewed the patient's family social and past medical history and updated as appropriate.   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   Diverticulitis    pt denies   Edema of both lower extremities    Hamstring muscle strain 2024   Left Leg   Hyperlipidemia    Hypertension    Mild ascending aorta dilatation    4.8cm in diamter per CT chest, see imaging in epic dated 02-25-18   Obesity    OSA (obstructive sleep apnea)    Osteoarthritis    Pulmonary air embolism (HCC) 02/2018   lifetime eliquis  since then    Sleep apnea    CPAP   Thoracic aortic aneurysm     Past Surgical History:  Procedure Laterality Date   BACK SURGERY     COLONOSCOPY     REFRACTIVE SURGERY Right 07/25/2022   THORACENTESIS  08/2006   left pleural effusion   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    Procedure: RIGHT TOTAL HIP ARTHROPLASTY ANTERIOR APPROACH;  Surgeon: Melodi Lerner, MD;  Location: [facility];  Service: Orthopedics;  Laterality: Right;     Family History  Problem Relation Age of Onset   Hypertension Mother    Heart disease Mother    Kidney disease Mother    Eating disorder Mother    Obesity Mother    Hyperlipidemia Mother    Heart disease Father    Stroke Father    Prostate cancer Father    Kidney disease Father    Hypertension Father    Hyperlipidemia Father    Heart disease Brother        cardioversion for arrhythmia   Diabetes Neg Hx    Colon cancer Neg Hx     Social History   Occupational History   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  Tobacco Use   Smoking status: Former    Current packs/day: 0.00    Average packs/day: 1 pack/day for 35.0 years (35.0 ttl pk-yrs)    Types: Cigarettes    Start date: 12/15/1980    Quit date: 12/16/2015    Years since quitting: 8.8    Passive  exposure: Never   Smokeless tobacco: Never  Vaping Use   Vaping status: Never Used  Substance and Sexual Activity   Alcohol use: Yes    Alcohol/week: 0.0 standard drinks of alcohol    Comment: occ   Drug use: No   Sexual activity: Not on file     Physical Exam: Blood pressure 116/70, pulse 82, temperature (!) 97.5 F (36.4 C), temperature source Oral, height 6' 1 (1.854 m), weight 289 lb (131.1 kg), SpO2 96%.  Gen:      No distress, obese Lungs:    diminished, no wheeze CV:         RRR no mrg   Data Reviewed: Imaging: I have personally reviewed the CT Chest June 2022 which shows stable RLL  post infectious scarring and a ascending aortic aneurysm. No emphysema. No nodules.   PFTs:      Latest Ref Rng & Units 09/16/2021    3:46 PM  PFT Results  FVC-Pre L 2.81   FVC-Predicted Pre % 53   FVC-Post L 2.78   FVC-Predicted Post % 52   Pre FEV1/FVC % % 81   Post FEV1/FCV % % 80   FEV1-Pre L 2.28   FEV1-Predicted Pre % 58   FEV1-Post L 2.23    DLCO uncorrected ml/min/mmHg 24.33   DLCO UNC% % 80   DLCO corrected ml/min/mmHg 24.33   DLCO COR %Predicted % 80   DLVA Predicted % 141   TLC L 5.28   TLC % Predicted % 65   RV % Predicted % 92    I have personally reviewed the patient's PFTs and moderately severe restriction to ventilation secondary to body habitus.   Labs: Lab Results  Component Value Date   WBC 6.6 06/20/2024   HGB 14.8 06/20/2024   HCT 44.5 06/20/2024   MCV 91.1 06/20/2024   PLT 172.0 06/20/2024   Lab Results  Component Value Date   NA 140 06/20/2024   K 4.0 06/20/2024   CL 102 06/20/2024   CO2 31 06/20/2024    Immunization status: Immunization History  Administered Date(s) Administered   Fluad Quad(high Dose 65+) 07/27/2019, 08/16/2020, 08/28/2021, 10/03/2022   Fluad Trivalent(High Dose 65+) 10/08/2023   H1N1 02/09/2009   INFLUENZA, HIGH DOSE SEASONAL PF 10/26/2024   Influenza Split 08/06/2011, 08/18/2012, 10/09/2015   Influenza Whole 08/16/2010   Influenza,inj,Quad PF,6+ Mos 09/20/2013, 10/04/2016, 09/25/2018   Influenza,inj,quad, With Preservative 11/23/2017   Influenza-Unspecified 09/24/2016   PFIZER(Purple Top)SARS-COV-2 Vaccination 01/07/2020, 01/31/2020, 11/22/2020   Pneumococcal Conjugate-13 03/17/2017   Pneumococcal Polysaccharide-23 03/19/2018   Pneumococcal-Unspecified 02/22/2013   Td 12/06/1996, 01/25/2008   Tdap 03/19/2018   Tetanus 11/24/2016   Zoster Recombinant(Shingrix) 05/02/2021, 07/22/2021   Zoster, Live 03/11/2013    Assessment:  OSA on CPAP - well controlled Ascending Aortic Aneurysm History of Tobacco use History of pulmonary embolism around 2019-2020 Need for influenza vaccine  Plan/Recommendations: CPAP report looks great. Continue current settings.   Call me if you need me sooner.   We will give you the flu shot today.   We have adjust your eliquis  to 1/2 dose, prophylaxis dosing 2.5 mg twice a day.   Return to Care: Return in about 1 year (around  10/26/2025) for Dr Theodoro.  Verdon Gore, MD Pulmonary and Critical Care Medicine [facility] Office:[phone]

## 2024-10-26 NOTE — Patient Instructions (Addendum)
 It was a pleasure to see you today!  Please schedule follow up with Dr. Pawar in 1 year. Please call sooner [phone] if issues or concerns arise. You can also send us  a message through MyChart, but but aware that this is not to be used for urgent issues and it may take up to 5-7 days to receive a reply. Please be aware that you will likely be able to view your results before I have a chance to respond to them. Please give us  5 business days to respond to any non-urgent results.    CPAP report looks great. Continue current settings.   Call me if you need me sooner.   We will give you the flu shot today.   We have adjust your eliquis  to 1/2 dose, prophylaxis dosing 2.5 mg twice a day.

## 2024-10-26 NOTE — Assessment & Plan Note (Signed)
 Most recent A1c is  Lab Results  Component Value Date   HGBA1C 5.7 (H) 08/29/2024    Patient aware of disease state and risk of progression. This may contribute to abnormal cravings, fatigue and diabetic complications without having diabetes.   We have discussed treatment options which include: losing 7 to 10% of body weight, increasing physical activity to a goal of 150 minutes a week at moderate intensity.  Advised to maintain a diet low on simple and processed carbohydrates.  He may also be a candidate for pharmacoprophylaxis with metformin or incretin mimetic.

## 2024-11-07 ENCOUNTER — Other Ambulatory Visit: Payer: Self-pay | Admitting: Cardiovascular Disease

## 2024-11-29 ENCOUNTER — Ambulatory Visit (INDEPENDENT_AMBULATORY_CARE_PROVIDER_SITE_OTHER): Admitting: Internal Medicine

## 2024-11-29 ENCOUNTER — Encounter (INDEPENDENT_AMBULATORY_CARE_PROVIDER_SITE_OTHER): Payer: Self-pay | Admitting: Internal Medicine

## 2024-11-29 VITALS — BP 129/72 | HR 51 | Temp 97.5°F | Ht 73.0 in | Wt 283.0 lb

## 2024-11-29 DIAGNOSIS — R948 Abnormal results of function studies of other organs and systems: Secondary | ICD-10-CM | POA: Diagnosis not present

## 2024-11-29 DIAGNOSIS — G4733 Obstructive sleep apnea (adult) (pediatric): Secondary | ICD-10-CM

## 2024-11-29 DIAGNOSIS — Z6838 Body mass index (BMI) 38.0-38.9, adult: Secondary | ICD-10-CM | POA: Diagnosis not present

## 2024-11-29 DIAGNOSIS — R195 Other fecal abnormalities: Secondary | ICD-10-CM

## 2024-11-29 DIAGNOSIS — E66812 Obesity, class 2: Secondary | ICD-10-CM | POA: Diagnosis not present

## 2024-11-29 DIAGNOSIS — R7303 Prediabetes: Secondary | ICD-10-CM | POA: Diagnosis not present

## 2024-11-29 NOTE — Progress Notes (Unsigned)
 "  Office: [phone]  /  Fax: [phone]  Weight Summary and Body Composition Analysis (BIA)  Vitals Temp: (!) 97.5 F (36.4 C) BP: 129/72 Pulse Rate: (!) 51 SpO2: 97 %   Anthropometric Measurements Height: 6' 1 (1.854 m) Weight: 283 lb (128.4 kg) BMI (Calculated): 37.35 Weight at Last Visit: 289 lb Weight Lost Since Last Visit: 6 lb Weight Gained Since Last Visit: 0 Starting Weight: 295 lb Total Weight Loss (lbs): 12 lb (5.443 kg) Peak Weight: 325 lb   Body Composition  Body Fat %: 39.5 % Fat Mass (lbs): 112 lbs Muscle Mass (lbs): 163 lbs Total Body Water  (lbs): 127.8 lbs Visceral Fat Rating : 26    RMR: 1800  Today's Visit #: 4  Starting Date: 08/29/24   Subjective   Chief Complaint: Obesity  Interval History  Discussed the use of AI scribe software for clinical note transcription with the patient, who gave verbal consent to proceed.  History of Present Illness Russell Garrett is a 74 year old male who presents for medical weight management.  Since his last visit, he has lost six pounds, which he attributes to maintaining a balanced diet over the holidays. He occasionally eats cookies but did not overindulge. He is proud of his weight loss and notes visible changes in his body composition, such as a reduction in clothing size. He currently consumes around 1200 calories per day, which is below the 1500 calorie target, and engages in regular physical activity, including walking two miles a day. He wears an extra-large shirt now, down from a 2XL, and his pants are too big, but he is hesitant to buy new ones.  He experiences chronic diarrhea, which he associates with the consumption of protein shakes containing artificial sweeteners. He initially used [employer] but switched to [employer] due to sickness, and now experiences diarrhea with [employer] as well. He did not consume protein shakes on Sunday and Monday and noted an improvement in symptoms.  He reports no  issues with sleep, using a CPAP machine effectively. His blood pressure readings vary depending on which arm is used, but his ankles are less swollen than before. He drinks water  with most meals but acknowledges he might not be drinking enough overall.     Challenges affecting patient progress: none.    Pharmacotherapy for weight management: He is currently taking no anti-obesity medication.   Assessment and Plan   Treatment Plan For Obesity:  Recommended Dietary Goals  Russell Garrett is currently in the action stage of change. As such, his goal is to continue weight management plan. He has agreed to: continue current plan  Behavioral Health and Counseling  We discussed the following behavioral modification strategies today: continue to work on maintaining a reduced calorie state, getting the recommended amount of protein, incorporating whole foods, making healthy choices, staying well hydrated and practicing mindfulness when eating. and increase protein intake, fibrous foods (25 grams per day for women, 30 grams for men) and water  to improve satiety and decrease hunger signals. .  Additional education and resources provided today: None  Recommended Physical Activity Goals  Russell Garrett has been advised to work up to 150 minutes of moderate intensity aerobic activity a week and strengthening exercises 2-3 times per week for cardiovascular health, weight loss maintenance and preservation of muscle mass.  He has agreed to :  Think about enjoyable ways to increase daily physical activity and overcoming barriers to exercise, Increase physical activity in their day and reduce sedentary time (increase NEAT).,  Increase volume of physical activity to a goal of 240 minutes a week, and Combine aerobic and strengthening exercises for efficiency and improved cardiometabolic health.  Medical Interventions and Pharmacotherapy  We discussed various medication options to help Russell Garrett with his weight loss efforts and we  both agreed to : Continue with current nutritional and behavioral strategies  Associated Conditions Impacted by Obesity Treatment  Assessment & Plan OBSTRUCTIVE SLEEP APNEA Moderate patient.  Continue PAP therapy.  Losing 15% of body weight may reduce AHI.  He may also be a candidate for GLP-1 aided weight management prophylactic with continue with nutritional behavioral strategies. Class 2 severe obesity with serious comorbidity and body mass index (BMI) of 38.0 to 38.9 in adult, unspecified obesity type Management is ongoing with significant progress. He has lost six pounds since the last visit, with a reduction in body fat percentage from 42% to 39% and visceral fat from 30% to 25%. He is walking two miles a day and making healthier dietary choices, including reducing sodium intake and opting for plant-based protein shakes without artificial sweeteners. He reports improved energy levels and adequate appetite control, consuming approximately 1200 calories per day. No current need for pharmacological intervention as he is managing well with lifestyle modifications. - Continue current dietary and exercise regimen. - Scheduled follow-up appointment in four weeks for accountability and progress check. - Consider using a weighted vest for increased exercise intensity. Prediabetes Most recent A1c is  Lab Results  Component Value Date   HGBA1C 5.7 (H) 08/29/2024    Patient aware of disease state and risk of progression. This may contribute to abnormal cravings, fatigue and diabetic complications without having diabetes.   We have discussed treatment options which include: losing 7 to 10% of body weight, increasing physical activity to a goal of 150 minutes a week at moderate intensity.  Advised to maintain a diet low on simple and processed carbohydrates.  He may also be a candidate for pharmacoprophylaxis with metformin or incretin mimetic.   Abnormal metabolism Patient has a slower than  predicted metabolism. IC 1800 vs. calculated  2457. This may contribute to weight gain, chronic fatigue and difficulty losing weight.  he is on metoprolol  which may have an effect on metabolic rate.  We have reviewed measures to improve metabolism including not skipping meals, progressive strengthening exercises, increasing protein intake at every meal and maintaining adequate hydration and sleep.   Loose stools Likely due to artificial sweeteners found in some protein shakes.  Advised to use protein shakes to have either Stevia or monk fruit         Objective   Physical Exam:  Blood pressure 129/72, pulse (!) 51, temperature (!) 97.5 F (36.4 C), height 6' 1 (1.854 m), weight 283 lb (128.4 kg), SpO2 97%. Body mass index is 37.34 kg/m.  General: He is overweight, cooperative, alert, well developed, and in no acute distress. PSYCH: Has normal mood, affect and thought process.   HEENT: EOMI, sclerae are anicteric. Lungs: Normal breathing effort, no conversational dyspnea. Extremities: No edema.  Neurologic: No gross sensory or motor deficits. No tremors or fasciculations noted.    Diagnostic Data Reviewed:  BMET    Component Value Date/Time   NA 140 06/20/2024 0920   K 4.0 06/20/2024 0920   CL 102 06/20/2024 0920   CO2 31 06/20/2024 0920   GLUCOSE 110 (H) 06/20/2024 0920   BUN 17 06/20/2024 0920   CREATININE 1.08 06/20/2024 0920   CALCIUM  9.1 06/20/2024 0920  GFRNONAA >60 11/28/2020 1246   GFRAA >60 09/28/2018 0419   Lab Results  Component Value Date   HGBA1C 5.7 (H) 08/29/2024   Lab Results  Component Value Date   INSULIN  33.0 (H) 08/29/2024   Lab Results  Component Value Date   TSH 2.720 08/29/2024   CBC    Component Value Date/Time   WBC 6.6 06/20/2024 0920   RBC 4.89 06/20/2024 0920   HGB 14.8 06/20/2024 0920   HCT 44.5 06/20/2024 0920   PLT 172.0 06/20/2024 0920   MCV 91.1 06/20/2024 0920   MCH 30.7 11/28/2020 1246   MCHC 33.3 06/20/2024 0920    RDW 13.6 06/20/2024 0920   Iron Studies No results found for: IRON, TIBC, FERRITIN, IRONPCTSAT Lipid Panel     Component Value Date/Time   CHOL 168 06/20/2024 0920   TRIG 88.0 06/20/2024 0920   HDL 45.30 06/20/2024 0920   CHOLHDL 4 06/20/2024 0920   VLDL 17.6 06/20/2024 0920   LDLCALC 105 (H) 06/20/2024 0920   LDLDIRECT 161.3 02/03/2011 1229   Hepatic Function Panel     Component Value Date/Time   PROT 7.1 06/20/2024 0920   ALBUMIN 4.4 06/20/2024 0920   AST 19 06/20/2024 0920   ALT 19 06/20/2024 0920   ALKPHOS 79 06/20/2024 0920   BILITOT 0.9 06/20/2024 0920   BILIDIR 0.2 03/02/2013 0929      Component Value Date/Time   TSH 2.720 08/29/2024 1045   Nutritional Lab Results  Component Value Date   VD25OH 55.1 08/29/2024    Medications: Outpatient Encounter Medications as of 11/29/2024  Medication Sig   apixaban  (ELIQUIS ) 2.5 MG TABS tablet Take 1 tablet (2.5 mg total) by mouth 2 (two) times daily.   atorvastatin  (LIPITOR) 40 MG tablet Take 1 tablet (40 mg total) by mouth daily.   hydrochlorothiazide  (HYDRODIURIL ) 12.5 MG tablet Take 1 tablet (12.5 mg total) by mouth daily.   losartan  (COZAAR ) 100 MG tablet Take 1 tablet (100 mg total) by mouth daily.   metoprolol  succinate (TOPROL -XL) 25 MG 24 hr tablet TAKE 1 TABLET BY MOUTH ONCE DAILY MUST KEEP APPOINTMENT 04/28/24 FOR MORE REFILLS   OVER THE COUNTER MEDICATION Elderberry/Vitamin C/Zinc   No facility-administered encounter medications on file as of 11/29/2024.     Follow-Up   Return in about 4 weeks (around 12/27/2024) for For Weight Mangement with Dr. Francyne.SABRA He was informed of the importance of frequent follow up visits to maximize his success with intensive lifestyle modifications for his multiple health conditions.  Attestation Statement   Reviewed by clinician on day of visit: allergies, medications, problem list, medical history, surgical history, family history, social history, and previous encounter  notes.     Lucas Francyne, MD  "

## 2024-11-30 DIAGNOSIS — R195 Other fecal abnormalities: Secondary | ICD-10-CM | POA: Insufficient documentation

## 2024-11-30 NOTE — Assessment & Plan Note (Signed)
 Patient has a slower than predicted metabolism. IC 1800 vs. calculated  2457. This may contribute to weight gain, chronic fatigue and difficulty losing weight.  he is on metoprolol  which may have an effect on metabolic rate.  We have reviewed measures to improve metabolism including not skipping meals, progressive strengthening exercises, increasing protein intake at every meal and maintaining adequate hydration and sleep.

## 2024-11-30 NOTE — Assessment & Plan Note (Signed)
 Likely due to artificial sweeteners found in some protein shakes.  Advised to use protein shakes to have either Stevia or monk fruit

## 2024-11-30 NOTE — Assessment & Plan Note (Signed)
 Most recent A1c is  Lab Results  Component Value Date   HGBA1C 5.7 (H) 08/29/2024    Patient aware of disease state and risk of progression. This may contribute to abnormal cravings, fatigue and diabetic complications without having diabetes.   We have discussed treatment options which include: losing 7 to 10% of body weight, increasing physical activity to a goal of 150 minutes a week at moderate intensity.  Advised to maintain a diet low on simple and processed carbohydrates.  He may also be a candidate for pharmacoprophylaxis with metformin or incretin mimetic.

## 2024-11-30 NOTE — Assessment & Plan Note (Signed)
 Management is ongoing with significant progress. He has lost six pounds since the last visit, with a reduction in body fat percentage from 42% to 39% and visceral fat from 30% to 25%. He is walking two miles a day and making healthier dietary choices, including reducing sodium intake and opting for plant-based protein shakes without artificial sweeteners. He reports improved energy levels and adequate appetite control, consuming approximately 1200 calories per day. No current need for pharmacological intervention as he is managing well with lifestyle modifications. - Continue current dietary and exercise regimen. - Scheduled follow-up appointment in four weeks for accountability and progress check. - Consider using a weighted vest for increased exercise intensity.

## 2024-11-30 NOTE — Assessment & Plan Note (Signed)
 Moderate patient.  Continue PAP therapy.  Losing 15% of body weight may reduce AHI.  He may also be a candidate for GLP-1 aided weight management prophylactic with continue with nutritional behavioral strategies.

## 2024-12-08 ENCOUNTER — Other Ambulatory Visit: Payer: Self-pay | Admitting: Family

## 2024-12-13 ENCOUNTER — Encounter: Payer: Self-pay | Admitting: Family Medicine

## 2024-12-13 ENCOUNTER — Ambulatory Visit: Admitting: Family Medicine

## 2024-12-13 DIAGNOSIS — M479 Spondylosis, unspecified: Secondary | ICD-10-CM

## 2024-12-13 DIAGNOSIS — I7121 Aneurysm of the ascending aorta, without rupture: Secondary | ICD-10-CM | POA: Diagnosis not present

## 2024-12-13 DIAGNOSIS — Z87891 Personal history of nicotine dependence: Secondary | ICD-10-CM | POA: Diagnosis not present

## 2024-12-13 DIAGNOSIS — R7303 Prediabetes: Secondary | ICD-10-CM

## 2024-12-13 DIAGNOSIS — E7800 Pure hypercholesterolemia, unspecified: Secondary | ICD-10-CM

## 2024-12-13 DIAGNOSIS — Z86711 Personal history of pulmonary embolism: Secondary | ICD-10-CM | POA: Insufficient documentation

## 2024-12-13 DIAGNOSIS — I82409 Acute embolism and thrombosis of unspecified deep veins of unspecified lower extremity: Secondary | ICD-10-CM | POA: Diagnosis not present

## 2024-12-13 DIAGNOSIS — G4733 Obstructive sleep apnea (adult) (pediatric): Secondary | ICD-10-CM | POA: Diagnosis not present

## 2024-12-13 DIAGNOSIS — I10 Essential (primary) hypertension: Secondary | ICD-10-CM | POA: Diagnosis not present

## 2024-12-13 NOTE — Progress Notes (Signed)
 "   Patient ID: Russell Garrett, male    DOB: 1951/08/09, 74 y.o.   MRN: [id number]  This visit was conducted in person.  BP 130/70   Pulse 67   Temp 99.3 F (37.4 C) (Temporal)   Ht 6' 0.5 (1.842 m)   Wt 285 lb 4 oz (129.4 kg)   SpO2 95%   BMI 38.16 kg/m    CC:  Chief Complaint  Patient presents with   Establish Care    TOC from Dr. Jimmy    Subjective:   HPI: Russell Garrett is a 74 y.o. male presenting on 12/13/2024 for Establish Care (TOC from Dr. Jimmy)   Past PCP: Jimmy  Last CPX: 05/2024     Elevated Cholesterol:  Has aortic atherosclerosis... ON  atorvastatin  40 mg daily. Lab Results  Component Value Date   CHOL 168 06/20/2024   HDL 45.30 06/20/2024   LDLCALC 105 (H) 06/20/2024   LDLDIRECT 161.3 02/03/2011   TRIG 88.0 06/20/2024   CHOLHDL 4 06/20/2024  The 10-year ASCVD risk score (Arnett DK, et al., 2019) is: 25.1%   Values used to calculate the score:     Age: 79 years     Clinically relevant sex: Male     Is Non-Hispanic African American: No     Diabetic: No     Tobacco smoker: No     Systolic Blood Pressure: 130 mmHg     Is BP treated: Yes     HDL Cholesterol: 45.3 mg/dL     Total Cholesterol: 168 mg/dL Using medications without problems: none Muscle aches:  none   Hypertension:  Well-controlled on hydrochlorothiazide  12.5 mg daily, losartan  100 mg daily, metoprolol  XL 25 mg daily BP Readings from Last 3 Encounters:  12/13/24 130/70  11/29/24 129/72  10/26/24 116/70  Using medication without problems or lightheadedness:  Chest pain with exertion: Edema: Short of breath: Average home BPs: Other issues: Cardiologist Dr. Delford  Prediabetes:  Lab Results  Component Value Date   HGBA1C 5.7 (H) 08/29/2024    OSA: On CPAP Dr.Desai.   History of recurrent DVT: On Eliquis  2.5 mg twice daily.   History of thoracic aortic aneurysm:; Reviewed last office visit September 13, 2024  Stable CTA of chest: 4.7 cm, stable in size, plan following with 6  months CTA chest  Dr. Kerrin. Hydrochlorothiazide  12.5 mg  Losartan  100 mg daily  Morbid obesity followed by Dr. Francyne at [facility]  1500 calories a day, high protein,low carb.  He has lost 40 lbs .  Exercising: walking daily 2-3 miles. Wt Readings from Last 3 Encounters:  12/13/24 285 lb 4 oz (129.4 kg)  11/29/24 283 lb (128.4 kg)  10/26/24 289 lb (131.1 kg)   Former smoker and lung cancer screening program.  Relevant past medical, surgical, family and social history reviewed and updated as indicated. Interim medical history since our last visit reviewed. Allergies and medications reviewed and updated. Outpatient Medications Prior to Visit  Medication Sig Dispense Refill   apixaban  (ELIQUIS ) 2.5 MG TABS tablet Take 1 tablet (2.5 mg total) by mouth 2 (two) times daily. 60 tablet 11   atorvastatin  (LIPITOR) 40 MG tablet TAKE 1 TABLET BY MOUTH ONCE DAILY 90 tablet 0   hydrochlorothiazide  (HYDRODIURIL ) 12.5 MG tablet Take 1 tablet (12.5 mg total) by mouth daily. 90 tablet 3   losartan  (COZAAR ) 100 MG tablet Take 1 tablet (100 mg total) by mouth daily. 90 tablet 3   metoprolol  succinate (  TOPROL -XL) 25 MG 24 hr tablet Take 25 mg by mouth daily.     OVER THE COUNTER MEDICATION Elderberry/Vitamin C/Zinc     metoprolol  succinate (TOPROL -XL) 25 MG 24 hr tablet TAKE 1 TABLET BY MOUTH ONCE DAILY MUST KEEP APPOINTMENT 04/28/24 FOR MORE REFILLS 90 tablet 1   No facility-administered medications prior to visit.     Per HPI unless specifically indicated in ROS section below Review of Systems  Constitutional:  Negative for fatigue and fever.  HENT:  Negative for ear pain.   Eyes:  Negative for pain.  Respiratory:  Negative for cough and shortness of breath.   Cardiovascular:  Negative for chest pain, palpitations and leg swelling.  Gastrointestinal:  Negative for abdominal pain.  Genitourinary:  Negative for dysuria.  Musculoskeletal:  Negative for arthralgias.   Neurological:  Negative for syncope, light-headedness and headaches.  Psychiatric/Behavioral:  Negative for dysphoric mood.    Objective:  BP 130/70   Pulse 67   Temp 99.3 F (37.4 C) (Temporal)   Ht 6' 0.5 (1.842 m)   Wt 285 lb 4 oz (129.4 kg)   SpO2 95%   BMI 38.16 kg/m   Wt Readings from Last 3 Encounters:  12/13/24 285 lb 4 oz (129.4 kg)  11/29/24 283 lb (128.4 kg)  10/26/24 289 lb (131.1 kg)      Physical Exam Constitutional:      Appearance: He is well-developed.  HENT:     Head: Normocephalic.     Right Ear: Hearing normal.     Left Ear: Hearing normal.     Nose: Nose normal.  Neck:     Thyroid : No thyroid  mass or thyromegaly.     Vascular: No carotid bruit.     Trachea: Trachea normal.  Cardiovascular:     Rate and Rhythm: Normal rate and regular rhythm.     Pulses: Normal pulses.     Heart sounds: Heart sounds not distant. No murmur heard.    No friction rub. No gallop.     Comments: No peripheral edema Pulmonary:     Effort: Pulmonary effort is normal. No respiratory distress.     Breath sounds: Normal breath sounds.  Skin:    General: Skin is warm and dry.     Findings: No rash.  Psychiatric:        Speech: Speech normal.        Behavior: Behavior normal.        Thought Content: Thought content normal.       Results for orders placed or performed in visit on 08/29/24  Vitamin B12   Collection Time: 08/29/24 10:45 AM  Result Value Ref Range   Vitamin B-12 476 232 - 1,245 pg/mL  Hemoglobin A1c   Collection Time: 08/29/24 10:45 AM  Result Value Ref Range   Hgb A1c MFr Bld 5.7 (H) 4.8 - 5.6 %   Est. average glucose Bld gHb Est-mCnc 117 mg/dL  Insulin , random   Collection Time: 08/29/24 10:45 AM  Result Value Ref Range   INSULIN  33.0 (H) 2.6 - 24.9 uIU/mL  TSH   Collection Time: 08/29/24 10:45 AM  Result Value Ref Range   TSH 2.720 0.450 - 4.500 uIU/mL  VITAMIN D  25 Hydroxy (Vit-D Deficiency, Fractures)   Collection Time: 08/29/24 10:45 AM   Result Value Ref Range   Vit D, 25-Hydroxy 55.1 30.0 - 100.0 ng/mL  Glucose, fasting   Collection Time: 08/29/24 10:45 AM  Result Value Ref Range   Glucose, Plasma 92  70 - 99 mg/dL  CRP High sensitivity   Collection Time: 08/29/24 10:45 AM  Result Value Ref Range   CRP, High Sensitivity 0.75 0.00 - 3.00 mg/L    Assessment and Plan  Morbid obesity (HCC) Assessment & Plan: BMI 38 with associated comorbidity of hypertension, high cholesterol  Followed at [facility] by Dr. Francyne   Prediabetes Assessment & Plan: Most recent A1c is  Lab Results  Component Value Date   HGBA1C 5.7 (H) 08/29/2024     Former smoker Assessment & Plan:  Quit 2017..In lung cancer screening program.   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  Pulmonologist has recently decreased eliquis  2.5 BID dosing to    Aneurysm of ascending aorta without rupture Assessment & Plan:  Stable CT A chest every 6 months..  last done 08/2024  Followed By Dr. Kerrin.   OBSTRUCTIVE SLEEP APNEA Assessment & Plan:  On CPAP   Essential hypertension, benign Assessment & Plan: Stable, chronic.  Continue current medication.     Pure hypercholesterolemia Assessment & Plan: Has aortic atherosclerosis... On  atorvastatin  40 mg daily.   Osteoarthritis of back Assessment & Plan:  Minimal low back pain.      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   Greig Ring, MD  "

## 2024-12-13 NOTE — Assessment & Plan Note (Addendum)
Continues on eliquis 2.5mg  bid

## 2024-12-13 NOTE — Assessment & Plan Note (Signed)
"   2015.  Pulmonologist has recently decreased eliquis  2.5 BID dosing to  "

## 2024-12-13 NOTE — Assessment & Plan Note (Signed)
 Has aortic atherosclerosis... On  atorvastatin  40 mg daily.

## 2024-12-13 NOTE — Assessment & Plan Note (Signed)
 BMI 38 with associated comorbidity of hypertension, high cholesterol  Followed at [facility] by Dr. Francyne

## 2024-12-13 NOTE — Assessment & Plan Note (Addendum)
"   Quit 2017..In lung cancer screening program. "

## 2024-12-13 NOTE — Assessment & Plan Note (Signed)
 Most recent A1c is  Lab Results  Component Value Date   HGBA1C 5.7 (H) 08/29/2024

## 2024-12-13 NOTE — Assessment & Plan Note (Signed)
-   On CPAP.

## 2024-12-13 NOTE — Assessment & Plan Note (Signed)
"   Minimal low back pain.  "

## 2024-12-13 NOTE — Assessment & Plan Note (Signed)
"   Stable CT A chest every 6 months..  last done 08/2024  Followed By Dr. Kerrin. "

## 2024-12-13 NOTE — Assessment & Plan Note (Signed)
 Stable, chronic.  Continue current medication.

## 2024-12-26 ENCOUNTER — Ambulatory Visit (INDEPENDENT_AMBULATORY_CARE_PROVIDER_SITE_OTHER): Admitting: Internal Medicine

## 2025-01-04 ENCOUNTER — Ambulatory Visit (INDEPENDENT_AMBULATORY_CARE_PROVIDER_SITE_OTHER): Admitting: Internal Medicine

## 2025-05-15 ENCOUNTER — Ambulatory Visit

## 2025-05-23 ENCOUNTER — Ambulatory Visit

## 2025-05-25 ENCOUNTER — Ambulatory Visit

## 2025-06-14 ENCOUNTER — Other Ambulatory Visit

## 2025-06-21 ENCOUNTER — Encounter: Admitting: Family Medicine
